# Patient Record
Sex: Male | Born: 1950
Health system: Southern US, Community
[De-identification: ages and names within clinical notes are randomized; demographics above are authoritative.]

## PROBLEM LIST (undated history)

## (undated) ENCOUNTER — Emergency Department (HOSPITAL_COMMUNITY): Discharge status: Against Medical Advice | Payer: BC Managed Care – PPO

## (undated) DIAGNOSIS — D573 Sickle-cell trait: Secondary | ICD-10-CM

## (undated) DIAGNOSIS — K219 Gastro-esophageal reflux disease without esophagitis: Secondary | ICD-10-CM

## (undated) DIAGNOSIS — Z87442 Personal history of urinary calculi: Secondary | ICD-10-CM

## (undated) DIAGNOSIS — E119 Type 2 diabetes mellitus without complications: Secondary | ICD-10-CM

## (undated) DIAGNOSIS — I10 Essential (primary) hypertension: Secondary | ICD-10-CM

## (undated) DIAGNOSIS — K7581 Nonalcoholic steatohepatitis (NASH): Secondary | ICD-10-CM

## (undated) DIAGNOSIS — N200 Calculus of kidney: Secondary | ICD-10-CM

## (undated) DIAGNOSIS — K589 Irritable bowel syndrome without diarrhea: Secondary | ICD-10-CM

## (undated) DIAGNOSIS — B1920 Unspecified viral hepatitis C without hepatic coma: Secondary | ICD-10-CM

## (undated) DIAGNOSIS — N289 Disorder of kidney and ureter, unspecified: Secondary | ICD-10-CM

## (undated) DIAGNOSIS — G4700 Insomnia, unspecified: Secondary | ICD-10-CM

## (undated) HISTORY — PX: BACK SURGERY: SHX140

## (undated) HISTORY — DX: Sickle-cell trait: D57.3

## (undated) HISTORY — DX: Gastro-esophageal reflux disease without esophagitis: K21.9

## (undated) HISTORY — DX: Type 2 diabetes mellitus without complications: E11.9

## (undated) HISTORY — PX: KIDNEY STONE SURGERY: SHX686

## (undated) HISTORY — DX: Insomnia, unspecified: G47.00

## (undated) HISTORY — DX: Nonalcoholic steatohepatitis (NASH): K75.81

## (undated) HISTORY — PX: SHOULDER SURGERY: SHX246

## (undated) HISTORY — DX: Irritable bowel syndrome, unspecified: K58.9

## (undated) HISTORY — DX: Unspecified viral hepatitis C without hepatic coma: B19.20

## (undated) HISTORY — PX: CHOLECYSTECTOMY: SHX55

## (undated) HISTORY — DX: Calculus of kidney: N20.0

---

## 2000-10-24 ENCOUNTER — Ambulatory Visit (HOSPITAL_COMMUNITY): Admission: RE | Admit: 2000-10-24 | Discharge: 2000-10-24 | Payer: Self-pay | Admitting: Internal Medicine

## 2000-10-24 ENCOUNTER — Encounter: Payer: Self-pay | Admitting: Internal Medicine

## 2000-10-25 ENCOUNTER — Emergency Department (HOSPITAL_COMMUNITY): Admission: EM | Admit: 2000-10-25 | Discharge: 2000-10-25 | Payer: Self-pay | Admitting: Emergency Medicine

## 2000-10-25 ENCOUNTER — Encounter: Payer: Self-pay | Admitting: Emergency Medicine

## 2000-10-27 ENCOUNTER — Encounter: Payer: Self-pay | Admitting: General Surgery

## 2000-10-27 ENCOUNTER — Ambulatory Visit (HOSPITAL_COMMUNITY): Admission: RE | Admit: 2000-10-27 | Discharge: 2000-10-27 | Payer: Self-pay | Admitting: Pediatrics

## 2000-11-19 ENCOUNTER — Encounter: Payer: Self-pay | Admitting: General Surgery

## 2000-11-19 ENCOUNTER — Ambulatory Visit (HOSPITAL_COMMUNITY): Admission: RE | Admit: 2000-11-19 | Discharge: 2000-11-19 | Payer: Self-pay | Admitting: Pediatrics

## 2000-11-24 ENCOUNTER — Observation Stay (HOSPITAL_COMMUNITY): Admission: RE | Admit: 2000-11-24 | Discharge: 2000-11-25 | Payer: Self-pay | Admitting: Pediatrics

## 2001-02-27 ENCOUNTER — Emergency Department (HOSPITAL_COMMUNITY): Admission: EM | Admit: 2001-02-27 | Discharge: 2001-02-27 | Payer: Self-pay | Admitting: Internal Medicine

## 2002-02-05 ENCOUNTER — Ambulatory Visit (HOSPITAL_COMMUNITY): Admission: RE | Admit: 2002-02-05 | Discharge: 2002-02-05 | Payer: Self-pay | Admitting: Internal Medicine

## 2002-02-05 HISTORY — PX: COLONOSCOPY: SHX174

## 2003-05-23 ENCOUNTER — Ambulatory Visit (HOSPITAL_COMMUNITY): Admission: RE | Admit: 2003-05-23 | Discharge: 2003-05-23 | Payer: Self-pay | Admitting: Family Medicine

## 2003-07-04 ENCOUNTER — Other Ambulatory Visit: Admission: RE | Admit: 2003-07-04 | Discharge: 2003-07-04 | Payer: Self-pay | Admitting: Dermatology

## 2005-01-17 ENCOUNTER — Emergency Department (HOSPITAL_COMMUNITY): Admission: EM | Admit: 2005-01-17 | Discharge: 2005-01-17 | Payer: Self-pay | Admitting: Emergency Medicine

## 2005-01-19 ENCOUNTER — Inpatient Hospital Stay (HOSPITAL_COMMUNITY): Admission: EM | Admit: 2005-01-19 | Discharge: 2005-01-23 | Payer: Self-pay | Admitting: Emergency Medicine

## 2005-04-02 ENCOUNTER — Ambulatory Visit: Payer: Self-pay | Admitting: Internal Medicine

## 2005-05-14 ENCOUNTER — Ambulatory Visit: Payer: Self-pay | Admitting: Internal Medicine

## 2005-05-17 ENCOUNTER — Ambulatory Visit: Payer: Self-pay | Admitting: Internal Medicine

## 2005-05-17 ENCOUNTER — Ambulatory Visit (HOSPITAL_COMMUNITY): Admission: RE | Admit: 2005-05-17 | Discharge: 2005-05-17 | Payer: Self-pay | Admitting: Internal Medicine

## 2005-05-17 HISTORY — PX: ESOPHAGOGASTRODUODENOSCOPY: SHX1529

## 2005-08-26 ENCOUNTER — Ambulatory Visit: Payer: Self-pay | Admitting: Internal Medicine

## 2005-10-02 ENCOUNTER — Ambulatory Visit: Payer: Self-pay | Admitting: Internal Medicine

## 2006-08-08 ENCOUNTER — Ambulatory Visit (HOSPITAL_COMMUNITY): Admission: RE | Admit: 2006-08-08 | Discharge: 2006-08-08 | Payer: Self-pay | Admitting: Family Medicine

## 2006-10-31 ENCOUNTER — Ambulatory Visit: Payer: Self-pay | Admitting: Internal Medicine

## 2008-01-13 ENCOUNTER — Ambulatory Visit (HOSPITAL_COMMUNITY): Admission: RE | Admit: 2008-01-13 | Discharge: 2008-01-13 | Payer: Self-pay | Admitting: Internal Medicine

## 2008-03-13 ENCOUNTER — Emergency Department (HOSPITAL_COMMUNITY): Admission: EM | Admit: 2008-03-13 | Discharge: 2008-03-13 | Payer: Self-pay | Admitting: Emergency Medicine

## 2008-08-23 ENCOUNTER — Ambulatory Visit (HOSPITAL_COMMUNITY): Admission: RE | Admit: 2008-08-23 | Discharge: 2008-08-23 | Payer: Self-pay | Admitting: Urology

## 2008-08-25 ENCOUNTER — Ambulatory Visit (HOSPITAL_COMMUNITY): Admission: RE | Admit: 2008-08-25 | Discharge: 2008-08-25 | Payer: Self-pay | Admitting: Urology

## 2008-08-25 ENCOUNTER — Encounter (INDEPENDENT_AMBULATORY_CARE_PROVIDER_SITE_OTHER): Payer: Self-pay | Admitting: Urology

## 2010-08-04 ENCOUNTER — Encounter: Payer: Self-pay | Admitting: Family Medicine

## 2010-10-30 LAB — CBC
MCV: 89.6 fL (ref 78.0–100.0)
Platelets: 193 10*3/uL (ref 150–400)
RBC: 5 MIL/uL (ref 4.22–5.81)
RDW: 12.6 % (ref 11.5–15.5)

## 2010-10-30 LAB — BASIC METABOLIC PANEL
Creatinine, Ser: 1.65 mg/dL — ABNORMAL HIGH (ref 0.4–1.5)
GFR calc non Af Amer: 43 mL/min — ABNORMAL LOW (ref >60)
Sodium: 137 mEq/L (ref 135–145)

## 2010-10-30 LAB — STONE ANALYSIS: Stone Weight KSTONE: 0.2 g

## 2010-11-27 NOTE — Op Note (Signed)
NAME:  Stephen Stephenson, HALLENBECK NO.:  [id number]   MEDICAL RECORD NO.:  [id number]          PATIENT TYPE:  AMB   LOCATION:  DAY                           FACILITY:  [facility]   PHYSICIAN:  Miguel Dibble, M.D.   DATE OF BIRTH:  1951/04/30   DATE OF PROCEDURE:  08/25/2008  DATE OF DISCHARGE:                               OPERATIVE REPORT   PREOPERATIVE DIAGNOSES:  Left distal ureteral calculus with obstruction,  left hydronephrosis, left renal colic.   POSTOPERATIVE DIAGNOSES:  Left distal ureteral calculus with  obstruction, left hydronephrosis, left renal colic.   OPERATIVE PROCEDURES:  Cystoscopy, left retrograde pyelogram, left  ureteroscopy, stone extraction, and left ureteral stent placement.   ANESTHESIA:  General.   SURGEON:  Miguel Dibble, MD   COMPLICATIONS:  None.   ESTIMATED BLOOD LOSS:  Minimal.   DRAINS:  6.26 cm size left ureteral stent with a string.   SPECIMEN:  Left ureteral calculus which was sent for chemical analysis.   INDICATIONS FOR THE PROCEDURE:  This 60 year old male has a history of  recurrent urolithiasis.  He was evaluated for abdominal left flank pain  recently.  His x-rays revealed a 9 x 6 mm size left distal ureteral  calculus with obstruction of hydronephrosis.  The patient was unable to  pass the stone.  He was taken to the operating room today for  cystoscopy, left retrograde pyelogram, left ureteroscopy, stone  extraction with possible holmium laser and left ureteral stent  placement.   DESCRIPTION OF THE PROCEDURE:  General anesthesia was induced and the  patient was placed on the OR table in the dorsal lithotomy position.  The lower abdomen and genitalia were prepped and draped in a sterile  fashion.  Cystoscopy was done with the 20-French scope.  The urethra was  normal.  The prostate was normal.  The bladder was examined and found to  be normal.  There was prominence of left intramural ureter suggestive of  an underlying  stone.  A 5-French wedge catheter was then placed in the  left ureteral orifice.  About 7 mL of indigo carmine was injected to the  collecting system and the left retrograde pyelogram was done with C-arm  fluoroscopy.  This revealed a large filling defect in the intramural  ureter about 9 x 6 mm in size.  There is proximal hydroureter and  hydronephrosis.   A 5-French open-ended catheter was then placed in the left ureteral  orifice.  A 0.038-gauge Bentson guidewire could not be passed beyond the  level of the stone.  A Glidewire could be passed into the upper  collecting system.  The open-ended catheter was then advanced to the  level above the stone.  The Glidewire was then replaced with a Bentson  guidewire.  Distal ureter was then dilated using a 18-French balloon  dilating catheter.  The balloon dilating catheter was then removed  leaving the guidewire in place.   Ureteroscopy was done with a 8-point French rigid ureteroscope.  A 9 mm  size stone was seen in the distal ureter.  The ureter was found  to be  dilated adequately.  A nitinol 0 wire basket was reinserted into the  left distal ureter.  The stone was engaged in the basket and removed  without any difficulty.  Examination of ureter up to the pelvic brim was  negative.  A 6.26 cm size stent was then inserted into the left  collecting system.  The instruments were removed.  The patient was  transferred to the PACU in a satisfactory condition.      Miguel Dibble, M.D.  Electronically Signed     SK/MEDQ  D:  08/25/2008  T:  08/26/2008  Job:  [id number]   cc:   Leonides Grills, M.D.  Fax: [phone]

## 2010-11-27 NOTE — H&P (Signed)
NAME:  SAHAJ, BONA NO.:  [id number]   MEDICAL RECORD NO.:  [id number]          PATIENT TYPE:  AMB   LOCATION:  DAY                           FACILITY:  [facility]   PHYSICIAN:  Miguel Dibble, M.D.   DATE OF BIRTH:  04/10/1951   DATE OF ADMISSION:  08/25/2008  DATE OF DISCHARGE:  LH                              HISTORY & PHYSICAL   He is coming to the short-stay center on August 25, 2008.   CHIEF COMPLAINT:  Left distal ureteral calculus with obstruction,  abdominal pain, left hydronephrosis.   HISTORY OF PRESENT ILLNESS:  This 60 year old male has a history of  recurrent urolithiasis in the past.  He has undergone ureteroscopy and  stone extraction in 2006.   He has been having intermittent bilateral flank pain of moderate  severity for about a week.  He also has sharp diffuse abdominal pain as  well as nausea.  There is no history of voiding difficulty or hematuria.  A noncontrast CT scan of abdomen and pelvis revealed an 8-mm size left  distal ureteral calculus obstruction and left hydronephrosis and  hydroureter.  The patient was brought to the short-stay center today for  cystoscopy, left retrograde pyelogram, left ureteroscopy, holmium laser  lithotripsy, stone extraction and stent placement.   PAST MEDICAL HISTORY:  1. Recurrent urolithiasis, status post ureteroscopy and stone      extraction in 2006.  2. Diabetes mellitus.  3. Status post back surgery.  4. Status post cholecystectomy.  5. Status post shoulder surgery.   MEDICATIONS:  Glimepiride 2 mg p.o. daily   ALLERGIES:  None.   EXAMINATION:  Height 6 feet, weight 220 pounds.  HEAD, EYES, EARS, NOSE AND THROAT:  Normal.  LUNGS:  Clear to auscultation.  HEART:  Regular rate and rhythm.  No murmurs.  ABDOMEN:  Soft, no palpable flank mass or CVA tenderness.  Bladder is  not palpable.  Penis and testes are normal.  RECTAL EXAMINATION:  30 g essentially benign prostate.   CT scan of abdomen  and pelvis without contrast revealed chronic  perirenal stranding on the right side.  There was no obstruction.  Left  hydronephrosis and hydroureter due to obstructing 8-mm size stone in the  distal ureter at the level of ureterovesical junction was noted.   IMPRESSION:  1. Left distal ureteral calculus with obstruction.  2. Bilateral flank pain.  3. Left hydronephrosis and hydroureter.   PLAN:  Cystoscopy, left retrograde pyelogram, left ureteroscopy, holmium  laser lithotripsy, stone extraction and stent placement in short-stay  center at [facility].  I have discussed with the patient  regarding the diagnosis, operative details, alternative treatments,  outcome, possible risks and complications, and he has agreed for the  procedure to be done.      Miguel Dibble, M.D.  Electronically Signed     SK/MEDQ  D:  08/25/2008  T:  08/25/2008  Job:  [id number]   cc:   Leonides Grills, M.D.  Fax: [phone]

## 2010-11-30 NOTE — Op Note (Signed)
NAME:  Stephen Stephenson, Stephen Stephenson NO.:  [id number]   MEDICAL RECORD NO.:  [id number]          PATIENT TYPE:  INP   LOCATION:  A326                          FACILITY:  [facility]   PHYSICIAN:  Miguel Dibble, M.D.   DATE OF BIRTH:  Sep 08, 1950   DATE OF PROCEDURE:  01/21/2005  DATE OF DISCHARGE:                                 OPERATIVE REPORT   PREOPERATIVE DIAGNOSES:  1.  Left distal ureteral calculus with obstruction.  2.  Left renal colic.  3.  Left hydronephrosis.   POSTOPERATIVE DIAGNOSES:  1.  Left distal ureteral calculus with obstruction.  2.  Left renal colic.  3.  Left hydronephrosis.   OPERATIVE PROCEDURES:  1.  Cystoscopy.  2.  Left retrograde pyelogram.  3.  Left ureteroscopy.  4.  Stone extraction.  5.  Left ureteral stent placement.   ANESTHESIA:  General.   SURGEON:  Miguel Dibble, M.D.   COMPLICATIONS:  None.   INDICATIONS FOR PROCEDURE:  This 60 year old male was admitted to hospital  with severe left flank pain. Evaluation revealed a 2-mm sized left distal  ureteral calculi, with obstruction and mild hydronephrosis. The patient was  unable to pass the stone and he had severe persistent pain. He was taken to  the OR today for cystoscopy, left retrograde pyelogram, left ureteroscopy,  stone extraction and left ureteral stent placement.   DESCRIPTION OF PROCEDURE:  General anesthesia was induced and the patient  was placed on the OR table in the dorsolithotomy position. The lower abdomen  and genitalia were prepped and draped in a sterile fashion. Cystoscopy was  done with a 24-French scope. The urethra, prostate and bladder were normal.  A 5-French catheter was then placed in left ureteral orifice. Then 7 cc of  Renograffin was injected into the collecting system and a retrograde  pyelogram was done. This revealed a small filling defect in the left  intramural ureter with proximal hydroureter.   The 5-French open-ended catheter was then placed the  left ureteral orifice.  I was unable to pass a 0.038 inch Britta Mccreedy guidewire due to the stone  obstructing the distal ureter.   The cystoscope was removed. A rigid 8.5-French ureteroscope was then  inserted into the bladder. Under direct vision in the distal ureter, the  guidewire was passed into the upper collecting system. Ureteroscope was then  removed. The distal ureter was then dilated using a  15-French balloon  dilating catheter. Balloon dilating catheter was then removed, leaving the  guidewire in place.   The ureteroscope was inserted into the distal ureter. The stone was seen in  the intramural ureter. The stone was extracted with a Nitinol wire basket  without any difficulty. A 6-French 26 cm stent with a string was then  inserted to the left collecting system. The instruments were removed. The  patient was transferred to the PACU in a satisfactory condition.       SK/MEDQ  D:  01/21/2005  T:  01/21/2005  Job:  [id number]

## 2010-11-30 NOTE — Op Note (Signed)
NAME:  CORNELLIUS, KROPP                ACCOUNT NO.:  [id number]   MEDICAL RECORD NO.:  [id number]          PATIENT TYPE:  AMB   LOCATION:  DAY                           FACILITY:  [facility]   PHYSICIAN:  R. Garfield Cornea, M.D. DATE OF BIRTH:  1951-06-21   DATE OF PROCEDURE:  05/17/2005  DATE OF DISCHARGE:                                 OPERATIVE REPORT   PROCEDURE:  Diagnostic esophagogastroduodenoscopy.   INDICATIONS FOR PROCEDURE:  The patient is a 60 year old gentleman with long-  standing gastroesophageal reflux disease symptoms, only moderately  controlled with Aciphex 20 mg orally daily.   He is a good 25 pounds over his ideal body weight. EGD is now being done to  further evaluate for potential complicated GERD. This approach has been  discussed with the patient at length. Potential risks, benefits, and  alternatives have been reviewed and questions answered. Please see  documentation in the medical record.   PROCEDURE NOTE:  O2 saturation, blood pressure, pulse, and respirations were  monitored throughout the entire procedure. Conscious sedation with Versed 4  mg IV and Demerol 100 mg IV in divided doses. Cetacaine spray for topical  oropharyngeal anesthesia.   INSTRUMENT:  Olympus video chip system.   FINDINGS:  Examination of the tubular esophagus revealed no mucosa  abnormalities. EG junction was easily traversed.   Stomach:  Gastric cavity was empty and insufflated well with air. Thorough  examination of gastric mucosa including retroflexed view of the proximal  stomach and esophagogastric junction demonstrated minimal snake-skinning,  reticulated pattern of the mucosa along the greater curvature. There was no  infiltrating process apparent. No ulcer or other abnormality. There was no  hernia. Pylorus patent and easily traversed. Examination of bulb and second  portion revealed no abnormalities.   THERAPEUTIC/DIAGNOSTIC MANEUVERS:  None.   The patient tolerated the  procedure well and was reactive to endoscopy.   IMPRESSION:  Normal esophagus. Minimal snake-skinning, reticulated pattern  of the mucosa in the area of the greater curvature. A nonspecific finding,  but I doubt this gentleman has advanced chronic liver disease and do not  believe this is indicative of portal gastropathy or any other significant  process. The remainder of the gastric mucosa appeared normal. Patent  pylorus. Normal D1 and D2.   RECOMMENDATIONS:  1.  Weight loss.  2.  Anti-reflux measures/literature provided to Mr. Creary.  3.  Increase Aciphex to 20 mg orally twice daily, before breakfast and      supper.  4.  Followup appointment with [place] in three months.      Bridgette Habermann, M.D.  Electronically Signed     RMR/MEDQ  D:  05/17/2005  T:  05/17/2005  Job:  [id number]   cc:   Halford Chessman, M.D.  Fax: [phone]

## 2010-11-30 NOTE — Op Note (Signed)
NAME:  Stephen Stephenson, Stephen Stephenson NO.:  [id number]   MEDICAL RECORD NO.:  [id number]                   PATIENT TYPE:  AMB   LOCATION:                                       FACILITY:  [facility]   PHYSICIAN:  Cristopher Estimable. Rourk, M.D.               DATE OF BIRTH:  10-Sep-1950   DATE OF PROCEDURE:  02/05/2002  DATE OF DISCHARGE:                                  PROCEDURE NOTE   PROCEDURE:  Diagnostic Colonoscopy   INDICATIONS FOR PROCEDURE:  The patient is a 60 year old gentleman with  chronic hepatitis C infection currently undergoing antiviral therapy. He  noticed some gross blood per rectum recently.  He typically moves his bowels  once a day only he has not had chronic hematochezia, although he had one  noticeable episode recently.  Colonoscopy is now being done to further  evaluate his symptoms.  This approach has been discussed with [name]  previously and again today at the bedside. The potential risks, benefits,  and alternatives have been reviewed, questions answered and he is agreeable.  Please see my documentation in the medical record for more information.  He  is low risk for conscious sedation.   PROCEDURE NOTE:  O2 saturation, blood pressure, pulse and respirations were  monitored throughout the entire procedure.   CONSCIOUS SEDATION:  Versed 4 mg IV in divided doses, Demerol 25 mg IV   INSTRUMENT USED:  Olympus video chip colonoscope   FINDINGS:  Digital rectal examination revealed no abnormalities.   ENDOSCOPIC FINDINGS:  The prep was good.   RECTUM:  Examination of rectal mucosa including retroflexed view at anal  verge and on colonoscopic exam demonstrated anal canal hemorrhoids,  otherwise the rectum mucosa appeared normal.   COLON:  Colonic mucosa was surveyed from the rectosigmoid junction thru the  left, transverse, and right colon to the area of the appendiceal orifice and  ileocecal valve and cecum.  These structures were well-seen,  photographed  for record, there was a tiny 3 mm AVM in the cecum, however, the remainder  of the colonic mucosa appeared  normal.  The terminal ileum was intubated at  10 cm.  This segment of the GI tract appeared normal from the level of the  cecum to the ileocecal valve.  The scope was slowly withdrawn.  All  previously mentioned mucosal surfaces were again seen.  No other  abnormalities were observed.  The patient tolerated the procedure well and  was reactivated.   ENDOSCOPY IMPRESSION:  1. Anal canal hemorrhoids, otherwise normal rectum.  2. Tiny arteriovenous malformation in the cecum, I doubt no clinical     significance; otherwise, the colonic mucosa appeared normal.  Normal     terminal ileum.  I suspect patient bled from hemorrhoids presently.    RECOMMENDATIONS:  1. Hemorrhoid literature was provided to Mr. Ran.  2. A 10-day course  of Anusol HC suppositories, one per rectum at bedtime x     10 days.  3. Follow up with me in the next couple of months as scheduled for     monitoring of his anti-HCV therapy.                                               Cristopher Estimable. Rourk, M.D.    RMR/MEDQ  D:  02/05/2002  T:  02/11/2002  Job:  [id number]   cc:   Virgel Paling, M.D.

## 2010-11-30 NOTE — H&P (Signed)
NAME:  Stephen Stephenson, Stephen Stephenson NO.:  [id number]   MEDICAL RECORD NO.:  [id number]          PATIENT TYPE:  INP   LOCATION:  A326                          FACILITY:  [facility]   PHYSICIAN:  Miguel Dibble, M.D.   DATE OF BIRTH:  29-Mar-1951   DATE OF ADMISSION:  01/19/2005  DATE OF DISCHARGE:  LH                                HISTORY & PHYSICAL   CHIEF COMPLAINT:  Severe left flank pain radiating to the front, nausea,  vomiting.   HISTORY OF PRESENT ILLNESS:  This 60 year old male has been experiencing  sudden onset of severe left flank pain radiating to the front for the past 3-  4 days.  This is associated with nausea and vomiting.  The patient denied  having any fevers, chills, voiding difficulty or gross hematuria.  There is  no past history of urolithiasis.  He has been to the emergency room at [facility] times two.  He has been evaluated with a noncontrast CT scan  of the abdomen and pelvis.  This revealed a 2-mm-sized left distal ureteral  calculus with obstruction and mild hydronephrosis.  The patient was unable  to pass the stone.  He had severe persistent pain and nausea.  He came back  to the emergency room on January 19, 2005.  His pain was not adequately  controlled in the emergency room.  He has been admitted to the hospital for  IV fluids, pain control and further treatment.   PAST MEDICAL HISTORY:  No medical illnesses, status post back surgery,  status post cholecystectomy, status post shoulder surgery.   MEDICATIONS:  None.   ALLERGIES:  None.   PHYSICAL EXAMINATION:  GENERAL APPEARANCE:  The patient is in moderate pain.  HEENT:  Head, eyes, ears, nose and throat are normal.  NECK:  No masses.  LUNGS:  Clear to auscultation.  HEART:  Regular rate and rhythm.  No murmurs.  ABDOMEN:  Soft.  No palpable flank mass.  Moderate left costovertebral angle  tenderness was noted.  Bladder is not      palpable.  No suprapubic tenderness.  GENITOURINARY:   Penis and testes are normal.  RECTAL:  Examination not done.   LABORATORY:  CBC:  WBC 10.3, hemoglobin 17.7, hematocrit 47.6.  BUN 22,  creatinine 1.3.  Urinalysis revealed large blood, nitrite negative,  leukocytes negative.  WBCs rare, RBCs too numerous to count.  Urine culture  and sensitivity revealed no growth.   IMPRESSION:  1.  Left distal ureteral calculus without obstruction.  2.  Left renal colic.  3.  Left hydronephrosis.   PLAN:  1.  Admit the patient to the hospital.  2.  IV fluids.  3.  Parenteral narcotics.  4.  Discussed with the patient regarding management options.  He would like      to wait for spontaneous passage of the calculus.       SK/MEDQ  D:  01/20/2005  T:  01/20/2005  Job:  [id number]

## 2010-11-30 NOTE — H&P (Signed)
NAME:  Stephen Stephenson, Stephen Stephenson                ACCOUNT NO.:  [id number]   MEDICAL RECORD NO.:  [id number]           PATIENT TYPE:  AMB   LOCATION:                                FACILITY:  [facility]   PHYSICIAN:  R. Garfield Cornea, M.D.      DATE OF BIRTH:   DATE OF ADMISSION:  DATE OF DISCHARGE:  LH                                HISTORY & PHYSICAL   CHIEF COMPLAINT:  Reflux.   HISTORY OF PRESENT ILLNESS:  [name] is a pleasant, 60 year old  gentleman with over a 5-year history of typical heartburn symptoms which  have only been somewhat blunted with Aciphex 20 mg orally daily.  He is a  good 25 pounds over his ideal body weight range.  He was having some  abdominal previously, and ended up having a kidney stones and had a stent.  He was seen by Dr. Maryland Pink.  He got a lithotripsy and now is doing well.  He does have occasional gas pains with BM.  He uses Levsin sublingually  occasionally with good results.  He had a normal colonoscopy in 2003.  He  does not have any odynophagia or dysphagia.  He has never had his upper GI  tract directly visualized. His gallbladder is out.   PAST MEDICAL HISTORY:  1.  Significant for hepatitis C, genotype 1A. Status post eradication with      antiviral therapy.  2.  History of type 2 diabetes mellitus.  3.  Gastroesophageal reflux disease.  4.  IBS.  5.  He has chronic insomnia.   PAST HISTORY:  As outlined above, he is status post shoulder surgery and  back surgery as well.   FAMILY HISTORY:  Noncontributory.   SOCIAL HISTORY:  The patient does not consume alcohol.  He does not use  tobacco products.  He is married with children.   REVIEW OF SYSTEMS:  As in history of present illness.   PHYSICAL EXAMINATION:  GENERAL:  Reveals a pleasant, 60 year old gentleman.  VITAL SIGNS:  Weight 222, height 6 feet.  Temperature 97.4, BP 124/80, pulse  92.  SKIN:  Warm and dry.  CHEST:  Lungs are clear to auscultation.  CARDIAC:  Regular rate and rhythm  without murmur, gallop, or rub.  BREASTS EXAM:  Deferred.  ABDOMEN:  Nondistended, positive bowel sounds.  Soft, nontender, without  appreciable mass or organomegaly.  EXTREMITIES:  No edema.   ADMITTING IMPRESSION:  [name] is a 31 year old gentleman with a  several year history of gastroesophageal reflux disease symptoms with only a  partial response to aspirin suppression therapy in the way of Aciphex.  Although he really does not have any alarming features he needs to have  direct visualization of his upper GI tract.  To this extent I have offered  him an EGD.  This needs to be done in the near future.  His questions were  answered.  He is agreeable.  We noted that he was a good 20-25 pounds over  his ideal body weight.  I suggested a 10-15 pound weight  loss in the next 6  months would be a great assistance in managing his gastroesophageal reflux  disease and may help his diabetes as well.   For his insomnia, he is undergoing a sleep study in the near future.  He may  have sleep apnea. Weight loss could also potentially combat that problem if  it is proven to be present.  We will make further recommendations in the  very near future.  I have refilled his Levsin sublingual today, and given  him samples of Aciphex to bridge him until the time of upper endoscopy.   I would like to thank Dr. Sharilyn Sites for allowing me to see this nice  gentleman, once again.      Bridgette Habermann, M.D.  Electronically Signed     RMR/MEDQ  D:  05/14/2005  T:  05/14/2005  Job:  [id number]   cc:   Halford Chessman, M.D.  Fax: [phone]

## 2010-11-30 NOTE — Discharge Summary (Signed)
NAME:  Stephen Stephenson, Stephen Stephenson NO.:  [id number]   MEDICAL RECORD NO.:  [id number]          PATIENT TYPE:  INP   LOCATION:  A326                          FACILITY:  [facility]   PHYSICIAN:  Miguel Dibble, M.D.   DATE OF BIRTH:  Sep 12, 1950   DATE OF ADMISSION:  01/19/2005  DATE OF DISCHARGE:  07/12/2006LH                                 DISCHARGE SUMMARY   CONSULTING PHYSICIAN:  [facility]   FINAL DIAGNOSES:  Left distal ureteral calculus.   OTHER DIAGNOSES:  Left renal colic and left hydronephrosis.   OPERATIVE PROCEDURE:  Cystoscopy, left retrograde pyelogram, left  urethroscopy with stone extraction, and left uretal stent placement done on  January 21, 2005.   COMPLICATIONS:  None.   DISCHARGE SUMMARY:  This 60 year old male was having sudden onset of severe  flank pain radiating to the front for about three to four days.  This was  associated with nausea and vomiting.  The patient denied having fevers,  chills, voiding difficulty, or gross hematuria.  There was no past history  of urolithiasis.  He had been to the emergency room at [facility]  x2.  He was evaluated with a noncontrast CT scan of abdomen and pelvis.  This revealed a 2 mm sized left distal ureteral calculus with obstruction  and mild hydronephrosis.  Patient was unable to pass the stone.  He had  severe persistent pain and nausea.  He came back to the emergency room on  January 19, 2005.  His pain was not adequately controlled in the emergency room.  He was admitted to the hospital for IV fluids, pain control, and further  treatment.   PAST MEDICAL HISTORY:  No medical illnesses.  Status post back surgery.  Status post cholecystectomy.  Status post shoulder surgery.   MEDICATIONS:  None.   ALLERGIES:  None.   PHYSICAL EXAMINATION:  GENERAL:  Patient in moderate pain.  HEENT:  Normal.  NECK:  No masses.  LUNGS:  Clear to auscultation.  HEART:  Regular rate and rhythm.  No  murmurs.  ABDOMEN:  Soft.  No palpable flank mass.  Moderate left costovertebral angle  tenderness was noted.  Bladder was not palpable.  No suprapubic tenderness.  GENITOURINARY:  Penis and testes are normal.  RECTAL:  Not done.   ADMISSION LABORATORIES:  CBC:  WBC 10.3, hemoglobin 17.7, hematocrit 47.6.  BUN 22, creatinine 1.3.  Urinalysis revealed large blood, nitrate negative,  leukocytes negative, wbc's rare, rbc's too numerous to count.  Urine culture  and sensitivity revealed no growth.   HOSPITAL COURSE:  The patient was admitted to the hospital and treated with  IV fluids and parenteral narcotics.  I discussed with the patient about the  treatment options.  As the stone is small he wanted to wait and see whether  he can pass the stone spontaneously.  He had severe intermittent pain which  was adequately controlled with PCA morphine.  The patient also had hiccups  for which he was seen by [facility].  The patient was unable  to pass the  stone.  After counseling he was taken to the OR on January 21, 2005  and underwent cystoscopy, left retrograde pyelogram, left urethroscopy with  stone extraction, and left uretal stent placement.  The patient had hiccups  and he also had left lower quadrant abdominal pain.  With the possible  diagnosis of diverticulitis he underwent evaluation of the CT of the abdomen  and pelvis with contrast.  In the postoperative period his glucose was found  to be high, 159.  He was started on Amaryl by [facility].  His pain was adequately controlled with oral pain medications and the PCA  pump was discontinued.  The CT scan of the abdomen and pelvis revealed mild  left hydronephrosis and perinephric inflammation.  There was no evidence of  diverticulitis.  Patient had a 4 x 2 mm sized left renal stone.  Left  ureteral stone was in good position.  On January 23, 2005 the patient had good  relief of pain and he was voiding without any  difficulty.  Also had relief  of nausea and abdominal discomfort.  His laboratories were within normal  range.  His glucose was 135.  Renal function was normal.  BUN 11, creatinine  0.9.  Patient was discharged on January 23, 2005.  He will return to the office  on January 25, 2005 for stent removal.   DISCHARGE MEDICATIONS:  1.  Cipro 500 mg p.o. b.i.d. for five days.  2.  Tylenol with codeine No.3 one p.o. q.8h. p.r.n. pain #20.  3.  Amaryl 200 mg one p.o. daily.   He was advised to call me for fever, chills, voiding difficulty, or gross  hematuria.  He will see [facility] for follow-up of new  onset diabetes mellitus.   CONDITION ON DISCHARGE:  Stable.   FINAL DIAGNOSES:  1.  Left distal ureteral calculus with obstruction.  2.  Left hydronephrosis.  3.  Left renal colic.  4.  New onset diabetes mellitus.       SK/MEDQ  D:  02/06/2005  T:  02/06/2005  Job:  [id number]

## 2011-05-10 ENCOUNTER — Ambulatory Visit (INDEPENDENT_AMBULATORY_CARE_PROVIDER_SITE_OTHER): Payer: BC Managed Care – PPO | Admitting: Internal Medicine

## 2011-05-10 VITALS — BP 135/89 | HR 72 | Temp 97.1°F | Ht 72.0 in | Wt 215.4 lb

## 2011-05-10 DIAGNOSIS — K219 Gastro-esophageal reflux disease without esophagitis: Secondary | ICD-10-CM

## 2011-05-10 NOTE — Patient Instructions (Signed)
Trial of Dexilant 60 mg daily  - -samples provided  Acid reflux information sheet provided.  Call me in 3-4 weeks and let me know how the Dexilant Is working for you and we will go from there.

## 2011-05-16 ENCOUNTER — Encounter: Payer: Self-pay | Admitting: Internal Medicine

## 2011-05-21 ENCOUNTER — Encounter: Payer: Self-pay | Admitting: Internal Medicine

## 2011-05-21 DIAGNOSIS — K219 Gastro-esophageal reflux disease without esophagitis: Secondary | ICD-10-CM | POA: Insufficient documentation

## 2011-05-21 NOTE — Assessment & Plan Note (Signed)
Flareup of GERD currently on no acid supression therapy. No alarm symptoms.  Recommendations: Reviewed the multipronged approach to treatment of GERD. Specifically reviewed diet.  Trial of Dexilant 60 mg daily samples provided. He will let us know how he is  doing in 3 weeks.  Plan for average risk screening colonoscopy 2013.

## 2011-05-21 NOTE — Progress Notes (Signed)
Primary Care Physician:  [facility] Primary Gastroenterologist:  Dr. Jena Gauss  Pre-Procedure History & Physical: HPI:  Stephen Stephenson is a 60 y.o. male here for followup of GERD. Here for followup. Has not been seen in some time. Reflux symptoms have acted up some typical heartburn occasionally 2-3 times weekly. No acid suppression currently. Previously did well AcipHex. No weight loss no dysphagia no melena or hematochezia. Last EGD November 2006 demonstrated normal esophagus. Last colonoscopy for paper hematochezia back in July of 2003 demonstrated anal canal hemorrhoids at that time a tiny AVM in the cecum. He will be due for routine screening 2013.  Past Medical History  Diagnosis Date  . GERD (gastroesophageal reflux disease)   . Hepatitis C     genotype 1 eradicated  . Hb-AS genotype   . DM (diabetes mellitus)     type II  . IBS (irritable bowel syndrome)   . Insomnia   . Kidney stone     Past Surgical History  Procedure Date  . Shoulder surgery   . Back surgery   . Colonoscopy 02/05/02    anal canal herrhoids ow normal/normal ileum/suspect pt bled from hemorrhoids presently  . Esophagogastroduodenoscopy 05/17/05    normal esophagus/gastric mucosa appeared normal  . Kidney stone surgery     Prior to Admission medications   Not on File    Allergies as of 05/10/2011  . (No Known Allergies)    No family history on file.  History   Social History  . Marital Status: Married    Spouse Name: N/A    Number of Children: N/A  . Years of Education: N/A   Occupational History  . Not on file.   Social History Main Topics  . Smoking status: Never Smoker   . Smokeless tobacco: Not on file  . Alcohol Use: No  . Drug Use: No  . Sexually Active:    Other Topics Concern  . Not on file   Social History Narrative  . No narrative on file    Review of Systems: See HPI, otherwise negative ROS  Physical Exam: BP 135/89  Pulse 72  Temp(Src) 97.1 F (36.2 C) (Temporal)  Ht  6' (1.829 m)  Wt 215 lb 6.4 oz (97.705 kg)  BMI 29.21 kg/m2 General:   Alert,  Well-developed, well-nourished, pleasant and cooperative in NAD Head:  Normocephalic and atraumatic. Eyes:  Sclera clear, no icterus.   Conjunctiva pink. Lungs:  Clear throughout to auscultation.   No wheezes, crackles, or rhonchi. No acute distress. Heart:  Regular rate and rhythm; no murmurs, clicks, rubs,  or gallops. Abdomen:    Nondistended positive bowel sounds soft nontender without appreciable mass or hepatosplenomegaly Impression/Plan:

## 2011-06-04 ENCOUNTER — Telehealth: Payer: Self-pay | Admitting: Internal Medicine

## 2011-06-04 NOTE — Telephone Encounter (Signed)
Patient called an stated that her Dexilant samples is working well an needs a Rx sent to [facility] in [place] and is asking for a 90 day supply

## 2011-06-04 NOTE — Telephone Encounter (Signed)
Routed to refill box.

## 2011-06-05 MED ORDER — DEXLANSOPRAZOLE 60 MG PO CPDR: 60.0000 mg | DELAYED_RELEASE_CAPSULE | Freq: Every day | ORAL | Status: AC

## 2011-06-13 ENCOUNTER — Other Ambulatory Visit: Payer: Self-pay | Admitting: Gastroenterology

## 2011-06-13 MED ORDER — OMEPRAZOLE 20 MG PO CPDR: 20.0000 mg | DELAYED_RELEASE_CAPSULE | Freq: Every day | ORAL | Status: DC

## 2011-06-13 NOTE — Progress Notes (Signed)
Received PA for Dexilant. PT has not tried Prilosec or Protonix. Only Aciphex. This will not allow coverage.  Start Prilosec 20 mg po daily X 1 mos. Reassess if doing well. If not, try Protonix. Then, could do PA for Dexilant.

## 2011-07-02 ENCOUNTER — Telehealth: Payer: Self-pay

## 2011-07-02 NOTE — Telephone Encounter (Signed)
Pt called and said the Dexilant samples worked very well. But for some reason his insurance didn't cover the prescription. The Omeprazole he is taking does ok, but just does not work as well as the [employer]. He uses [facility] in [place], and his call back number is [phone] or work number is [phone]. ( Will route to [place] and see if she got a PA on this.)

## 2011-07-03 NOTE — Telephone Encounter (Signed)
PA paperwork on [facility] desk. Pt needed to try prilosec prior to getting dexilant.

## 2011-07-24 NOTE — Telephone Encounter (Signed)
Raynelle Fanning faxed to the pharmacy. He was approved.

## 2011-08-26 ENCOUNTER — Telehealth: Payer: Self-pay

## 2011-08-26 NOTE — Telephone Encounter (Addendum)
Gastroenterology Pre-Procedure Form   Pt  Received a letter to call to schedule next colonoscopy  Request Date: 08/26/2011            Requesting Physician: Jena Gauss     PATIENT INFORMATION:  Stephen Stephenson is a 61 y.o., male (DOB=1951-01-28).  PROCEDURE: Procedure(s) requested: colonoscopy Procedure Reason: screening for colon cancer  PATIENT REVIEW QUESTIONS: The patient reports the following:   1. Diabetes Melitis: yes  2. Joint replacements in the past 12 months: no 3. Major health problems in the past 3 months: no 4. Has an artificial valve or MVP:no 5. Has been advised in past to take antibiotics in advance of a procedure like teeth cleaning: no}    MEDICATIONS & ALLERGIES:    Patient reports the following regarding taking any blood thinners:   Plavix? no Aspirin?no Coumadin?  no  Patient confirms/reports the following medications:  Current Outpatient Prescriptions  Medication Sig Dispense Refill  . metFORMIN (GLUCOPHAGE) 500 MG tablet Take 500 mg by mouth 2 (two) times daily with a meal.      . omeprazole (PRILOSEC) 20 MG capsule Take 1 capsule (20 mg total) by mouth daily. 30 minutes before breakfast.  30 capsule  1    Patient confirms/reports the following allergies:  No Known Allergies  Patient is appropriate to schedule for requested procedure(s): yes  AUTHORIZATION INFORMATION Primary Insurance:   ID #:   Group #:  Pre-Cert / Auth required:  Pre-Cert / Auth #:   Secondary Insurance:   ID #:   Group #:  Pre-Cert / Auth required: Pre-Cert / Auth #:   No orders of the defined types were placed in this encounter.    SCHEDULE INFORMATION: Procedure has been scheduled as follows:  Date: 09/27/2011                   Time:  7:30 AM Location:[facility] Short Stay   This Gastroenterology Pre-Precedure Form is being routed to the following provider(s) for review: R. Roetta Sessions, MD  Pt to be called back later for early Friday appt  Rx and instructions  mailed.

## 2011-08-26 NOTE — Telephone Encounter (Signed)
Day of prep: metformin 250mg  bid.

## 2011-08-27 ENCOUNTER — Other Ambulatory Visit: Payer: Self-pay

## 2011-08-27 DIAGNOSIS — Z139 Encounter for screening, unspecified: Secondary | ICD-10-CM

## 2011-09-16 ENCOUNTER — Telehealth: Payer: Self-pay

## 2011-09-16 NOTE — Telephone Encounter (Signed)
Called pt to update triage prior to colonoscopy on 09/27/2011. He has not had any new medical problems. He is no longer taking the Omeprazole. He did have a change in his diabetic meds. He will call back with that information.

## 2011-09-17 NOTE — Telephone Encounter (Signed)
Pt called back to give name of his new diabetic medicine. Janumet 50/1000mg  One tablet twice daily.

## 2011-09-18 NOTE — Telephone Encounter (Signed)
Called and informed pt and also mailed new instructions.

## 2011-09-18 NOTE — Telephone Encounter (Signed)
Day of prep: Janumet 1/2 tab bid.

## 2011-09-24 ENCOUNTER — Encounter (HOSPITAL_COMMUNITY): Payer: Self-pay | Admitting: Pharmacy Technician

## 2011-09-26 MED ORDER — SODIUM CHLORIDE 0.45 % IV SOLN
Freq: Once | INTRAVENOUS | Status: AC
  Administered 2011-09-27: 07:00:00 via INTRAVENOUS

## 2011-09-27 ENCOUNTER — Encounter (HOSPITAL_COMMUNITY): Payer: Self-pay | Admitting: *Deleted

## 2011-09-27 ENCOUNTER — Ambulatory Visit (HOSPITAL_COMMUNITY)
Admission: RE | Admit: 2011-09-27 | Discharge: 2011-09-27 | Disposition: A | Discharge status: Home Health | Payer: BC Managed Care – PPO | Source: Ambulatory Visit | Attending: Internal Medicine | Admitting: Internal Medicine

## 2011-09-27 ENCOUNTER — Encounter (HOSPITAL_COMMUNITY)
Admission: RE | Disposition: A | Discharge status: Home Health | Payer: Self-pay | Source: Ambulatory Visit | Attending: Internal Medicine

## 2011-09-27 DIAGNOSIS — Z7982 Long term (current) use of aspirin: Secondary | ICD-10-CM | POA: Insufficient documentation

## 2011-09-27 DIAGNOSIS — K648 Other hemorrhoids: Secondary | ICD-10-CM

## 2011-09-27 DIAGNOSIS — Z01812 Encounter for preprocedural laboratory examination: Secondary | ICD-10-CM | POA: Insufficient documentation

## 2011-09-27 DIAGNOSIS — D126 Benign neoplasm of colon, unspecified: Secondary | ICD-10-CM

## 2011-09-27 DIAGNOSIS — Z1211 Encounter for screening for malignant neoplasm of colon: Secondary | ICD-10-CM | POA: Insufficient documentation

## 2011-09-27 DIAGNOSIS — E119 Type 2 diabetes mellitus without complications: Secondary | ICD-10-CM | POA: Insufficient documentation

## 2011-09-27 DIAGNOSIS — Z139 Encounter for screening, unspecified: Secondary | ICD-10-CM

## 2011-09-27 HISTORY — PX: COLONOSCOPY: SHX5424

## 2011-09-27 SURGERY — COLONOSCOPY
Anesthesia: Moderate Sedation

## 2011-09-27 MED ORDER — MEPERIDINE HCL 100 MG/ML IJ SOLN
INTRAMUSCULAR | Status: AC
  Filled 2011-09-27: qty 1

## 2011-09-27 MED ORDER — MIDAZOLAM HCL 5 MG/5ML IJ SOLN
INTRAMUSCULAR | Status: AC
  Filled 2011-09-27: qty 10

## 2011-09-27 MED ORDER — MIDAZOLAM HCL 5 MG/5ML IJ SOLN
INTRAMUSCULAR | Status: DC | PRN
  Administered 2011-09-27: 1 mg via INTRAVENOUS
  Administered 2011-09-27 (×2): 2 mg via INTRAVENOUS

## 2011-09-27 MED ORDER — MEPERIDINE HCL 100 MG/ML IJ SOLN
INTRAMUSCULAR | Status: DC | PRN
  Administered 2011-09-27: 50 mg via INTRAVENOUS
  Administered 2011-09-27: 25 mg via INTRAVENOUS

## 2011-09-27 MED ORDER — STERILE WATER FOR IRRIGATION IR SOLN
Status: DC | PRN
  Administered 2011-09-27: 07:00:00

## 2011-09-27 NOTE — Op Note (Signed)
[facility] [address] [place], [place]  [address]  COLONOSCOPY PROCEDURE REPORT  PATIENT:  Stephen Stephenson, Stephen Stephenson  MR#:  [id number] BIRTHDATE:  Jan 21, 1951, 57 yrs. old  GENDER:  male ENDOSCOPIST:  R. Garfield Cornea, MD FACP [facility] REF. BY:  Sharilyn Sites, M.D. PROCEDURE DATE:  09/27/2011 PROCEDURE:  Colonoscopy with snare polypectomy  INDICATIONS:  Average risk colorectal cancer screening.  INFORMED CONSENT:  The risks, benefits, alternatives and imponderables including but not limited to bleeding, perforation as well as the possibility of a missed lesion have been reviewed. The potential for biopsy, lesion removal, etc. have also been discussed.  Questions have been answered.  All parties agreeable. Please see the history and physical in the medical record for more information.  MEDICATIONS:  Versed 5 mg IV and Demerol 75 mg IV in divided doses.  DESCRIPTION OF PROCEDURE:  After a digital rectal exam was performed, the EC-[id number] ([id number]) colonoscope was advanced from the anus through the rectum and colon to the area of the cecum, ileocecal valve and appendiceal orifice.  The cecum was deeply intubated.  These structures were well-seen and photographed for the record.  From the level of the cecum and ileocecal valve, the scope was slowly and cautiously withdrawn.  The mucosal surfaces were carefully surveyed utilizing scope tip deflection to facilitate fold flattening as needed.  The scope was pulled down into the rectum where a thorough examination including retroflexion was performed. <<PROCEDUREIMAGES>>  FINDINGS: Preparation. Anal papilla and minimal antral hemorrhoids; otherwise normal rectum. A single 1 cm pedunculated polyp at the hepatic flexure. The remainder of the colonic mucosa appeared normal.  THERAPEUTIC / DIAGNOSTIC MANEUVERS PERFORMED: The above-mentioned polyp was removed with hot snare cautery. We did have a little technical difficulty getting the  snare loop appropriately snuged around the base of the polyp. The mechanism was stiff and it was difficult to tightly grasp the polyp for unknown reasons. However, the polyp was removed cleanly with one pass.  COMPLICATIONS:  None  CECAL WITHDRAWAL TIME: 15 minutes  IMPRESSION:  Internal hemorrhoids. Hepatic flexure polyp-removed as described above  RECOMMENDATIONS: Follow up on pathology  ______________________________ R. Garfield Cornea, MD [facility]  CC:  Sharilyn Sites, M.D.  n. eSIGNED:   R. Garfield Cornea at 09/27/2011 08:14 AM  Christianne Borrow, [phone]

## 2011-09-27 NOTE — H&P (Signed)
Primary Care Physician:  Colette Ribas, MD, MD Primary Gastroenterologist:  Dr. Jena Gauss  Pre-Procedure History & Physical: HPI:  Stephen Stephenson is a 61 y.o. male is here for a screening colonoscopy. Last exam 10 years ago; reportedly negative. No bowel symptoms. Family history colon polyps or colon cancer.  Past Medical History  Diagnosis Date  . GERD (gastroesophageal reflux disease)   . Hepatitis C     genotype 1 eradicated  . Hb-AS genotype   . DM (diabetes mellitus)     type II  . IBS (irritable bowel syndrome)   . Insomnia   . Kidney stone     Past Surgical History  Procedure Date  . Shoulder surgery   . Back surgery   . Colonoscopy 02/05/02    anal canal herrhoids ow normal/normal ileum/suspect pt bled from hemorrhoids presently  . Esophagogastroduodenoscopy 05/17/05    normal esophagus/gastric mucosa appeared normal  . Kidney stone surgery     Prior to Admission medications   Medication Sig Start Date End Date Taking? Authorizing Provider  aspirin EC 81 MG tablet Take 81 mg by mouth daily.   Yes Historical Provider, MD  sitaGLIPtan-metformin (JANUMET) 50-1000 MG per tablet Take 1 tablet by mouth 2 (two) times daily with a meal.   Yes Historical Provider, MD    Allergies as of 08/27/2011  . (No Known Allergies)    Family History  Problem Relation Age of Onset  . Colon cancer Neg Hx     History   Social History  . Marital Status: Married    Spouse Name: N/A    Number of Children: N/A  . Years of Education: N/A   Occupational History  . Not on file.   Social History Main Topics  . Smoking status: Former Smoker -- 1.0 packs/day for 25 years  . Smokeless tobacco: Not on file  . Alcohol Use: Yes     Occasionally  . Drug Use: No  . Sexually Active:    Other Topics Concern  . Not on file   Social History Narrative  . No narrative on file    Review of Systems: See HPI, otherwise negative ROS  Physical Exam: BP 153/93  Pulse 80  Temp(Src)  97.7 F (36.5 C) (Oral)  Ht 6' (1.829 m)  Wt 210 lb (95.255 kg)  BMI 28.48 kg/m2  SpO2 94% General:   Alert,  Well-developed, well-nourished, pleasant and cooperative in NAD Head:  Normocephalic and atraumatic. Eyes:  Sclera clear, no icterus.   Conjunctiva pink. Ears:  Normal auditory acuity. Nose:  No deformity, discharge,  or lesions. Mouth:  No deformity or lesions, dentition normal. Neck:  Supple; no masses or thyromegaly. Lungs:  Clear throughout to auscultation.   No wheezes, crackles, or rhonchi. No acute distress. Heart:  Regular rate and rhythm; no murmurs, clicks, rubs,  or gallops. Abdomen:  Soft, nontender and nondistended. No masses, hepatosplenomegaly or hernias noted. Normal bowel sounds, without guarding, and without rebound.   Msk:  Symmetrical without gross deformities. Normal posture. Pulses:  Normal pulses noted. Extremities:  Without clubbing or edema. Neurologic:  Alert and  oriented x4;  grossly normal neurologically. Skin:  Intact without significant lesions or rashes. Cervical Nodes:  No significant cervical adenopathy. Psych:  Alert and cooperative. Normal mood and affect.  Impression/Plan: Stephen Stephenson is now here to undergo a screening colonoscopy.  Average risk screening examination.  Risks, benefits, limitations, imponderables and alternatives regarding colonoscopy have been reviewed with the patient. Questions  have been answered. All parties agreeable.

## 2011-09-27 NOTE — Discharge Instructions (Signed)
Colonoscopy Discharge Instructions  Read the instructions outlined below and refer to this sheet in the next few weeks. These discharge instructions provide you with general information on caring for yourself after you leave the hospital. Your doctor may also give you specific instructions. While your treatment has been planned according to the most current medical practices available, unavoidable complications occasionally occur. If you have any problems or questions after discharge, call Dr. Gala Romney at [phone]. ACTIVITY  You may resume your regular activity, but move at a slower pace for the next 24 hours.   Take frequent rest periods for the next 24 hours.   Walking will help get rid of the air and reduce the bloated feeling in your belly (abdomen).   No driving for 24 hours (because of the medicine (anesthesia) used during the test).    Do not sign any important legal documents or operate any machinery for 24 hours (because of the anesthesia used during the test).  NUTRITION  Drink plenty of fluids.   You may resume your normal diet as instructed by your doctor.   Begin with a light meal and progress to your normal diet. Heavy or fried foods are harder to digest and may make you feel sick to your stomach (nauseated).   Avoid alcoholic beverages for 24 hours or as instructed.  MEDICATIONS  You may resume your normal medications unless your doctor tells you otherwise.  WHAT YOU CAN EXPECT TODAY  Some feelings of bloating in the abdomen.   Passage of more gas than usual.   Spotting of blood in your stool or on the toilet paper.  IF YOU HAD POLYPS REMOVED DURING THE COLONOSCOPY:  No aspirin products for 7 days or as instructed.   No alcohol for 7 days or as instructed.   Eat a soft diet for the next 24 hours.  FINDING OUT THE RESULTS OF YOUR TEST Not all test results are available during your visit. If your test results are not back during the visit, make an appointment  with your caregiver to find out the results. Do not assume everything is normal if you have not heard from your caregiver or the medical facility. It is important for you to follow up on all of your test results.  SEEK IMMEDIATE MEDICAL ATTENTION IF:  You have more than a spotting of blood in your stool.   Your belly is swollen (abdominal distention).   You are nauseated or vomiting.   You have a temperature over 101.   You have abdominal pain or discomfort that is severe or gets worse throughout the day.    Polyp information provided.  Further recommendations to follow pending review of pathology report.  Colon Polyps A polyp is extra tissue that grows inside your body. Colon polyps grow in the large intestine. The large intestine, also called the colon, is part of your digestive system. It is a long, hollow tube at the end of your digestive tract where your body makes and stores stool. Most polyps are not dangerous. They are benign. This means they are not cancerous. But over time, some types of polyps can turn into cancer. Polyps that are smaller than a pea are usually not harmful. But larger polyps could someday become or may already be cancerous. To be safe, doctors remove all polyps and test them.  WHO GETS POLYPS? Anyone can get polyps, but certain people are more likely than others. You may have a greater chance of getting polyps if:  You are over 50.   You have had polyps before.   Someone in your family has had polyps.   Someone in your family has had cancer of the large intestine.   Find out if someone in your family has had polyps. You may also be more likely to get polyps if you:   Eat a lot of fatty foods.   Smoke.   Drink alcohol.   Do not exercise.   Eat too much.  SYMPTOMS  Most small polyps do not cause symptoms. People often do not know they have one until their caregiver finds it during a regular checkup or while testing them for something else. Some  people do have symptoms like these:  Bleeding from the anus. You might notice blood on your underwear or on toilet paper after you have had a bowel movement.   Constipation or diarrhea that lasts more than a week.   Blood in the stool. Blood can make stool look black or it can show up as red streaks in the stool.  If you have any of these symptoms, see your caregiver. HOW DOES THE DOCTOR TEST FOR POLYPS? The doctor can use four tests to check for polyps:  Digital rectal exam. The caregiver wears gloves and checks your rectum (the last part of the large intestine) to see if it feels normal. This test would find polyps only in the rectum. Your caregiver may need to do one of the other tests listed below to find polyps higher up in the intestine.   Barium enema. The caregiver puts a liquid called barium into your rectum before taking x-rays of your large intestine. Barium makes your intestine look white in the pictures. Polyps are dark, so they are easy to see.   Sigmoidoscopy. With this test, the caregiver can see inside your large intestine. A thin flexible tube is placed into your rectum. The device is called a sigmoidoscope, which has a light and a tiny video camera in it. The caregiver uses the sigmoidoscope to look at the last third of your large intestine.   Colonoscopy. This test is like sigmoidoscopy, but the caregiver looks at all of the large intestine. It usually requires sedation. This is the most common method for finding and removing polyps.  TREATMENT   The caregiver will remove the polyp during sigmoidoscopy or colonoscopy. The polyp is then tested for cancer.   If you have had polyps, your caregiver may want you to get tested regularly in the future.  PREVENTION  There is not one sure way to prevent polyps. You might be able to lower your risk of getting them if you:  Eat more fruits and vegetables and less fatty food.   Do not smoke.   Avoid alcohol.   Exercise every  day.   Lose weight if you are overweight.   Eating more calcium and folate can also lower your risk of getting polyps. Some foods that are rich in calcium are milk, cheese, and broccoli. Some foods that are rich in folate are chickpeas, kidney beans, and spinach.   Aspirin might help prevent polyps. Studies are under way.  Document Released: 03/27/2004 Document Revised: 06/20/2011 Document Reviewed: 09/02/2007 [facility] Patient Information 2012 [place].

## 2011-10-01 ENCOUNTER — Encounter (HOSPITAL_COMMUNITY): Payer: Self-pay | Admitting: Internal Medicine

## 2011-10-01 ENCOUNTER — Encounter: Payer: Self-pay | Admitting: Internal Medicine

## 2011-10-02 ENCOUNTER — Encounter (HOSPITAL_COMMUNITY): Payer: Self-pay

## 2011-10-02 ENCOUNTER — Emergency Department (HOSPITAL_COMMUNITY)
Admission: EM | Admit: 2011-10-02 | Discharge: 2011-10-02 | Disposition: A | Discharge status: Home / Self Care | Payer: BC Managed Care – PPO | Attending: Emergency Medicine | Admitting: Emergency Medicine

## 2011-10-02 ENCOUNTER — Emergency Department (HOSPITAL_COMMUNITY): Payer: BC Managed Care – PPO

## 2011-10-02 DIAGNOSIS — W11XXXA Fall on and from ladder, initial encounter: Secondary | ICD-10-CM | POA: Insufficient documentation

## 2011-10-02 DIAGNOSIS — F17200 Nicotine dependence, unspecified, uncomplicated: Secondary | ICD-10-CM | POA: Insufficient documentation

## 2011-10-02 DIAGNOSIS — E119 Type 2 diabetes mellitus without complications: Secondary | ICD-10-CM | POA: Insufficient documentation

## 2011-10-02 DIAGNOSIS — S0100XA Unspecified open wound of scalp, initial encounter: Secondary | ICD-10-CM | POA: Insufficient documentation

## 2011-10-02 DIAGNOSIS — S0101XA Laceration without foreign body of scalp, initial encounter: Secondary | ICD-10-CM

## 2011-10-02 DIAGNOSIS — S0990XA Unspecified injury of head, initial encounter: Secondary | ICD-10-CM | POA: Insufficient documentation

## 2011-10-02 DIAGNOSIS — Z7982 Long term (current) use of aspirin: Secondary | ICD-10-CM | POA: Insufficient documentation

## 2011-10-02 DIAGNOSIS — Z87442 Personal history of urinary calculi: Secondary | ICD-10-CM | POA: Insufficient documentation

## 2011-10-02 DIAGNOSIS — K589 Irritable bowel syndrome without diarrhea: Secondary | ICD-10-CM | POA: Insufficient documentation

## 2011-10-02 DIAGNOSIS — Y92009 Unspecified place in unspecified non-institutional (private) residence as the place of occurrence of the external cause: Secondary | ICD-10-CM | POA: Insufficient documentation

## 2011-10-02 MED ORDER — ACETAMINOPHEN 500 MG PO TABS: 1000.0000 mg | ORAL_TABLET | Freq: Once | ORAL | Status: DC

## 2011-10-02 MED ORDER — HYDROCODONE-ACETAMINOPHEN 5-325 MG PO TABS: 1.0000 | ORAL_TABLET | ORAL | Status: AC | PRN

## 2011-10-02 MED ORDER — LIDOCAINE-EPINEPHRINE (PF) 1 %-1:200000 IJ SOLN
INTRAMUSCULAR | Status: AC
  Filled 2011-10-02: qty 10

## 2011-10-02 MED ORDER — LIDOCAINE-EPINEPHRINE 2 %-1:100000 IJ SOLN: 20.0000 mL | Freq: Once | INTRAMUSCULAR | Status: DC

## 2011-10-02 MED ORDER — LIDOCAINE-EPINEPHRINE (PF) 2 %-1:200000 IJ SOLN
INTRAMUSCULAR | Status: AC
  Filled 2011-10-02: qty 20

## 2011-10-02 MED ORDER — ACETAMINOPHEN 500 MG PO TABS
ORAL_TABLET | ORAL | Status: AC
  Administered 2011-10-02: 1000 mg
  Filled 2011-10-02: qty 2

## 2011-10-02 NOTE — ED Notes (Signed)
Pt had ladder fall onto left side of head. Laceration present to left side of head. Bleeding controlled. Denies loc.

## 2011-10-02 NOTE — Discharge Instructions (Signed)
Staple Wound Closure  Your wound has been cleaned and closed with skin staples.  HOME CARE   Keep the area around the staples clean and dry.   Rest and raise (elevate) the injured part above the level of your heart.   See your doctor for a follow-up check of the wound.   See your doctor to have the staples removed.   As the wound heals, you may leave it open to the air and clean it daily with water.   Do not soak the wound in water for long periods of time.   Watch for signs of a wound infection:   Unusual redness or puffiness around the wound.   Increasing pain or tenderness.   Yellowish white fluid (pus) coming from the wound.  You may need a tetanus shot if:   You cannot remember when you had your last tetanus shot.   You have never had a tetanus shot.   The injury broke your skin.  If you need a tetanus shot and you choose not to have one, you may get tetanus. Sickness from tetanus can be serious.  GET HELP RIGHT AWAY IF:    You think the wound is infected.   The wound does not stay together after the staples have been taken out.   Something comes out of the wound, such as wood or glass.   You or your child has problems moving the injured area.   You or your child has a temperature by mouth above 102 F (38.9 C), not controlled by medicine.  MAKE SURE YOU:    Understand these instructions.   Will watch this condition.   Will get help right away if you or your child is not doing well or gets worse.  Document Released: 04/09/2008 Document Revised: 06/20/2011 Document Reviewed: 06/29/2008  [employer] Patient Information 2012 [employer].

## 2011-10-03 NOTE — ED Provider Notes (Signed)
History     CSN: [id number]  Arrival date & time 10/02/11  1902   First MD Initiated Contact with Patient 10/02/11 1913      Chief Complaint  Patient presents with  . Head Laceration    (Consider location/radiation/quality/duration/timing/severity/associated sxs/prior treatment) HPI Comments: Patient sustained a laceration to his left scalp when a ladder that was on his roof blew off glancing blow across his head.  He had just crawled off the roof while doing a roof repair, hence the reason for his b number roof.  He denies loss of consciousness, but states he was "dazed" briefly after the event.  Patient is a 61 y.o. male presenting with scalp laceration. The history is provided by the patient.  Head Laceration This is a new problem. The current episode started today. The problem has been unchanged. Associated symptoms include headaches. Pertinent negatives include no abdominal pain, arthralgias, chest pain, congestion, fever, joint swelling, nausea, neck pain, numbness, rash, sore throat, vertigo, visual change, vomiting or weakness. Exacerbated by: palpation makes worse. Treatments tried: pressure applied to wound temporarily improved bleeding, but it started bleeding again just prior to arrival.    Past Medical History  Diagnosis Date  . GERD (gastroesophageal reflux disease)   . Hepatitis C     genotype 1 eradicated  . Hb-AS genotype   . DM (diabetes mellitus)     type II  . IBS (irritable bowel syndrome)   . Insomnia   . Kidney stone     Past Surgical History  Procedure Date  . Shoulder surgery   . Back surgery   . Colonoscopy 02/05/02    anal canal herrhoids ow normal/normal ileum/suspect pt bled from hemorrhoids presently  . Esophagogastroduodenoscopy 05/17/05    normal esophagus/gastric mucosa appeared normal  . Kidney stone surgery   . Colonoscopy 09/27/2011    Procedure: COLONOSCOPY;  Surgeon: [name], MD;  Location: [facility];  Service: Endoscopy;   Laterality: N/A;  7:30 AM    Family History  Problem Relation Age of Onset  . Colon cancer Neg Hx     History  Substance Use Topics  . Smoking status: Former Smoker -- 1.0 packs/day for 25 years  . Smokeless tobacco: Not on file  . Alcohol Use: Yes     Occasionally      Review of Systems  Constitutional: Negative for fever.  HENT: Negative for congestion, sore throat and neck pain.   Eyes: Negative.   Respiratory: Negative for chest tightness and shortness of breath.   Cardiovascular: Negative for chest pain.  Gastrointestinal: Negative for nausea, vomiting and abdominal pain.  Genitourinary: Negative.   Musculoskeletal: Negative for joint swelling and arthralgias.  Skin: Positive for wound. Negative for rash.  Neurological: Positive for headaches. Negative for dizziness, vertigo, weakness, light-headedness and numbness.  Hematological: Negative.   Psychiatric/Behavioral: Negative.     Allergies  Review of patient's allergies indicates no known allergies.  Home Medications   Current Outpatient Rx  Name Route Sig Dispense Refill  . ASPIRIN EC 81 MG PO TBEC Oral Take 81 mg by mouth daily.    [name] SITAGLIPTIN-METFORMIN HCL 50-1000 MG PO TABS Oral Take 1 tablet by mouth 2 (two) times daily with a meal.    . HYDROCODONE-ACETAMINOPHEN 5-325 MG PO TABS Oral Take 1 tablet by mouth every 4 (four) hours as needed for pain. 10 tablet 0    BP 141/83  Pulse 84  Temp(Src) 97.2 F (36.2 C) (Oral)  Resp 20  Ht 6' (1.829 m)  Wt 210 lb (95.255 kg)  BMI 28.48 kg/m2  SpO2 98%  Physical Exam  Nursing note and vitals reviewed. Constitutional: He is oriented to person, place, and time. He appears well-developed and well-nourished.  HENT:  Head: Normocephalic and atraumatic.  Eyes: Conjunctivae and EOM are normal. Pupils are equal, round, and reactive to light.  Neck: Normal range of motion. Neck supple. No spinous process tenderness and no muscular tenderness present. Normal range  of motion present.  Cardiovascular: Normal rate, regular rhythm, normal heart sounds and intact distal pulses.   Pulmonary/Chest: Effort normal and breath sounds normal. He has no wheezes.  Abdominal: Soft. Bowel sounds are normal. There is no tenderness.  Musculoskeletal: Normal range of motion.  Neurological: He is alert and oriented to person, place, and time. He has normal strength. No cranial nerve deficit or sensory deficit. Coordination and gait normal. GCS eye subscore is 4. GCS verbal subscore is 5. GCS motor subscore is 6.  Skin: Skin is warm and dry.       3 cm linear and hemostatic laceration left parietal scalp.  Well approximated.  Psychiatric: He has a normal mood and affect.    ED Course  Procedures (including critical care time)  Labs Reviewed - No data to display Ct Head Wo Contrast  10/02/2011  *RADIOLOGY REPORT*  Clinical Data: Fall with head trauma to the right  CT HEAD WITHOUT CONTRAST  Technique:  Contiguous axial images were obtained from the base of the skull through the vertex without contrast.  Comparison: None.  Findings: The brain does not show accelerated atrophy.  There is no evidence of old or acute small or large vessel infarction, mass lesion, hemorrhage, hydrocephalus or extra-axial collection.  There is a physiologic calcification in the basal ganglia.  No skull fracture.  No fluid in the sinuses, middle ears or mastoids.  IMPRESSION: Negative head CT  Original Report Authenticated By: Jules Schick, M.D.     1. Laceration of scalp without complication   2. Minor head injury    LACERATION REPAIR Performed by: Fulton Reek Authorized by: Fulton Reek Consent: Verbal consent obtained. Risks and benefits: risks, benefits and alternatives were discussed Consent given by: patient Patient identity confirmed: provided demographic data Prepped and Draped in normal sterile fashion Wound explored  Laceration Location:left scalp  Laceration Length:3  cm  No Foreign Bodies seen or palpated  Anesthesia: local infiltration  Local anesthetic: lidocaine 2% With epinephrine  Anesthetic total: 3 ml  Irrigation method: syringe Amount of cleaning: standard  Skin closure: staples  Number of sutures: 6  Technique:   Patient tolerance: Patient tolerated the procedure well with no immediate complications.    MDM  Patient with normal neuro exam with minor head injury and no symptoms currently suggestive of post concussion syndrome.  Wound repaired easily and without complication.  Patient encouraged to return in 10 days for staple removal, sooner if he develops any signs or symptoms of infection.        Fulton Reek, [place] 10/03/11 [phone]

## 2011-10-06 NOTE — ED Provider Notes (Signed)
Medical screening examination/treatment/procedure(s) were performed by non-physician practitioner and as supervising physician I was immediately available for consultation/collaboration.  Richarda Blade, MD 10/06/11 1051

## 2011-10-07 ENCOUNTER — Telehealth: Payer: Self-pay | Admitting: Internal Medicine

## 2011-10-07 MED ORDER — RABEPRAZOLE SODIUM 20 MG PO TBEC: 20.0000 mg | DELAYED_RELEASE_TABLET | Freq: Every day | ORAL | Status: DC

## 2011-10-07 NOTE — Telephone Encounter (Signed)
Please verify patient needed AcipHex.  Last samples given were Dexilant? Looks like there may have been a problem with insurance.

## 2011-10-07 NOTE — Telephone Encounter (Signed)
Pt called to see if we could call in his Aciphex prescription to [facility] in [place]. He has finished his samples and they are working well for him and would like prescription called in.

## 2011-10-08 NOTE — Telephone Encounter (Signed)
Pt was given a voucher for 14 free Aciphex from [facility] while at the hospital. Pt said they were working ok and will let us know if he has any problems.

## 2012-03-14 ENCOUNTER — Encounter (HOSPITAL_COMMUNITY): Payer: Self-pay | Admitting: *Deleted

## 2012-03-14 ENCOUNTER — Observation Stay (HOSPITAL_COMMUNITY)
Admission: EM | Admit: 2012-03-14 | Discharge: 2012-03-15 | DRG: 323 | Disposition: A | Discharge status: Home / Self Care | Payer: BC Managed Care – PPO | Attending: Internal Medicine | Admitting: Internal Medicine

## 2012-03-14 ENCOUNTER — Emergency Department (HOSPITAL_COMMUNITY): Payer: BC Managed Care – PPO

## 2012-03-14 DIAGNOSIS — K589 Irritable bowel syndrome without diarrhea: Secondary | ICD-10-CM | POA: Diagnosis present

## 2012-03-14 DIAGNOSIS — D573 Sickle-cell trait: Secondary | ICD-10-CM | POA: Diagnosis present

## 2012-03-14 DIAGNOSIS — Z8619 Personal history of other infectious and parasitic diseases: Secondary | ICD-10-CM | POA: Insufficient documentation

## 2012-03-14 DIAGNOSIS — Z7982 Long term (current) use of aspirin: Secondary | ICD-10-CM

## 2012-03-14 DIAGNOSIS — N201 Calculus of ureter: Principal | ICD-10-CM | POA: Diagnosis present

## 2012-03-14 DIAGNOSIS — N200 Calculus of kidney: Secondary | ICD-10-CM | POA: Diagnosis present

## 2012-03-14 DIAGNOSIS — B1920 Unspecified viral hepatitis C without hepatic coma: Secondary | ICD-10-CM | POA: Diagnosis present

## 2012-03-14 DIAGNOSIS — Z79899 Other long term (current) drug therapy: Secondary | ICD-10-CM

## 2012-03-14 DIAGNOSIS — Z87891 Personal history of nicotine dependence: Secondary | ICD-10-CM

## 2012-03-14 DIAGNOSIS — G4700 Insomnia, unspecified: Secondary | ICD-10-CM | POA: Diagnosis present

## 2012-03-14 DIAGNOSIS — I10 Essential (primary) hypertension: Secondary | ICD-10-CM | POA: Diagnosis present

## 2012-03-14 DIAGNOSIS — K219 Gastro-esophageal reflux disease without esophagitis: Secondary | ICD-10-CM

## 2012-03-14 DIAGNOSIS — E119 Type 2 diabetes mellitus without complications: Secondary | ICD-10-CM | POA: Diagnosis present

## 2012-03-14 DIAGNOSIS — Z87442 Personal history of urinary calculi: Secondary | ICD-10-CM

## 2012-03-14 LAB — CBC WITH DIFFERENTIAL/PLATELET
Basophils Absolute: 0 10*3/uL (ref 0.0–0.1)
Eosinophils Relative: 1 % (ref 0–5)
HCT: 46 % (ref 39.0–52.0)
Hemoglobin: 16.7 g/dL (ref 13.0–17.0)
MCH: 31.5 pg (ref 26.0–34.0)
MCHC: 36.3 g/dL — ABNORMAL HIGH (ref 30.0–36.0)
MCV: 86.8 fL (ref 78.0–100.0)
Monocytes Absolute: 0.5 10*3/uL (ref 0.1–1.0)
Monocytes Relative: 6 % (ref 3–12)
Neutro Abs: 7.3 10*3/uL (ref 1.7–7.7)

## 2012-03-14 LAB — URINE MICROSCOPIC-ADD ON

## 2012-03-14 LAB — BASIC METABOLIC PANEL
CO2: 22 mEq/L (ref 19–32)
Calcium: 10.1 mg/dL (ref 8.4–10.5)
GFR calc non Af Amer: 79 mL/min — ABNORMAL LOW (ref >90)
Glucose, Bld: 186 mg/dL — ABNORMAL HIGH (ref 70–99)
Potassium: 3.7 mEq/L (ref 3.5–5.1)
Sodium: 139 mEq/L (ref 135–145)

## 2012-03-14 LAB — URINALYSIS, ROUTINE W REFLEX MICROSCOPIC
Leukocytes, UA: NEGATIVE
Protein, ur: 100 mg/dL — AB
Specific Gravity, Urine: >1.03 — ABNORMAL HIGH (ref 1.005–1.030)
Urobilinogen, UA: 0.2 mg/dL (ref 0.0–1.0)

## 2012-03-14 MED ORDER — PANTOPRAZOLE SODIUM 40 MG PO TBEC: 40.0000 mg | DELAYED_RELEASE_TABLET | Freq: Every day | ORAL | Status: DC

## 2012-03-14 MED ORDER — ONDANSETRON HCL 4 MG/2ML IJ SOLN: 4.0000 mg | Freq: Four times a day (QID) | INTRAMUSCULAR | Status: AC | PRN

## 2012-03-14 MED ORDER — HYDROMORPHONE HCL PF 2 MG/ML IJ SOLN
2.0000 mg | Freq: Once | INTRAMUSCULAR | Status: AC
  Administered 2012-03-14: 2 mg via INTRAVENOUS
  Filled 2012-03-14: qty 1

## 2012-03-14 MED ORDER — ZOLPIDEM TARTRATE 5 MG PO TABS
5.0000 mg | ORAL_TABLET | Freq: Every evening | ORAL | Status: DC | PRN
  Administered 2012-03-15 (×2): 5 mg via ORAL
  Filled 2012-03-14 (×2): qty 1

## 2012-03-14 MED ORDER — INSULIN ASPART 100 UNIT/ML ~~LOC~~ SOLN: 0.0000 [IU] | Freq: Three times a day (TID) | SUBCUTANEOUS | Status: DC

## 2012-03-14 MED ORDER — OXYCODONE HCL 5 MG PO TABS: 5.0000 mg | ORAL_TABLET | ORAL | Status: DC | PRN

## 2012-03-14 MED ORDER — ONDANSETRON HCL 4 MG/2ML IJ SOLN
4.0000 mg | Freq: Once | INTRAMUSCULAR | Status: AC
  Administered 2012-03-14: 4 mg via INTRAVENOUS

## 2012-03-14 MED ORDER — TRIAZOLAM 0.125 MG PO TABS: 0.2500 mg | ORAL_TABLET | Freq: Every evening | ORAL | Status: DC | PRN

## 2012-03-14 MED ORDER — ALUM & MAG HYDROXIDE-SIMETH 200-200-20 MG/5ML PO SUSP: 30.0000 mL | Freq: Four times a day (QID) | ORAL | Status: DC | PRN

## 2012-03-14 MED ORDER — SODIUM CHLORIDE 0.9 % IV SOLN
INTRAVENOUS | Status: DC
  Administered 2012-03-14: 18:00:00 via INTRAVENOUS
  Administered 2012-03-14: 160 mL/h via INTRAVENOUS

## 2012-03-14 MED ORDER — ONDANSETRON HCL 4 MG PO TABS: 4.0000 mg | ORAL_TABLET | Freq: Four times a day (QID) | ORAL | Status: DC | PRN

## 2012-03-14 MED ORDER — ONDANSETRON HCL 4 MG/2ML IJ SOLN: 4.0000 mg | Freq: Four times a day (QID) | INTRAMUSCULAR | Status: DC | PRN

## 2012-03-14 MED ORDER — TAMSULOSIN HCL 0.4 MG PO CAPS
0.8000 mg | ORAL_CAPSULE | Freq: Every day | ORAL | Status: DC
  Administered 2012-03-15: 0.8 mg via ORAL
  Filled 2012-03-14 (×3): qty 2

## 2012-03-14 MED ORDER — HYDROMORPHONE HCL PF 1 MG/ML IJ SOLN: 1.0000 mg | INTRAMUSCULAR | Status: DC | PRN

## 2012-03-14 MED ORDER — HYDROMORPHONE HCL PF 2 MG/ML IJ SOLN
2.0000 mg | INTRAMUSCULAR | Status: DC | PRN
  Administered 2012-03-14: 2 mg via INTRAVENOUS
  Filled 2012-03-14: qty 1

## 2012-03-14 MED ORDER — SODIUM CHLORIDE 0.9 % IV SOLN
INTRAVENOUS | Status: DC
  Administered 2012-03-14: via INTRAVENOUS

## 2012-03-14 MED ORDER — DOCUSATE SODIUM 100 MG PO CAPS
100.0000 mg | ORAL_CAPSULE | Freq: Two times a day (BID) | ORAL | Status: DC
  Administered 2012-03-15 (×2): 100 mg via ORAL
  Filled 2012-03-14 (×6): qty 1

## 2012-03-14 NOTE — ED Notes (Signed)
Notified when patient moved he struck his hand and IV accidentally fell out.  Advised will restart asap.

## 2012-03-14 NOTE — ED Provider Notes (Signed)
History     CSN: [id number]  Arrival date & time 03/14/12  1713   First MD Initiated Contact with Patient 03/14/12 1728      Chief Complaint  Patient presents with  . Abdominal Pain    (Consider location/radiation/quality/duration/timing/severity/associated sxs/prior treatment) HPI Comments: The patient is a 61 year old man with a history of kidney stones. He says about a month ago he had a brief episode of pain lasting about an hour that went away. Today his pain started around 8 AM. He says the pain is always in the lower abdomen, and is quite severe. He has had some vomiting today. Where the pain did not remit, he sought evaluation.  Patient is a 61 y.o. male presenting with abdominal pain. The history is provided by the patient and medical records. No language interpreter was used.  Abdominal Pain The primary symptoms of the illness include abdominal pain. The primary symptoms of the illness do not include fever, nausea, vomiting or diarrhea. The current episode started 6 to 12 hours ago. The onset of the illness was sudden. The problem has not changed since onset. The abdominal pain began 6 to 12 hours ago. The abdominal pain has been unchanged since its onset. The abdominal pain is located in the suprapubic region. The abdominal pain does not radiate. The severity of the abdominal pain is 10/10. The abdominal pain is relieved by nothing. Exacerbated by: Nothing.  The patient has not had a change in bowel habit. Symptoms associated with the illness do not include chills, urgency, hematuria or frequency. Associated medical issues comments: Prior history of kidney stones..    Past Medical History  Diagnosis Date  . GERD (gastroesophageal reflux disease)   . Hepatitis C     genotype 1 eradicated  . Hb-AS genotype   . DM (diabetes mellitus)     type II  . IBS (irritable bowel syndrome)   . Insomnia   . Kidney stone     Past Surgical History  Procedure Date  . Shoulder surgery    . Back surgery   . Colonoscopy 02/05/02    anal canal herrhoids ow normal/normal ileum/suspect pt bled from hemorrhoids presently  . Esophagogastroduodenoscopy 05/17/05    normal esophagus/gastric mucosa appeared normal  . Kidney stone surgery   . Colonoscopy 09/27/2011    Procedure: COLONOSCOPY;  Surgeon: Corbin Ade, MD;  Location: [facility];  Service: Endoscopy;  Laterality: N/A;  7:30 AM    Family History  Problem Relation Age of Onset  . Colon cancer Neg Hx     History  Substance Use Topics  . Smoking status: Former Smoker -- 1.0 packs/day for 25 years  . Smokeless tobacco: Not on file  . Alcohol Use: Yes     Occasionally      Review of Systems  Constitutional: Negative.  Negative for fever and chills.  HENT: Negative.   Eyes: Negative.   Respiratory: Negative.   Cardiovascular: Negative.   Gastrointestinal: Positive for abdominal pain. Negative for nausea, vomiting and diarrhea.  Genitourinary: Negative for urgency, frequency and hematuria.  Skin: Negative.   Neurological: Negative.   Psychiatric/Behavioral: Negative.     Allergies  Review of patient's allergies indicates no known allergies.  Home Medications   Current Outpatient Rx  Name Route Sig Dispense Refill  . ASPIRIN EC 81 MG PO TBEC Oral Take 81 mg by mouth daily.    [name] RABEPRAZOLE SODIUM 20 MG PO TBEC Oral Take 1 tablet (20 mg total) by  mouth daily. 31 tablet 11  . SITAGLIPTIN-METFORMIN HCL 50-1000 MG PO TABS Oral Take 1 tablet by mouth 2 (two) times daily with a meal.      BP 146/89  Pulse 98  Temp 98.6 F (37 C) (Oral)  Resp 20  Ht 6' (1.829 m)  Wt 205 lb (92.987 kg)  BMI 27.80 kg/m2  SpO2 100%  Physical Exam  Nursing note and vitals reviewed. Constitutional: He is oriented to person, place, and time.       Patient is in acute distress with suprapubic pain.  HENT:  Head: Normocephalic and atraumatic.  Right Ear: External ear normal.  Left Ear: External ear normal.    Mouth/Throat: Oropharynx is clear and moist.  Eyes: Conjunctivae and EOM are normal. Pupils are equal, round, and reactive to light. No scleral icterus.  Neck: Normal range of motion. Neck supple.  Cardiovascular: Normal rate, regular rhythm and normal heart sounds.   Pulmonary/Chest: Effort normal and breath sounds normal.  Abdominal: Soft. Bowel sounds are normal.       He localizes pain to the suprapubic region. There is no mass or tenderness.  Musculoskeletal: Normal range of motion. He exhibits no edema and no tenderness.  Neurological: He is alert and oriented to person, place, and time.       No Sensory or motor deficit.  Skin: Skin is warm and dry.  Psychiatric: He has a normal mood and affect. His behavior is normal.    ED Course  Procedures (including critical care time)   Labs Reviewed  CBC WITH DIFFERENTIAL  BASIC METABOLIC PANEL  URINALYSIS, ROUTINE W REFLEX MICROSCOPIC   5:42 PM Pt was seen and had physical examination.  Laboratory tests and CT of the abdomen/pelvis without contrast were ordered.  IV Dilaudid and Zofran were ordered.  7:29 PM CT of abdomen shows 4 mm stone in distal left ureter with hydronephrosis.  Case discussed with Dr. Jerre Simon, as pt has intractable pain.  He requests hospitalists to admit pt and he will consult.   1. Left ureteral calculus            Carleene Cooper III, MD 03/14/12 1950

## 2012-03-14 NOTE — ED Notes (Signed)
No Flomax in pyxis - will address asap

## 2012-03-14 NOTE — ED Notes (Signed)
Stone sent to laboratory in specimen cup with label and analysis ordered per Dr Bea Laura. Phillips Odor

## 2012-03-14 NOTE — ED Notes (Signed)
Nurse tech preparing patient for transport to admission room.  Patient stated he needed to void again - urine strained and stone found.    MD notified.  Admit for overnight and send stone to pathology.

## 2012-03-14 NOTE — ED Notes (Signed)
Pt states kidney stone pain to lower abdomen. Same pain as in the past. Pain began at 0800. Nausea and vomiting.

## 2012-03-14 NOTE — H&P (Signed)
[facility] History and Physical  BIRT REINOSO NFA:[id number] DOB: 08-Feb-1951 DOA: 03/14/2012  Referring physician: Jordan Likes PCP: Colette Ribas, MD   Chief Complaint: Abdominal Pain  HPI: Stephen Stephenson is a 61 y.o. male with recurrent nephrolithiasis who presented to the ED with acute onset intractable anterior left flank pain and anterior suprapubic pain associated with hematuria, dysuria, and nausea/vomiting. He denies and fevers, chills or other illness. Pain in ED 10/10, several episodes of vomiting. Non-contrasted CT shows hydroureters and a 4mm ureteral stone. EDP contacted Dr. Frann Rider, urology who requested hospitalist service admit and he will evaluate tomorrow.  Review of Systems: The patient denies anorexia, fever, weight loss,, vision loss, decreased hearing, hoarseness, chest pain, syncope, dyspnea on exertion, peripheral edema, balance deficits, hemoptysis,  melena, hematochezia, severe indigestion/heartburn, incontinence, genital sores, muscle weakness, suspicious skin lesions, transient blindness, difficulty walking, depression, unusual weight change, abnormal bleeding, enlarged lymph nodes, or angioedema.   Past Medical History  Diagnosis Date  . GERD (gastroesophageal reflux disease)   . Hepatitis C     genotype 1 eradicated  . Hb-AS genotype   . DM (diabetes mellitus)     type II  . IBS (irritable bowel syndrome)   . Insomnia   . Kidney stone    Past Surgical History  Procedure Date  . Shoulder surgery   . Back surgery   . Colonoscopy 02/05/02    anal canal herrhoids ow normal/normal ileum/suspect pt bled from hemorrhoids presently  . Esophagogastroduodenoscopy 05/17/05    normal esophagus/gastric mucosa appeared normal  . Kidney stone surgery   . Colonoscopy 09/27/2011    Procedure: COLONOSCOPY;  Surgeon: Corbin Ade, MD;  Location: [facility];  Service: Endoscopy;  Laterality: N/A;  7:30 AM   Social History:  reports that he has quit  smoking. He does not have any smokeless tobacco history on file. He reports that he drinks alcohol. He reports that he does not use illicit drugs. Lives at home with wife and 3 yo grandchild, independent.  No Known Allergies  Family History  Problem Relation Age of Onset  . Colon cancer Neg Hx     Prior to Admission medications   Medication Sig Start Date End Date Taking? Authorizing Provider  aspirin EC 81 MG tablet Take 81 mg by mouth daily.   Yes Historical Provider, MD  RABEprazole (ACIPHEX) 20 MG tablet Take 1 tablet (20 mg total) by mouth daily. 10/07/11  Yes Joselyn Arrow, NP  sitaGLIPtan-metformin (JANUMET) 50-1000 MG per tablet Take 1 tablet by mouth 2 (two) times daily with a meal.   Yes Historical Provider, MD  triazolam (HALCION) 0.25 MG tablet Take 0.25 mg by mouth at bedtime as needed. sleep   Yes Historical Provider, MD   Physical Exam: Filed Vitals:   03/14/12 1720  BP: 146/89  Pulse: 98  Temp: 98.6 F (37 C)  TempSrc: Oral  Resp: 20  Height: 6' (1.829 m)  Weight: 92.987 kg (205 lb)  SpO2: 100%     General:  Mild distress, cooperative  Eyes: PERRL  ENT: Normal  Neck: supple, no adenopathy  Cardiovascular: RRR, no mrg  Respiratory: CTAB  Abdomen: soft, mild suprapubic tenderness/guading  Skin: no rashes or edema  Musculoskeletal: normal  Psychiatric: appropriate affect and mood  Neurologic: non-focal  Labs on Admission:  Basic Metabolic Panel:  Lab 03/14/12 4696  NA 139  K 3.7  CL 105  CO2 22  GLUCOSE 186*  BUN 18  CREATININE 1.00  CALCIUM 10.1  MG --  PHOS --   CBC:  Lab 03/14/12 1744  WBC 9.7  NEUTROABS 7.3  HGB 16.7  HCT 46.0  MCV 86.8  PLT 136*   Radiological Exams on Admission: Ct Abdomen Pelvis Wo Contrast  03/14/2012  *RADIOLOGY REPORT*  Clinical Data: Severe suprapubic pain, history kidney stones, hepatitis C, GERD, diabetes, irritable bowel syndrome  CT ABDOMEN AND PELVIS WITHOUT CONTRAST  Technique:   Multidetector CT imaging of the abdomen and pelvis was performed following the standard protocol without intravenous contrast. Sagittal and coronal MPR images reconstructed from axial data set.  Comparison: 08/23/2008  Findings: Few scattered blebs at lung bases. 2 mm right lung base nodule image 2 unchanged. Low attenuation foci throughout liver, largest 1.9 x 1.4 cm image 21 unchanged. Left hydronephrosis and ureteral dilatation secondary to a 4 mm diameter distal left ureteral calculus image 81. Bilateral nonobstructing renal calculi largest 4 mm diameter lower pole left kidney. Circumaortic left renal vein. Low attenuation focus in the anterior spleen 1.5 x 1.4 cm image 24 unchanged, present since at least 01/19/2005. Pancreas and adrenal glands normal appearance. Normal appendix.  Sigmoid diverticulosis without evidence of diverticulitis. Descending/sigmoid colon incompletely distended, unable to assess wall thickness. Bowel loops otherwise normal appearance. No mass, adenopathy, free fluid, or inflammatory process. Asymmetric enlargement of left seminal vesicle versus right, stable. Dilated proximal left inguinal canal containing fat unchanged. Minimal prostatic enlargement. Bladder unremarkable. No acute osseous findings.  IMPRESSION: Left hydronephrosis and hydroureter secondary to a 4 mm diameter distal left ureteral calculus. Additional tiny bilateral nonobstructing renal calculi. Scattered low attenuation foci throughout the liver and spleen, stable. Sigmoid diverticulosis. Mild prostatic enlargement.   Original Report Authenticated By: Lollie Marrow, M.D.     EKG: pending  Assessment/Plan Principal Problem:  *Kidney stone Active Problems:  GERD (gastroesophageal reflux disease)  HTN (hypertension)  DM (diabetes mellitus)   1. Acute Nephrolithiasis  IV fluids  Oxycodone and Dilaudid prn  Tamsulosin, attempt medication induced expulsion  Strain all urine  Dr. Frann Rider, urology aware of  patient admission  Zofran for nausea  2. DM  Held Janumet  SSI only and carb mod diet  HbA1C  3. HTN  Mild probably related to pain, no meds at this time  4. GERD  PO protonix  Code Status: Full Family Communication: Discussed plan of care with patient Disposition Plan: Home when medically stable  Time spent: 60 min  [facility] Pager [phone]  If 7PM-7AM, please contact night-coverage [email or web address] Password [email or web address] 03/14/2012, 9:07 PM

## 2012-03-15 DIAGNOSIS — E119 Type 2 diabetes mellitus without complications: Secondary | ICD-10-CM

## 2012-03-15 DIAGNOSIS — K219 Gastro-esophageal reflux disease without esophagitis: Secondary | ICD-10-CM

## 2012-03-15 DIAGNOSIS — I10 Essential (primary) hypertension: Secondary | ICD-10-CM

## 2012-03-15 LAB — GLUCOSE, CAPILLARY: Glucose-Capillary: 106 mg/dL — ABNORMAL HIGH (ref 70–99)

## 2012-03-15 MED ORDER — GI COCKTAIL ~~LOC~~
ORAL | Status: AC
  Filled 2012-03-15: qty 30

## 2012-03-15 MED ORDER — SODIUM CHLORIDE 0.9 % IJ SOLN
INTRAMUSCULAR | Status: AC
  Filled 2012-03-15: qty 3

## 2012-03-15 MED ORDER — GI COCKTAIL ~~LOC~~
30.0000 mL | Freq: Once | ORAL | Status: AC
  Administered 2012-03-15: 30 mL via ORAL
  Filled 2012-03-15: qty 30

## 2012-03-15 MED ORDER — TAMSULOSIN HCL 0.4 MG PO CAPS: 0.8000 mg | ORAL_CAPSULE | Freq: Every day | ORAL | Status: DC

## 2012-03-15 NOTE — Discharge Summary (Signed)
Physician Discharge Summary  [name] MQK:[id number] DOB: [birth year] DOA: 03/14/2012  PCP: Purvis Kilts, MD  Admit date: 03/14/2012 Discharge date: 03/15/2012  Recommendations for Outpatient Follow-up:  Follow up with urology as recommended.  Discharge Diagnoses:  Principal Problem:  *Kidney stone Active Problems:  GERD (gastroesophageal reflux disease)  HTN (hypertension)  DM (diabetes mellitus)   Discharge Condition: stable  Diet recommendation: low sodium/carb modified diet  Filed Weights   03/14/12 1720 03/14/12 2342  Weight: 92.987 kg (205 lb) 92.443 kg (203 lb 12.8 oz)    History of present illness:  Stephen Stephenson is a 61 y.o. male with recurrent nephrolithiasis who presented to the ED with acute onset intractable anterior left flank pain and anterior suprapubic pain associated with hematuria, dysuria, and nausea/vomiting. He denies and fevers, chills or other illness. Pain in ED 10/10, several episodes of vomiting. Non-contrasted CT shows hydroureters and a 42m ureteral stone. EDP contacted Dr. JFreda Munro urology who requested hospitalist service admit and he will evaluate tomorrow.   Hospital Course:  1. Acute Nephrolithiasis Passes the stone last night . And he is currently asymptomatic, stone sent for analysis. Urology consult obtained, recommended pt discharge home and follow up as outpatient.  2. DM  Resume home medications  3. HTN  Controlled.  4. GERD  Controlled.    Procedures:  CT abd and pelvis  Consultations:  urology  Discharge Exam: Filed Vitals:   03/15/12 0526  BP: 120/73  Pulse: 83  Temp: 97.8 F (36.6 C)  Resp: 20   Filed Vitals:   03/14/12 2230 03/14/12 2242 03/14/12 2342 03/15/12 0526  BP: 133/57  158/89 120/73  Pulse: 78 78 94 83  Temp:   97.6 F (36.4 C) 97.8 F (36.6 C)  TempSrc:   Oral   Resp:   20 20  Height:   6' (1.829 m)   Weight:   92.443 kg (203 lb 12.8 oz)   SpO2: 87% 95% 91% 96%    General: alert  afebrile comfortable Cardiovascular: s1s2 Respiratory: CTAB Abdomen; soft NT ND BS+, no CVA tenderness.  Extremities: no pedal edema  Discharge Instructions   Medication List  As of 03/15/2012 10:54 AM   ASK your doctor about these medications         aspirin EC 81 MG tablet   Take 81 mg by mouth daily.      RABEprazole 20 MG tablet   Commonly known as: ACIPHEX   Take 1 tablet (20 mg total) by mouth daily.      sitaGLIPtan-metformin 50-1000 MG per tablet   Commonly known as: JANUMET   Take 1 tablet by mouth 2 (two) times daily with a meal.      triazolam 0.25 MG tablet   Commonly known as: HALCION   Take 0.25 mg by mouth at bedtime as needed. sleep              The results of significant diagnostics from this hospitalization (including imaging, microbiology, ancillary and laboratory) are listed below for reference.    Significant Diagnostic Studies: Ct Abdomen Pelvis Wo Contrast  03/14/2012  *RADIOLOGY REPORT*  Clinical Data: Severe suprapubic pain, history kidney stones, hepatitis C, GERD, diabetes, irritable bowel syndrome  CT ABDOMEN AND PELVIS WITHOUT CONTRAST  Technique:  Multidetector CT imaging of the abdomen and pelvis was performed following the standard protocol without intravenous contrast. Sagittal and coronal MPR images reconstructed from axial data set.  Comparison: 08/23/2008  Findings: Few scattered blebs at lung bases.  2 mm right lung base nodule image 2 unchanged. Low attenuation foci throughout liver, largest 1.9 x 1.4 cm image 21 unchanged. Left hydronephrosis and ureteral dilatation secondary to a 4 mm diameter distal left ureteral calculus image 81. Bilateral nonobstructing renal calculi largest 4 mm diameter lower pole left kidney. Circumaortic left renal vein. Low attenuation focus in the anterior spleen 1.5 x 1.4 cm image 24 unchanged, present since at least 01/19/2005. Pancreas and adrenal glands normal appearance. Normal appendix.  Sigmoid diverticulosis  without evidence of diverticulitis. Descending/sigmoid colon incompletely distended, unable to assess wall thickness. Bowel loops otherwise normal appearance. No mass, adenopathy, free fluid, or inflammatory process. Asymmetric enlargement of left seminal vesicle versus right, stable. Dilated proximal left inguinal canal containing fat unchanged. Minimal prostatic enlargement. Bladder unremarkable. No acute osseous findings.  IMPRESSION: Left hydronephrosis and hydroureter secondary to a 4 mm diameter distal left ureteral calculus. Additional tiny bilateral nonobstructing renal calculi. Scattered low attenuation foci throughout the liver and spleen, stable. Sigmoid diverticulosis. Mild prostatic enlargement.   Original Report Authenticated By: Burnetta Sabin, M.D.     Microbiology: No results found for this or any previous visit (from the past 240 hour(s)).   Labs: Basic Metabolic Panel:  Lab 70/17/79 1744  NA 139  K 3.7  CL 105  CO2 22  GLUCOSE 186*  BUN 18  CREATININE 1.00  CALCIUM 10.1  MG --  PHOS --   Liver Function Tests: No results found for this basename: AST:5,ALT:5,ALKPHOS:5,BILITOT:5,PROT:5,ALBUMIN:5 in the last 168 hours No results found for this basename: LIPASE:5,AMYLASE:5 in the last 168 hours No results found for this basename: AMMONIA:5 in the last 168 hours CBC:  Lab 03/14/12 1744  WBC 9.7  NEUTROABS 7.3  HGB 16.7  HCT 46.0  MCV 86.8  PLT 136*   Cardiac Enzymes: No results found for this basename: CKTOTAL:5,CKMB:5,CKMBINDEX:5,TROPONINI:5 in the last 168 hours BNP: BNP (last 3 results) No results found for this basename: PROBNP:3 in the last 8760 hours CBG:  Lab 03/15/12 0738  GLUCAP 106*    Time coordinating discharge: 30 minutes  Signed:  Toshiye Kever  [facility] 03/15/2012, 10:54 AM

## 2012-03-15 NOTE — Progress Notes (Signed)
Patient complaining of hiccoughs for the past couple hours. Doctor was notified and new orders were given.

## 2012-03-15 NOTE — Consult Note (Signed)
Report#[id number]

## 2012-03-15 NOTE — Progress Notes (Signed)
Initial skin assessment done by Linward Natal and Lawson Fiscal

## 2012-03-15 NOTE — Progress Notes (Signed)
Patient states understanding of discharge instructions.  

## 2012-03-16 NOTE — Consult Note (Signed)
Stephen Stephenson, BEADNELL NO.:  [id number]  MEDICAL RECORD NO.:  [id number]  LOCATION:  A304                          FACILITY:  [facility]  PHYSICIAN:  Ky Barban, M.D.DATE OF BIRTH:  09-Jul-1951  DATE OF CONSULTATION:  03/14/2012 DATE OF DISCHARGE:  03/15/2012                                CONSULTATION   CHIEF COMPLAINT:  Left renal colic.  HISTORY OF PRESENT ILLNESS:  This 61 year old gentleman came to the emergency room with severe pain in the left flank associated with nausea.  No vomiting, fever, chills, or voiding difficulty.  No gross hematuria.  CT scan was done, which showed that he has a 4.2 mm stone in the left ureterovesical junction, and he has no pain since he passed that stone yesterday in the emergency room.  The pain was 10/10.  He was having severe episode of nausea.  No vomiting.  He was admitted to the hospital for further management and control of this pain.  He has no pain during the night and he is completely asymptomatic.  He has a history of ureteral calculus.  In 2010, he underwent stone basket by Dr. Rito Ehrlich.  He has no other surgical problem.  Does have non-insulin dependent diabetes.  He also has GERD, hepatitis C, and irritable bowel syndrome.  SURGICAL HISTORY:  Surgery on his right shoulder and back surgery. History of colonoscopy.  FAMILY HISTORY:  No history of kidney stones.  PERSONAL HISTORY:  He used to smoke.  He quit smoking.  He does drink alcohol in moderation.  No history of illicit drugs.  Lives at home with wife and 39-year-old grandchild.  PHYSICAL EXAMINATION:  GENERAL:  Fully conscious, alert, oriented, and not in acute distress. VITAL SIGNS:  Blood pressure 146/89, temperature 98.6. ABDOMEN:  Soft, flat.  Liver, spleen, kidneys not palpable.  No CVA tenderness. GU: External genitalia is unremarkable. RECTAL:  Deferred.  CT scan as I mentioned, there was a 4.2 mm stone with left hydroureteronephrosis, but  he passed a stone during the night and he is completely asymptomatic, and what I will suggest is that he can be discharged home, and if any problem, he can come to the office.  FINAL DISCHARGE DIAGNOSIS:  Left ureteral calculus.  DISCHARGE CONDITION:  Improved.  DISCHARGE MEDICATIONS:  None.     Ky Barban, M.D.     MIJ/MEDQ  D:  03/15/2012  T:  03/16/2012  Job:  [id number]

## 2012-03-19 LAB — STONE ANALYSIS

## 2012-08-14 ENCOUNTER — Other Ambulatory Visit: Payer: Self-pay

## 2012-08-14 MED ORDER — RABEPRAZOLE SODIUM 20 MG PO TBEC: 20.0000 mg | DELAYED_RELEASE_TABLET | Freq: Every day | ORAL | Status: DC

## 2012-09-01 ENCOUNTER — Ambulatory Visit: Payer: BC Managed Care – PPO | Admitting: Urgent Care

## 2012-09-16 ENCOUNTER — Telehealth: Payer: Self-pay | Admitting: *Deleted

## 2012-09-16 ENCOUNTER — Other Ambulatory Visit: Payer: Self-pay

## 2012-09-16 DIAGNOSIS — R197 Diarrhea, unspecified: Secondary | ICD-10-CM

## 2012-09-16 MED ORDER — DICYCLOMINE HCL 10 MG PO CAPS: 10.0000 mg | ORAL_CAPSULE | Freq: Four times a day (QID) | ORAL | Status: DC

## 2012-09-16 NOTE — Telephone Encounter (Signed)
Per RMR- pt needs to complete a cdiff and can try bentyl 1po qac and hs x5days, #20 no refills. Stay on clear liquids and go to the ED if he gets worse. I called and informed pt. rx sent to [facility] and stool container with lab order at the front desk. Pt stated he would pick it up today.

## 2012-09-16 NOTE — Addendum Note (Signed)
Addended by: Claudina Lick on: 09/16/2012 02:07 PM   Modules accepted: Orders

## 2012-09-16 NOTE — Telephone Encounter (Signed)
Spoke with pt- last Thursday he went to the doctor with a sore throat, slight fever and the "stomach bug", everybody in his house has had it. He was given benzonatate (tessalon), an abx shot and a 5 day course of abx ( pt doesn't remember the name of the abx), pt had vomiting and diarrhea till Saturday. Ever since Saturday he has just had abd pain and diarrhea and the stool looks dark/ black and watery and a lot of weakness. Hasnt been able to eat. He can drink but it hurts when he does. He has tried immodium but it didn't help. Last tcs was 3/13. Pt was seen in the office Oct, 2012.   He wants to know if there is anything we can do. Please advise.

## 2012-09-16 NOTE — Addendum Note (Signed)
Addended by: Claudina Lick on: 09/16/2012 02:03 PM   Modules accepted: Orders

## 2012-09-16 NOTE — Telephone Encounter (Signed)
Stephen Stephenson called this am. He has been sick for the past 9 days with nausea, vomitting, and diarrhea.  I went ahead and made an appt for him for the end of the month, but he needs someone to speak with him sooner. Please call him back. Thank you.

## 2012-09-17 NOTE — Telephone Encounter (Signed)
Pt called back today and stated he has been taking the Bentyl and this is the best he has felt in 2 weeks. He hasnt had an episode of diarrhea since yesterday. His stool is formed now. Informed him that the lab will not perform a cdiff on formed stool. He will keep the container and if he has another episode of diarrhea, he will send it into the lab. Advised pt to keep his appointment at the end of the month with AS.

## 2012-09-28 ENCOUNTER — Telehealth: Payer: Self-pay

## 2012-09-28 NOTE — Telephone Encounter (Signed)
Forwarding to AS to review prior to pts ov.

## 2012-09-28 NOTE — Telephone Encounter (Signed)
Message copied by Myra Rude on Mon Sep 28, 2012  2:08 PM ------      Message from: Corbin Ade      Created: Mon Sep 28, 2012  1:52 PM       Thanks. Leave as is      ----- Message -----         From: Evalee Mutton, LPN         Sent: 09/28/2012  12:07 PM           To: Corbin Ade, MD            Pt is already scheduled to see AS on 10/08/12. Do you want [place] to cancel that appt and reschedule with you?       ----- Message -----         From: Corbin Ade, MD         Sent: 09/25/2012   1:05 PM           To: Evalee Mutton, LPN            I have received labs from [facility] regarding this patient. I noted an AST and ALT of 44 and 58, respectively. He also has thrombocytopenia.            History of hep C eradicated with therapy through this office several years ago.            He needs a abdominal ultrasound to check liver and spleen along with a hepatic profile and office visit with me sometime in about 3-4 weeks to investigate further             ------

## 2012-10-06 ENCOUNTER — Encounter: Payer: Self-pay | Admitting: Internal Medicine

## 2012-10-08 ENCOUNTER — Encounter: Payer: Self-pay | Admitting: Gastroenterology

## 2012-10-08 ENCOUNTER — Telehealth: Payer: Self-pay

## 2012-10-08 ENCOUNTER — Ambulatory Visit (INDEPENDENT_AMBULATORY_CARE_PROVIDER_SITE_OTHER): Payer: BC Managed Care – PPO | Admitting: Gastroenterology

## 2012-10-08 VITALS — BP 145/88 | HR 84 | Temp 97.4°F | Ht 72.0 in | Wt 209.6 lb

## 2012-10-08 DIAGNOSIS — K219 Gastro-esophageal reflux disease without esophagitis: Secondary | ICD-10-CM

## 2012-10-08 DIAGNOSIS — K589 Irritable bowel syndrome without diarrhea: Secondary | ICD-10-CM

## 2012-10-08 DIAGNOSIS — R7989 Other specified abnormal findings of blood chemistry: Secondary | ICD-10-CM

## 2012-10-08 MED ORDER — DICYCLOMINE HCL 10 MG PO CAPS: 10.0000 mg | ORAL_CAPSULE | Freq: Four times a day (QID) | ORAL | Status: DC

## 2012-10-08 NOTE — Assessment & Plan Note (Signed)
62 year old male with recent bout of GI illness, with his wife and granddaughter experiencing similar symptoms. Diarrhea significantly improved with Bentyl, no further N/V, GERD controlled with Aciphex. Some question of "black stool", but this was in the presence of Imodium at some point. Highly doubt melena. Seems to be improving, and with his hx of IBS, I question post-infectious IBS as the culprit here. Will refill Bentyl, add probiotic, and return in 6 weeks to see Dr. Jena Gauss. As of note, last colonoscopy March 2013 with tubular adenoma. He is due for surveillance in March 2016.

## 2012-10-08 NOTE — Telephone Encounter (Signed)
Pt is aware. Lab order has been faxed to lab at [facility]. Leighann, please schedule U/S Pt is already scheduled to see RMR in May.

## 2012-10-08 NOTE — Patient Instructions (Addendum)
Continue to take Bentyl before meals and at bedtime. I have sent refills to your pharmacy.  Start taking a probiotic daily. We have provided samples of Restora. Other options are Digestive Advantage, [employer], [employer] brand, Align.   We would like to see you back in a few weeks to talk to Dr. Jena Gauss. If you note any changes in your symptoms or worsening, call us.

## 2012-10-08 NOTE — Telephone Encounter (Signed)
Patient is scheduled for Abd U/S on April 1st at 9:00 and he is aware

## 2012-10-08 NOTE — Progress Notes (Signed)
Referring Provider: Corrie Mckusick, MD Primary Care Physician:  Colette Ribas, MD Primary Gastroenterologist: Dr. Jena Gauss   Chief Complaint  Patient presents with  . Nausea  . Emesis    HPI:   Stephen Stephenson is a pleasant 62 year old male who was last seen in our office Oct 2012; he also had an updated colonoscopy March 2013 with tubular adenoma. He is due for surveillance in March 2016. History of GERD, with last EGD in 2006 normal. Has tried Aciphex in the past, last visit prescribed Dexilant. Currently taking Aciphex.  He called in not too long ago, thinks it was a bug. Notes prior acute onset of abdominal pain, wife had similar symptoms. 71-year-old daughter had symptoms. States he had vomiting/diarrhea for at least 12 hours. Stomach was bubbling, diarrhea X 4 days. We sent him Bentyl to take, which helped significantly. Now, when he eats, has abdominal pain, symptoms for several weeks. Noted right at umbilicus. Also occasional lower abdominal discomfort that causes diarrhea. No further N/V.  +black stool, only took Imodium once and still had seen it. Noted real soft stool following lower abdominal discomfort, happened after eating pizza. Not still taking Bentyl. History of IBS with more loose stools.   GERD controlled with Aciphex.   Past Medical History  Diagnosis Date  . GERD (gastroesophageal reflux disease)   . Hepatitis C     genotype 1 eradicated  . Hb-AS genotype   . DM (diabetes mellitus)     type II  . IBS (irritable bowel syndrome)   . Insomnia   . Kidney stone     Past Surgical History  Procedure Laterality Date  . Shoulder surgery    . Back surgery    . Colonoscopy  02/05/02    anal canal herrhoids ow normal/normal ileum/suspect pt bled from hemorrhoids presently  . Esophagogastroduodenoscopy  05/17/05    normal esophagus/gastric mucosa appeared normal  . Kidney stone surgery    . Colonoscopy  09/27/2011    [id number] hemorrhoids. Hepatic flexure polyp-removed  TUBULAR ADENOMA, due for surveillance March 2016    Current Outpatient Prescriptions  Medication Sig Dispense Refill  . aspirin EC 81 MG tablet Take 81 mg by mouth daily.      . RABEprazole (ACIPHEX) 20 MG tablet Take 1 tablet (20 mg total) by mouth daily.  31 tablet  11  . sitaGLIPtan-metformin (JANUMET) 50-1000 MG per tablet Take 1 tablet by mouth 2 (two) times daily with a meal.      . dicyclomine (BENTYL) 10 MG capsule Take 1 capsule (10 mg total) by mouth 4 (four) times daily.  20 capsule  0  . Tamsulosin HCl (FLOMAX) 0.4 MG CAPS Take 2 capsules (0.8 mg total) by mouth at bedtime.  30 capsule  0  . triazolam (HALCION) 0.25 MG tablet Take 0.25 mg by mouth at bedtime as needed. sleep      . [DISCONTINUED] metFORMIN (GLUCOPHAGE) 500 MG tablet Take 500 mg by mouth 2 (two) times daily with a meal.      . [DISCONTINUED] omeprazole (PRILOSEC) 20 MG capsule Take 1 capsule (20 mg total) by mouth daily. 30 minutes before breakfast.  30 capsule  1   No current facility-administered medications for this visit.    Allergies as of 10/08/2012  . (No Known Allergies)    Family History  Problem Relation Age of Onset  . Colon cancer Neg Hx     History   Social History  . Marital Status: Married  Spouse Name: N/A    Number of Children: N/A  . Years of Education: N/A   Social History Main Topics  . Smoking status: Former Smoker -- 1.00 packs/day for 25 years  . Smokeless tobacco: None  . Alcohol Use: Yes     Comment: Occasionally  . Drug Use: No  . Sexually Active:    Other Topics Concern  . None   Social History Narrative  . None    Review of Systems: Negative unless mentioned in HPI  Physical Exam: BP 145/88  Pulse 84  Temp(Src) 97.4 F (36.3 C) (Oral)  Ht 6' (1.829 m)  Wt 209 lb 9.6 oz (95.074 kg)  BMI 28.42 kg/m2 General:   Alert and oriented. No distress noted. Pleasant and cooperative.  Head:  Normocephalic and atraumatic. Eyes:  Conjuctiva clear without scleral  icterus. Mouth:  Oral mucosa pink and moist. Good dentition. No lesions. Heart:  S1, S2 present without murmurs, rubs, or gallops. Regular rate and rhythm. Abdomen:  +BS, soft, non-tender and non-distended. No rebound or guarding. Umbilical hernia noted.  Msk:  Symmetrical without gross deformities. Normal posture. Extremities:  Without edema. Neurologic:  Alert and  oriented x4;  grossly normal neurologically. Skin:  Intact without significant lesions or rashes. Psych:  Alert and cooperative. Normal mood and affect.

## 2012-10-08 NOTE — Addendum Note (Signed)
Addended by: Nira Retort on: 10/08/2012 10:11 AM   Modules accepted: Orders

## 2012-10-08 NOTE — Progress Notes (Signed)
After patient had already left, I was notified by Dr. Jena Gauss of labs that had been received recently that need to be addressed. This is documented in a phone note from 3/17.  Appears AST and ALT were 44, 58 respectively. Thrombocytopenia noted. He does have a history of Hep C, which was treated in the remote past.  Per recommendations: Please schedule [place] of abdomen Needs HFP now Keep appointment with Dr. Jena Gauss in May 2014.

## 2012-10-08 NOTE — Telephone Encounter (Signed)
Message copied by Myra Rude on Thu Oct 08, 2012 11:43 AM ------      Message from: Nira Retort      Created: Thu Oct 08, 2012 10:11 AM       After patient had already left, I was notified by Dr. Jena Gauss of labs that had been received recently that need to be addressed. This is documented in a phone note from 3/17. (I overlooked this)      Appears AST and ALT were 44, 58 respectively. Thrombocytopenia noted. He does have a history of Hep C, which was treated in the remote past.            Per recommendations:      Please schedule [place] of abdomen (i put in orders)      Needs HFP now (I put in orders)      Keep appointment with Dr. Jena Gauss in May 2014.       Please let him know I did not realize this at his visit! ------

## 2012-10-08 NOTE — Assessment & Plan Note (Signed)
Controlled with Aciphex. Continue.

## 2012-10-09 LAB — HEPATIC FUNCTION PANEL
Bilirubin, Direct: 0.1 mg/dL (ref 0.0–0.3)
Indirect Bilirubin: 0.5 mg/dL (ref 0.0–0.9)
Total Bilirubin: 0.6 mg/dL (ref 0.3–1.2)

## 2012-10-12 NOTE — Progress Notes (Signed)
Quick Note:  AST 44, ALT 58 previously. Now AST with slight improvement, ALT slightly higher at 64. Overall not impressive difference in prior labs. Awaiting [place] of abdomen. ______

## 2012-10-12 NOTE — Progress Notes (Signed)
CC PCP 

## 2012-10-13 ENCOUNTER — Ambulatory Visit (HOSPITAL_COMMUNITY)
Admission: RE | Admit: 2012-10-13 | Discharge: 2012-10-13 | Disposition: A | Discharge status: Home / Self Care | Payer: BC Managed Care – PPO | Source: Ambulatory Visit | Attending: Gastroenterology | Admitting: Gastroenterology

## 2012-10-13 DIAGNOSIS — R748 Abnormal levels of other serum enzymes: Secondary | ICD-10-CM | POA: Insufficient documentation

## 2012-10-13 DIAGNOSIS — E119 Type 2 diabetes mellitus without complications: Secondary | ICD-10-CM | POA: Insufficient documentation

## 2012-10-15 NOTE — Progress Notes (Signed)
Quick Note:  [place] of abdomen with questionable fatty liver, AST/ALT remain overall similar to prior results.  Will cc Dr. Jena Gauss for further recommendations; this nice gentleman has a history of Hep C in the past.   ______

## 2012-11-10 ENCOUNTER — Other Ambulatory Visit: Payer: Self-pay

## 2012-11-10 ENCOUNTER — Encounter: Payer: Self-pay | Admitting: Internal Medicine

## 2012-11-10 ENCOUNTER — Ambulatory Visit (INDEPENDENT_AMBULATORY_CARE_PROVIDER_SITE_OTHER): Payer: BC Managed Care – PPO | Admitting: Internal Medicine

## 2012-11-10 VITALS — BP 135/83 | HR 88 | Temp 97.6°F | Ht 72.0 in | Wt 216.2 lb

## 2012-11-10 DIAGNOSIS — K219 Gastro-esophageal reflux disease without esophagitis: Secondary | ICD-10-CM

## 2012-11-10 DIAGNOSIS — K760 Fatty (change of) liver, not elsewhere classified: Secondary | ICD-10-CM

## 2012-11-10 DIAGNOSIS — K7689 Other specified diseases of liver: Secondary | ICD-10-CM

## 2012-11-10 NOTE — Progress Notes (Signed)
Primary Care Physician:  Colette Ribas, MD Primary Gastroenterologist:  Dr. Jena Gauss  Pre-Procedure History & Physical: HPI:  Stephen Stephenson is a 62 y.o. male here for followup. History of GERD well-controlled on AcipHex. Recent food born illness with some residual intermittent postprandial abdominal cramps and nonbloody diarrhea-treated with occasional Bentyl. Mild elevation in transaminases. Long-standing type 2 diabetes mellitus. He is a good 40 pounds over his ideal body weight history of hepatitis C genotype 1 eradicated back in 2004 with a one-year course of antiviral therapy. Hep C PCR was negative some 13 months after completion of treatment. No alcohol these days (previously a heavy drinker in his younger years)-DWI 7. Ultrasound demonstrates fatty infiltration of the liver with a stable hepatic cyst. Prior liver biopsy report is not readily available for evaluation at this time. He is not thrombocytopenic. The spleen is not enlarged.  Past Medical History  Diagnosis Date  . GERD (gastroesophageal reflux disease)   . Hepatitis C     genotype 1 eradicated  . Hb-AS genotype   . DM (diabetes mellitus)     type II  . IBS (irritable bowel syndrome)   . Insomnia   . Kidney stone     Past Surgical History  Procedure Laterality Date  . Shoulder surgery    . Back surgery    . Colonoscopy  02/05/02    anal canal herrhoids ow normal/normal ileum/suspect pt bled from hemorrhoids presently  . Esophagogastroduodenoscopy  05/17/05    normal esophagus/gastric mucosa appeared normal  . Kidney stone surgery    . Colonoscopy  09/27/2011    [id number] hemorrhoids. Hepatic flexure polyp-removed TUBULAR ADENOMA, due for surveillance March 2016    Prior to Admission medications   Medication Sig Start Date End Date Taking? Authorizing Provider  aspirin EC 81 MG tablet Take 81 mg by mouth daily.   Yes Historical Provider, MD  RABEprazole (ACIPHEX) 20 MG tablet Take 1 tablet (20 mg total) by  mouth daily. 08/14/12  Yes Joselyn Arrow, NP  sitaGLIPtan-metformin (JANUMET) 50-1000 MG per tablet Take 1 tablet by mouth 2 (two) times daily with a meal.   Yes Historical Provider, MD  dicyclomine (BENTYL) 10 MG capsule Take 1 capsule (10 mg total) by mouth 4 (four) times daily. 10/08/12 10/08/13  Nira Retort, NP  Tamsulosin HCl (FLOMAX) 0.4 MG CAPS Take 2 capsules (0.8 mg total) by mouth at bedtime. 03/15/12   Kathlen Mody, MD  triazolam (HALCION) 0.25 MG tablet Take 0.25 mg by mouth at bedtime as needed. sleep    Historical Provider, MD    Allergies as of 11/10/2012  . (No Known Allergies)    Family History  Problem Relation Age of Onset  . Colon cancer Neg Hx     History   Social History  . Marital Status: Married    Spouse Name: N/A    Number of Children: N/A  . Years of Education: N/A   Occupational History  . Not on file.   Social History Main Topics  . Smoking status: Former Smoker -- 1.00 packs/day for 25 years  . Smokeless tobacco: Not on file  . Alcohol Use: Yes     Comment: Occasionally  . Drug Use: No  . Sexually Active:    Other Topics Concern  . Not on file   Social History Narrative  . No narrative on file    Review of Systems: See HPI, otherwise negative ROS  Physical Exam: BP 135/83  Pulse 88  Temp(Src) 97.6 F (36.4 C) (Oral)  Ht 6' (1.829 m)  Wt 216 lb 3.2 oz (98.068 kg)  BMI 29.32 kg/m2 General:   Alert,  Well-developed, well-nourished, pleasant and cooperative in NAD Skin:  Intact without significant lesions or rashes. Eyes:  Sclera clear, no icterus.   Conjunctiva pink. Ears:  Normal auditory acuity. Nose:  No deformity, discharge,  or lesions. Mouth:  No deformity or lesions. Neck:  Supple; no masses or thyromegaly. No significant cervical adenopathy. Lungs:  Clear throughout to auscultation.   No wheezes, crackles, or rhonchi. No acute distress. Heart:  Regular rate and rhythm; no murmurs, clicks, rubs,  or gallops. Abdomen: Mildly  obese. Positive bowel sounds  Soft and nontender without appreciable mass or hepatosplenomegaly.  Pulses:  Normal pulses noted. Extremities:  Without clubbing or edema.  Impression/Plan:  62 year old gentleman with GERD well-controlled. Recent infectious gastroenteritis likely with some residual postinfectious IBS. History of mildly elevated transaminases. No obvious medications to implicate. I suspect he has NAFLD with some degree of steatohepatitis. Metabolic syndrome. Recommendations: Continue his current medical regimen. Weight loss 10% of body weight advised (specifically 20 pounds in the next year). Increase aerobic exercise.  LFTs/office visit in 3 months. Will retrieve old liver biopsy report for review in the interim.

## 2012-11-10 NOTE — Patient Instructions (Addendum)
Continue Aciphex  loose about 10% of you body weight (20 pounds in the next year)  Hepatic profile in 3 months / office visit in 3 months  Continue Bentyl as needed for occasional diarrhea

## 2012-11-20 ENCOUNTER — Ambulatory Visit: Payer: BC Managed Care – PPO | Admitting: Internal Medicine

## 2013-01-05 ENCOUNTER — Other Ambulatory Visit: Payer: Self-pay

## 2013-01-20 ENCOUNTER — Emergency Department (HOSPITAL_COMMUNITY)
Admission: EM | Admit: 2013-01-20 | Discharge: 2013-01-21 | Disposition: A | Discharge status: Home / Self Care | Payer: BC Managed Care – PPO | Attending: Emergency Medicine | Admitting: Emergency Medicine

## 2013-01-20 ENCOUNTER — Encounter (HOSPITAL_COMMUNITY): Payer: Self-pay | Admitting: Emergency Medicine

## 2013-01-20 DIAGNOSIS — N200 Calculus of kidney: Secondary | ICD-10-CM

## 2013-01-20 DIAGNOSIS — Z8619 Personal history of other infectious and parasitic diseases: Secondary | ICD-10-CM | POA: Insufficient documentation

## 2013-01-20 DIAGNOSIS — E119 Type 2 diabetes mellitus without complications: Secondary | ICD-10-CM | POA: Insufficient documentation

## 2013-01-20 DIAGNOSIS — R112 Nausea with vomiting, unspecified: Secondary | ICD-10-CM | POA: Insufficient documentation

## 2013-01-20 DIAGNOSIS — Z79899 Other long term (current) drug therapy: Secondary | ICD-10-CM | POA: Insufficient documentation

## 2013-01-20 DIAGNOSIS — Z7982 Long term (current) use of aspirin: Secondary | ICD-10-CM | POA: Insufficient documentation

## 2013-01-20 DIAGNOSIS — Z8719 Personal history of other diseases of the digestive system: Secondary | ICD-10-CM | POA: Insufficient documentation

## 2013-01-20 DIAGNOSIS — Z862 Personal history of diseases of the blood and blood-forming organs and certain disorders involving the immune mechanism: Secondary | ICD-10-CM | POA: Insufficient documentation

## 2013-01-20 DIAGNOSIS — Z87891 Personal history of nicotine dependence: Secondary | ICD-10-CM | POA: Insufficient documentation

## 2013-01-20 DIAGNOSIS — K219 Gastro-esophageal reflux disease without esophagitis: Secondary | ICD-10-CM | POA: Insufficient documentation

## 2013-01-20 DIAGNOSIS — Z9889 Other specified postprocedural states: Secondary | ICD-10-CM | POA: Insufficient documentation

## 2013-01-20 LAB — URINALYSIS, ROUTINE W REFLEX MICROSCOPIC
Glucose, UA: 100 mg/dL — AB
Ketones, ur: NEGATIVE mg/dL
Protein, ur: NEGATIVE mg/dL
pH: 5 (ref 5.0–8.0)

## 2013-01-20 LAB — URINE MICROSCOPIC-ADD ON

## 2013-01-20 MED ORDER — SODIUM CHLORIDE 0.9 % IV BOLUS (SEPSIS)
500.0000 mL | Freq: Once | INTRAVENOUS | Status: AC
  Administered 2013-01-20: 500 mL via INTRAVENOUS

## 2013-01-20 MED ORDER — KETOROLAC TROMETHAMINE 30 MG/ML IJ SOLN
30.0000 mg | Freq: Once | INTRAMUSCULAR | Status: AC
  Administered 2013-01-20: 30 mg via INTRAVENOUS
  Filled 2013-01-20: qty 1

## 2013-01-20 MED ORDER — ONDANSETRON HCL 4 MG/2ML IJ SOLN
4.0000 mg | Freq: Once | INTRAMUSCULAR | Status: AC
  Administered 2013-01-20: 4 mg via INTRAVENOUS
  Filled 2013-01-20: qty 2

## 2013-01-20 MED ORDER — OXYCODONE-ACETAMINOPHEN 5-325 MG PO TABS: 2.0000 | ORAL_TABLET | ORAL | Status: DC | PRN

## 2013-01-20 MED ORDER — PROMETHAZINE HCL 25 MG PO TABS: 25.0000 mg | ORAL_TABLET | Freq: Four times a day (QID) | ORAL | Status: DC | PRN

## 2013-01-20 MED ORDER — HYDROMORPHONE HCL PF 1 MG/ML IJ SOLN
1.0000 mg | Freq: Once | INTRAMUSCULAR | Status: AC
  Administered 2013-01-20: 1 mg via INTRAVENOUS
  Filled 2013-01-20: qty 1

## 2013-01-20 MED ORDER — TAMSULOSIN HCL 0.4 MG PO CAPS: 0.4000 mg | ORAL_CAPSULE | Freq: Every day | ORAL | Status: DC

## 2013-01-20 NOTE — ED Notes (Signed)
Onset pain left flank and around left abdomen  Vomited last pm and today.  States pain is constant and has not stopped

## 2013-01-20 NOTE — ED Notes (Signed)
Pt c/o left flank and left abdominal pain since last night. Pt reports episodes of vomiting last night and today. Pt has hx of kidney stones and states these symptoms feel similar to past kidney stone episodes.

## 2013-01-20 NOTE — ED Provider Notes (Signed)
History  This chart was scribed for Donnetta Hutching, MD by Ardelia Mems, ED Scribe. This patient was seen in room APA19/AP[facility]19 and the patient's care was started at 10:19 PM.  CSN: [id number]  Arrival date & time 01/20/13  2137   Chief Complaint  Patient presents with  . Flank Pain    states he knows it is a kidney stone    The history is provided by the patient. No language interpreter was used.   HPI Comments: Stephen Stephenson is a 62 y.o. male with a hx of kidney stones, IBS, DM and hepatitis C who presents to the Emergency Department complaining of constant, severe LLQ abdominal pain that began as left flank pain. Pt strongly believes he has a kidney stone. Pt states that he has had hx of 3 kidney stones and states that this pain is highly similar. Pt states that his last kidney stone was in September 2013. He states that he has passed 1 of 3 kidney stones, and the other 2 have required surgical intervention. Pt reports associated nausea and vomiting last night and today. Pt denies dysuria, hematuria, diarrhea or any other symptoms. Pt is an occasional alcohol user and a former smoker of 1 pack/day of 25 years.  PCP- Dr. Assunta Found   Past Medical History  Diagnosis Date  . GERD (gastroesophageal reflux disease)   . Hepatitis C     genotype 1 eradicated  . Hb-AS genotype   . DM (diabetes mellitus)     type II  . IBS (irritable bowel syndrome)   . Insomnia   . Kidney stone    Past Surgical History  Procedure Laterality Date  . Shoulder surgery    . Back surgery    . Colonoscopy  02/05/02    anal canal herrhoids ow normal/normal ileum/suspect pt bled from hemorrhoids presently  . Esophagogastroduodenoscopy  05/17/05    normal esophagus/gastric mucosa appeared normal  . Kidney stone surgery    . Colonoscopy  09/27/2011    [id number] hemorrhoids. Hepatic flexure polyp-removed TUBULAR ADENOMA, due for surveillance March 2016   Family History  Problem Relation Age of Onset  .  Colon cancer Neg Hx    History  Substance Use Topics  . Smoking status: Former Smoker -- 1.00 packs/day for 25 years  . Smokeless tobacco: Not on file  . Alcohol Use: Yes     Comment: Occasionally    Review of Systems  Constitutional: Negative for fever and chills.  HENT: Negative for congestion, sore throat, rhinorrhea and neck pain.   Eyes: Negative for visual disturbance.  Respiratory: Negative for cough and shortness of breath.   Cardiovascular: Negative for chest pain.  Gastrointestinal: Positive for nausea, vomiting and abdominal pain. Negative for diarrhea.  Genitourinary: Positive for flank pain. Negative for dysuria and hematuria.  Musculoskeletal: Negative for back pain.  Skin: Negative for rash.  Neurological: Negative for headaches.  Psychiatric/Behavioral: Negative for confusion.  A complete 10 system review of systems was obtained and all systems are negative except as noted in the HPI and PMH.   Allergies  Review of patient's allergies indicates no known allergies.  Home Medications   Current Outpatient Rx  Name  Route  Sig  Dispense  Refill  . aspirin EC 81 MG tablet   Oral   Take 81 mg by mouth daily.         . RABEprazole (ACIPHEX) 20 MG tablet   Oral   Take 1 tablet (20 mg total) by  mouth daily.   31 tablet   11   . sitaGLIPtan-metformin (JANUMET) 50-1000 MG per tablet   Oral   Take 1 tablet by mouth 2 (two) times daily with a meal.          Triage Vitals: BP 139/86  Pulse 105  Temp(Src) 97.5 F (36.4 C) (Oral)  Resp 24  Ht 6' (1.829 m)  Wt 220 lb (99.791 kg)  BMI 29.83 kg/m2  SpO2 98%  Physical Exam  Nursing note and vitals reviewed. Constitutional: He is oriented to person, place, and time. He appears well-developed and well-nourished.  HENT:  Head: Normocephalic and atraumatic.  Eyes: Conjunctivae and EOM are normal. Pupils are equal, round, and reactive to light.  Neck: Normal range of motion. Neck supple.  Cardiovascular:  Normal rate, regular rhythm and normal heart sounds.   Pulmonary/Chest: Effort normal and breath sounds normal.  Abdominal: Soft. Bowel sounds are normal.  Musculoskeletal: Normal range of motion.  Neurological: He is alert and oriented to person, place, and time.  Skin: Skin is warm and dry.  Psychiatric: He has a normal mood and affect.    ED Course  Procedures (including critical care time)  DIAGNOSTIC STUDIES: Oxygen Saturation is 98% on RA, normal by my interpretation.    COORDINATION OF CARE 10:27 PM- Pt advised of plan to receive pain medication and urinalysis and pt agrees.  Medications  sodium chloride 0.9 % bolus 500 mL (500 mLs Intravenous New Bag/Given 01/20/13 2242)  ketorolac (TORADOL) 30 MG/ML injection 30 mg (30 mg Intravenous Given 01/20/13 2243)  HYDROmorphone (DILAUDID) injection 1 mg (1 mg Intravenous Given 01/20/13 2244)  ondansetron (ZOFRAN) injection 4 mg (4 mg Intravenous Given 01/20/13 2242)    Labs Reviewed  URINALYSIS, ROUTINE W REFLEX MICROSCOPIC - Abnormal; Notable for the following:    Specific Gravity, Urine >1.030 (*)    Glucose, UA 100 (*)    Hgb urine dipstick LARGE (*)    All other components within normal limits  URINE MICROSCOPIC-ADD ON - Abnormal; Notable for the following:    Squamous Epithelial / LPF FEW (*)    Bacteria, UA FEW (*)    All other components within normal limits   No results found.  No diagnosis found.  MDM  History and physical consistent with kidney stone. Patient feels much better after pain management. I withheld CT scan secondary to radiation risk. Patient will get urology followup. Discharge meds Percocet, Phenergan 25 mg, Flomax 0.4 mg        I personally performed the services described in this documentation, which was scribed in my presence. The recorded information has been reviewed and is accurate.    Donnetta Hutching, MD 01/20/13 [phone]

## 2013-01-28 ENCOUNTER — Other Ambulatory Visit (HOSPITAL_COMMUNITY): Payer: Self-pay | Admitting: Urology

## 2013-01-28 ENCOUNTER — Ambulatory Visit (HOSPITAL_COMMUNITY)
Admission: RE | Admit: 2013-01-28 | Discharge: 2013-01-28 | Disposition: A | Discharge status: Home / Self Care | Payer: BC Managed Care – PPO | Source: Ambulatory Visit | Attending: Urology | Admitting: Urology

## 2013-01-28 DIAGNOSIS — N200 Calculus of kidney: Secondary | ICD-10-CM

## 2013-01-29 ENCOUNTER — Encounter (HOSPITAL_COMMUNITY): Payer: Self-pay | Admitting: Pharmacy Technician

## 2013-02-01 ENCOUNTER — Encounter (HOSPITAL_COMMUNITY): Admission: RE | Admit: 2013-02-01 | Payer: BC Managed Care – PPO | Source: Ambulatory Visit

## 2013-02-02 ENCOUNTER — Encounter (HOSPITAL_COMMUNITY)
Admission: RE | Admit: 2013-02-02 | Discharge: 2013-02-02 | Disposition: A | Discharge status: Home / Self Care | Payer: BC Managed Care – PPO | Source: Ambulatory Visit | Attending: Urology | Admitting: Urology

## 2013-02-02 ENCOUNTER — Encounter (HOSPITAL_COMMUNITY): Payer: Self-pay

## 2013-02-02 NOTE — Patient Instructions (Addendum)
TREVON STROTHERS  02/02/2013   Your procedure is scheduled on:  Wed., July23  Report to [facility] at  12:30PM  Call this number if you have problems the morning of surgery: [phone]   Remember:   Do not eat food or drink liquids after midnight.   Take these medicines the morning of surgery with A SIP OF WATER: Percocet, Phenergan,Aciphex   Do not wear jewelry, make-up or nail polish.  Do not wear lotions, powders, or perfumes. You may wear deodorant.  Do not shave 48 hours prior to surgery. Men may shave face and neck.  Do not bring valuables to the hospital.  [facility] is not responsible                   for any belongings or valuables.  Contacts, dentures or bridgework may not be worn into surgery.  Leave suitcase in the car. After surgery it may be brought to your room.  For patients admitted to the hospital, checkout time is 11:00 AM the day of  discharge.   Patients discharged the day of surgery will not be allowed to drive  home.  Name and phone number of your driver: FAMILY  Special Instructions: N/A   Please read over the following fact sheets that you were given: Pain Booklet, Coughing and Deep Breathing, MRSA Information, Surgical Site Infection Prevention, Anesthesia Post-op Instructions and Care and Recovery After Surgery  PATIENT INSTRUCTIONS POST-ANESTHESIA  IMMEDIATELY FOLLOWING SURGERY:  Do not drive or operate machinery for the first twenty four hours after surgery.  Do not make any important decisions for twenty four hours after surgery or while taking narcotic pain medications or sedatives.  If you develop intractable nausea and vomiting or a severe headache please notify your doctor immediately.  FOLLOW-UP:  Please make an appointment with your surgeon as instructed. You do not need to follow up with anesthesia unless specifically instructed to do so.  WOUND CARE INSTRUCTIONS (if applicable):  Keep a dry clean dressing on the anesthesia/puncture wound site if  there is drainage.  Once the wound has quit draining you may leave it open to air.  Generally you should leave the bandage intact for twenty four hours unless there is drainage.  If the epidural site drains for more than 36-48 hours please call the anesthesia department.  QUESTIONS?:  Please feel free to call your physician or the hospital operator if you have any questions, and they will be happy to assist you.     Lithotripsy for Kidney Stones WHAT ARE KIDNEY STONES? The kidneys filter blood for chemicals the body cannot use. These waste chemicals are eliminated in the urine. They are removed from the body. Under some conditions, these chemicals may become concentrated. When this happens, they form crystals in the urine. When these crystals build up and stick together, stones may form. When these stones block the flow of urine through the urinary tract, they may cause severe pain. The urinary tract is very sensitive to blockage and stretching by the stone. WHAT IS LITHOTRIPSY? Lithotripsy is a treatment that can sometimes help eliminate kidney stones and pain faster. A form of lithotripsy, also known as ESWL (extracorporeal shock wave lithotripsy), is a nonsurgical procedure that helps your body rid itself of the kidney stone with a minimum amount of pain. EWSL is a method of crushing a kidney stone with shock waves. These shock waves pass through your body. They cause the kidney stones to crumble while still  in the urinary tract. It is then easier for the smaller pieces of stone to pass in the urine. Lithotripsy usually takes about an hour. It is done in a hospital, a lithotripsy center, or a mobile unit. It usually does not require an overnight stay. Your caregiver will instruct you on preparation for the procedure. Your caregiver will tell you what to expect afterward. LET YOUR CAREGIVER KNOW ABOUT:  Allergies.  Medicines taken including herbs, eye drops, over the counter medicines (including  aspirin, aleve, or motrin for treatment of inflammatory conditions) and creams.  Use of steroids (by mouth or creams).  Previous problems with anesthetics or novocaine.  Possibility of pregnancy, if this applies.  History of blood clots (thrombophlebitis).  History of bleeding or blood problems.  Previous surgery.  Other health problems. RISKS AND COMPLICATIONS Complications of lithotripsy are uncommon, but include the following:  Infection.  Bleeding of the kidney.  Bruising of the kidney or skin.  Obstruction of the ureter (the passageway from the kidney to the bladder).  Failure of the stone to fragment (break apart). PROCEDURE A stent (flexible tube with holes) may be placed in your ureter. The ureter is the tube that transports the urine from the kidneys to the bladder. Your caregiver may place a stent before the procedure. This will help keep urine flowing from the kidney if the fragments of the stone block the ureter. You may receive an intravenous (IV) line to give you fluids and medicines. These medicines may help you relax or make you sleep. During the procedure, you will lie comfortably on a fluid-filled cushion or in a warm-water bath. After an x-ray or ultrasound locates your stone, shock waves are aimed at the stone. If you are awake, you may feel a tapping sensation (feeling) as the shock waves pass through your body. If large stone particles remain after treatment, a second procedure may be necessary at a later date. For comfort during the test:  Relax as much as possible.  Try to remain still as much as possible.  Try to follow instructions to speed up the test.  Let your caregiver know if you are uncomfortable, anxious, or in pain. AFTER THE PROCEDURE  After surgery, you will be taken to the recovery area. A nurse will watch and check your progress. Once you're awake, stable, and taking fluids well, you will be allowed to go home as long as there are no  problems. You may be prescribed antibiotics (medicines that kill germs) to help prevent infection. You may also be prescribed pain medicine if needed. In a week or two, your doctor may remove your stent, if you have one. Your caregiver will check to see whether or not stone particles remain. PASSING THE STONE It may take anywhere from a day to several weeks for the stone particles to leave your body. During this time, drink at least 8 to 12 eight ounce glasses of water every day. It is normal for your urine to be cloudy or slightly bloody for a few weeks following this procedure. You may even see small pieces of stone in your urine. A slight fever and some pain are also normal. Your caregiver may ask you to strain your urine to collect some stone particles for chemical analysis. If you find particles while straining the urine, save them. Analysis tells you and the caregiver what the stone is made of. Knowing this may help prevent future stones. PREVENTING FUTURE STONES  Drink about 8 to 12, eight-ounce  glasses of water every day.  Follow the diet your caregiver recommends.  Take your prescribed medicine.  See your caregiver regularly for checkups. SEEK IMMEDIATE MEDICAL CARE IF:  You develop an oral temperature above 102 F (38.9 C), or as your caregiver suggests.  Your pain is not relieved by medicine.  You develop nausea (feeling sick to your stomach) and vomiting.  You develop heavy bleeding.  You have difficulty urinating. Document Released: 06/28/2000 Document Revised: 09/23/2011 Document Reviewed: 04/22/2008 [facility] Patient Information 2014 [place], [place].

## 2013-02-03 ENCOUNTER — Ambulatory Visit (HOSPITAL_COMMUNITY)
Admission: RE | Admit: 2013-02-03 | Discharge: 2013-02-03 | Disposition: A | Discharge status: Home / Self Care | Payer: BC Managed Care – PPO | Source: Ambulatory Visit | Attending: Urology | Admitting: Urology

## 2013-02-03 ENCOUNTER — Encounter (HOSPITAL_COMMUNITY): Payer: Self-pay | Admitting: Anesthesiology

## 2013-02-03 ENCOUNTER — Ambulatory Visit (HOSPITAL_COMMUNITY): Payer: BC Managed Care – PPO

## 2013-02-03 ENCOUNTER — Ambulatory Visit (HOSPITAL_COMMUNITY): Payer: BC Managed Care – PPO | Admitting: Anesthesiology

## 2013-02-03 ENCOUNTER — Encounter (HOSPITAL_COMMUNITY)
Admission: RE | Disposition: A | Discharge status: Home / Self Care | Payer: Self-pay | Source: Ambulatory Visit | Attending: Urology

## 2013-02-03 ENCOUNTER — Encounter (HOSPITAL_COMMUNITY): Payer: Self-pay | Admitting: *Deleted

## 2013-02-03 ENCOUNTER — Ambulatory Visit: Admit: 2013-02-03 | Payer: Self-pay | Admitting: Urology

## 2013-02-03 ENCOUNTER — Other Ambulatory Visit: Payer: Self-pay

## 2013-02-03 DIAGNOSIS — Z01812 Encounter for preprocedural laboratory examination: Secondary | ICD-10-CM | POA: Insufficient documentation

## 2013-02-03 DIAGNOSIS — N201 Calculus of ureter: Secondary | ICD-10-CM | POA: Insufficient documentation

## 2013-02-03 DIAGNOSIS — E119 Type 2 diabetes mellitus without complications: Secondary | ICD-10-CM | POA: Insufficient documentation

## 2013-02-03 HISTORY — PX: CYSTOSCOPY W/ URETERAL STENT PLACEMENT: SHX1429

## 2013-02-03 HISTORY — PX: EXTRACORPOREAL SHOCK WAVE LITHOTRIPSY: SHX1557

## 2013-02-03 LAB — HEPATIC FUNCTION PANEL
ALT: 47 U/L (ref 0–53)
AST: 61 U/L — ABNORMAL HIGH (ref 0–37)
Alkaline Phosphatase: 41 U/L (ref 39–117)
Bilirubin, Direct: 0.1 mg/dL (ref 0.0–0.3)
Indirect Bilirubin: 0.4 mg/dL (ref 0.0–0.9)
Total Bilirubin: 0.5 mg/dL (ref 0.3–1.2)

## 2013-02-03 LAB — POCT I-STAT, CHEM 8
BUN: 27 mg/dL — ABNORMAL HIGH (ref 6–23)
Chloride: 106 mEq/L (ref 96–112)
Creatinine, Ser: 1.7 mg/dL — ABNORMAL HIGH (ref 0.50–1.35)
Potassium: 4.6 mEq/L (ref 3.5–5.1)
Sodium: 139 mEq/L (ref 135–145)

## 2013-02-03 LAB — GLUCOSE, CAPILLARY: Glucose-Capillary: 110 mg/dL — ABNORMAL HIGH (ref 70–99)

## 2013-02-03 SURGERY — LITHOTRIPSY, ESWL
Anesthesia: Moderate Sedation | Laterality: Left

## 2013-02-03 SURGERY — CYSTOSCOPY, WITH RETROGRADE PYELOGRAM AND URETERAL STENT INSERTION
Anesthesia: General | Site: Ureter | Laterality: Left | Wound class: Clean Contaminated

## 2013-02-03 MED ORDER — PROPOFOL 10 MG/ML IV BOLUS
INTRAVENOUS | Status: DC | PRN
  Administered 2013-02-03: 150 mg via INTRAVENOUS

## 2013-02-03 MED ORDER — ONDANSETRON HCL 4 MG/2ML IJ SOLN
INTRAMUSCULAR | Status: DC | PRN
  Administered 2013-02-03: 4 mg via INTRAVENOUS

## 2013-02-03 MED ORDER — OXYCODONE-ACETAMINOPHEN 5-325 MG PO TABS
1.0000 | ORAL_TABLET | Freq: Once | ORAL | Status: AC
  Administered 2013-02-03: 1 via ORAL

## 2013-02-03 MED ORDER — MIDAZOLAM HCL 5 MG/5ML IJ SOLN
INTRAMUSCULAR | Status: DC | PRN
  Administered 2013-02-03: 2 mg via INTRAVENOUS

## 2013-02-03 MED ORDER — FENTANYL CITRATE 0.05 MG/ML IJ SOLN
INTRAMUSCULAR | Status: DC | PRN
  Administered 2013-02-03: 25 ug via INTRAVENOUS
  Administered 2013-02-03: 50 ug via INTRAVENOUS
  Administered 2013-02-03: 25 ug via INTRAVENOUS

## 2013-02-03 MED ORDER — DIAZEPAM 5 MG PO TABS
ORAL_TABLET | ORAL | Status: AC
  Filled 2013-02-03: qty 2

## 2013-02-03 MED ORDER — LACTATED RINGERS IV SOLN
INTRAVENOUS | Status: DC
  Administered 2013-02-03: 17:00:00 via INTRAVENOUS

## 2013-02-03 MED ORDER — DIPHENHYDRAMINE HCL 25 MG PO CAPS
25.0000 mg | ORAL_CAPSULE | Freq: Once | ORAL | Status: AC
  Administered 2013-02-03: 25 mg via ORAL

## 2013-02-03 MED ORDER — SODIUM CHLORIDE 0.9 % IV SOLN
INTRAVENOUS | Status: DC
  Administered 2013-02-03: 15:00:00 via INTRAVENOUS

## 2013-02-03 MED ORDER — DIAZEPAM 5 MG PO TABS
10.0000 mg | ORAL_TABLET | Freq: Once | ORAL | Status: AC
  Administered 2013-02-03: 10 mg via ORAL

## 2013-02-03 MED ORDER — OXYCODONE-ACETAMINOPHEN 5-325 MG PO TABS
ORAL_TABLET | ORAL | Status: AC
  Filled 2013-02-03: qty 1

## 2013-02-03 MED ORDER — MIDAZOLAM HCL 2 MG/2ML IJ SOLN
INTRAMUSCULAR | Status: AC
  Filled 2013-02-03: qty 2

## 2013-02-03 MED ORDER — LIDOCAINE HCL (PF) 1 % IJ SOLN
INTRAMUSCULAR | Status: AC
  Filled 2013-02-03: qty 5

## 2013-02-03 MED ORDER — IOHEXOL 350 MG/ML SOLN
INTRAVENOUS | Status: DC | PRN
  Administered 2013-02-03: 50 mL via INTRAVENOUS

## 2013-02-03 MED ORDER — STERILE WATER FOR IRRIGATION IR SOLN
Status: DC | PRN
  Administered 2013-02-03: 200 mL

## 2013-02-03 MED ORDER — LIDOCAINE HCL 1 % IJ SOLN
INTRAMUSCULAR | Status: DC | PRN
  Administered 2013-02-03: 50 mg via INTRADERMAL

## 2013-02-03 MED ORDER — DIPHENHYDRAMINE HCL 25 MG PO CAPS
ORAL_CAPSULE | ORAL | Status: AC
  Filled 2013-02-03: qty 1

## 2013-02-03 MED ORDER — FENTANYL CITRATE 0.05 MG/ML IJ SOLN
INTRAMUSCULAR | Status: AC
  Filled 2013-02-03: qty 2

## 2013-02-03 MED ORDER — PROPOFOL 10 MG/ML IV EMUL
INTRAVENOUS | Status: AC
  Filled 2013-02-03: qty 20

## 2013-02-03 SURGICAL SUPPLY — 21 items
BAG DRAIN URO TABLE W/ADPT NS (DRAPE) ×2 IMPLANT
BAG DRN 8 ADPR NS SKTRN CSTL (DRAPE) ×1
CATH 5 FR WEDGE TIP (UROLOGICAL SUPPLIES) ×2 IMPLANT
CATH OPEN TIP 5FR (CATHETERS) ×2 IMPLANT
CLOTH BEACON ORANGE TIMEOUT ST (SAFETY) ×2 IMPLANT
DILATOR UROMAX ULTRA (MISCELLANEOUS) IMPLANT
GLOVE BIO SURGEON STRL SZ7 (GLOVE) ×2 IMPLANT
GLOVE SS BIOGEL STRL SZ 6.5 (GLOVE) IMPLANT
GLOVE SUPERSENSE BIOGEL SZ 6.5 (GLOVE) ×1
GOWN STRL REIN XL XLG (GOWN DISPOSABLE) ×2 IMPLANT
GUIDEWIRE STR BENTSON 035X150 (WIRE) ×1 IMPLANT
IV NS IRRIG 3000ML ARTHROMATIC (IV SOLUTION) ×4 IMPLANT
KIT ROOM TURNOVER AP CYSTO (KITS) ×2 IMPLANT
MANIFOLD NEPTUNE II (INSTRUMENTS) ×2 IMPLANT
PACK CYSTO (CUSTOM PROCEDURE TRAY) ×2 IMPLANT
PAD ARMBOARD 7.5X6 YLW CONV (MISCELLANEOUS) ×2 IMPLANT
SET IRRIGATING DISP (SET/KITS/TRAYS/PACK) ×2 IMPLANT
STENT PERCUFLEX 4.8FRX24 (STENTS) ×1 IMPLANT
STONE RETRIEVAL GEMINI 2.4 FR (MISCELLANEOUS) IMPLANT
TOWEL OR 17X26 4PK STRL BLUE (TOWEL DISPOSABLE) ×2 IMPLANT
WIRE GUIDE BENTSON .035 15CM (WIRE) ×2 IMPLANT

## 2013-02-03 SURGICAL SUPPLY — 4 items
CLOTH BEACON ORANGE TIMEOUT ST (SAFETY) IMPLANT
GOWN STRL REIN XL XLG (GOWN DISPOSABLE) IMPLANT
SET IRRIGATING DISP (SET/KITS/TRAYS/PACK) IMPLANT
TOWEL OR 17X26 4PK STRL BLUE (TOWEL DISPOSABLE) IMPLANT

## 2013-02-03 NOTE — Anesthesia Preprocedure Evaluation (Addendum)
Anesthesia Evaluation   Patient awake    Reviewed: Allergy & Precautions, H&P , NPO status , Patient's Chart, lab work & pertinent test results, reviewed documented beta blocker date and time   Airway Mallampati: I TM Distance: >3 FB Neck ROM: Full    Dental  (+) Teeth Intact, Chipped, Missing and Poor Dentition,    Pulmonary neg pulmonary ROS,    Pulmonary exam normal       Cardiovascular Exercise Tolerance: Good Rhythm:Regular Rate:Normal     Neuro/Psych    GI/Hepatic GERD-  Medicated and Controlled,(+) Hepatitis -, C  Endo/Other  diabetes, Type 2, Oral Hypoglycemic Agents  Renal/GU      Musculoskeletal   Abdominal (+)  Abdomen: soft. Bowel sounds: normal.  Peds  Hematology   Anesthesia Other Findings   Reproductive/Obstetrics                          Anesthesia Physical Anesthesia Plan  ASA: II and emergent  Anesthesia Plan: General   Post-op Pain Management:    Induction: Intravenous  Airway Management Planned: LMA  Additional Equipment:   Intra-op Plan:   Post-operative Plan: Extubation in OR  Informed Consent:   Plan Discussed with: CRNA and Anesthesiologist  Anesthesia Plan Comments:         Anesthesia Quick Evaluation

## 2013-02-03 NOTE — Progress Notes (Signed)
To Bathroom. Sitting on toilet. Voided without difficulty.

## 2013-02-03 NOTE — Progress Notes (Signed)
No change in H&P on reexamination. He was scheduled to have esl but stone could not be found evan with dye no dye was coming out even for over 1 hour so idecided to drain l kidney with double j stent . i discussed all various choices he elected to have double j stent.

## 2013-02-03 NOTE — Progress Notes (Signed)
States pain med effective. Wants to go home. Rates pain 2. To BR. Voiding without difficulty. Dressed self.

## 2013-02-03 NOTE — Progress Notes (Signed)
To recliner. C/O postop left flank pain. Rates pain 4. Diet ginger-ale and saltines given. Tolerated well. Wife at side.

## 2013-02-03 NOTE — Anesthesia Procedure Notes (Signed)
Procedure Name: LMA Insertion Date/Time: 02/03/2013 5:33 PM Performed by: Despina Hidden Pre-anesthesia Checklist: Emergency Drugs available, Patient identified, Patient being monitored and Suction available Patient Re-evaluated:Patient Re-evaluated prior to inductionOxygen Delivery Method: Circle system utilized Preoxygenation: Pre-oxygenation with 100% oxygen Intubation Type: IV induction Ventilation: Mask ventilation without difficulty LMA: LMA inserted LMA Size: 4.0 Tube type: Oral Number of attempts: 1 Placement Confirmation: positive ETCO2 and breath sounds checked- equal and bilateral Tube secured with: Tape Dental Injury: Teeth and Oropharynx as per pre-operative assessment

## 2013-02-03 NOTE — Transfer of Care (Signed)
Immediate Anesthesia Transfer of Care Note  Patient: Stephen Stephenson  Procedure(s) Performed: Procedure(s): CYSTOSCOPY WITH RETROGRADE PYELOGRAM/URETERAL STENT PLACEMENT (Left)  Patient Location: PACU  Anesthesia Type:General  Level of Consciousness: awake and patient cooperative  Airway & Oxygen Therapy: Patient Spontanous Breathing and Patient connected to face mask oxygen  Post-op Assessment: Report given to PACU RN, Post -op Vital signs reviewed and stable and Patient moving all extremities  Post vital signs: Reviewed and stable  Complications: No apparent anesthesia complications

## 2013-02-03 NOTE — Brief Op Note (Signed)
02/03/2013  6:06 PM  PATIENT:  [name]  62 y.o. male  PRE-OPERATIVE DIAGNOSIS:  left ureteral calculus  POST-OPERATIVE DIAGNOSIS:  left ureteral calculus  PROCEDURE:  Procedure(s): CYSTOSCOPY WITH RETROGRADE PYELOGRAM/URETERAL STENT PLACEMENT (Left)  SURGEON:  Surgeon(s) and Role:    * Ky Barban, MD - Primary  PHYSICIAN ASSISTANT:   ASSISTANTS: none   ANESTHESIA:   general  EBL:  Total I/O In: 800 [I.V.:800] Out: -   BLOOD ADMINISTERED:none  DRAINS:    LOCAL MEDICATIONS USED:  NONE  SPECIMEN:  No Specimen  DISPOSITION OF SPECIMEN:  N/A  COUNTS:  YES  TOURNIQUET:  * No tourniquets in log *  DICTATION: .Other Dictation: Dictation Number dictation [id number]  PLAN OF CARE: Discharge to home after PACU  PATIENT DISPOSITION:  PACU - hemodynamically stable.   Delay start of Pharmacological VTE agent (>24hrs) due to surgical blood loss or risk of bleeding:

## 2013-02-03 NOTE — Progress Notes (Signed)
Awake. Needs to void. Urinal provided. Scant amt bloody drainage from meatus. Denies pain.

## 2013-02-03 NOTE — Progress Notes (Signed)
Unable to use urinal. Wants to go to BR.

## 2013-02-03 NOTE — Progress Notes (Signed)
Voided 100 ml clear pink urine in urinal.

## 2013-02-03 NOTE — Anesthesia Postprocedure Evaluation (Signed)
  Anesthesia Post-op Note  Patient: Stephen Stephenson  Procedure(s) Performed: Procedure(s): CYSTOSCOPY WITH RETROGRADE PYELOGRAM/URETERAL STENT PLACEMENT (Left)  Patient Location: PACU  Anesthesia Type:General  Level of Consciousness: awake, alert , oriented and patient cooperative  Airway and Oxygen Therapy: Patient Spontanous Breathing  Post-op Pain: none  Post-op Assessment: Post-op Vital signs reviewed, Patient's Cardiovascular Status Stable, Respiratory Function Stable, Patent Airway, No signs of Nausea or vomiting and Pain level controlled  Post-op Vital Signs: Reviewed and stable  Complications: No apparent anesthesia complications

## 2013-02-04 ENCOUNTER — Encounter (HOSPITAL_COMMUNITY): Payer: Self-pay | Admitting: Urology

## 2013-02-04 NOTE — H&P (Signed)
Stephen Stephenson, Stephen Stephenson NO.:  [id number]  MEDICAL RECORD NO.:  [id number]  LOCATION:                                 FACILITY:  PHYSICIAN:  Ky Barban, M.D.DATE OF BIRTH:  August 09, 1950  DATE OF ADMISSION:  02/03/2013 DATE OF DISCHARGE:  LH                             HISTORY & PHYSICAL   CHIEF COMPLAINT:  Recurrent left renal colic.  HISTORY OF PRESENT ILLNESS:  A 62 year old gentleman was seen by me in the office.  He was seen by me on July 10 with history of having left renal colic, and he had same problem in 2013 and he had a 4.2 mm stone in the left ureterovesical junction, which he subsequently passed in the hospital stay.  He had another episode just recently and went to the emergency room.  CT scan was not done, but he felt like similar exact pain on the left side.  No fever or chills.  He is still having nausea and also having pain on that side on and off.  So, I went ahead and did a CT scan, it shows there is a 4 mm stone in the lower pole of the left kidney.  It is visible on KUB, but no ureteral calculus.  I told them this is nonobstructing calculus, usually this is not a very big stone. We do not do anything usually for this type of stone, but he is having pain.  He is convinced it is coming from the stone and I am not convinced that it is causing any pain, but unless we get rid of it, the problem will linger on, so I decided to do lithotripsy as outpatient.  I have told them the procedures, limitation, complication, no guarantees about the results.  I told them if the stone comes out, the pain should go away.  If pain continues even or if I cannot get the stone out, then we can discuss other options once ESL is done.  He understands and want me to go ahead and proceed.  He is coming as an outpatient today, and I will have lithotripsy done as outpatient.  PAST MEDICAL HISTORY:  Negative.  FAMILY HISTORY:  Negative.  ALLERGIES:   None.  MEDICATIONS:  None.  PERSONAL HISTORY:  Negative.  REVIEW OF SYSTEMS:  Unremarkable.  PHYSICAL EXAMINATION:  VITAL SIGNS:  Blood pressure is 130/80, temperature is normal. CENTRAL NERVOUS SYSTEM:  No gross neurological deficit. HEAD, NECK, EYES, ENT:  Negative. CHEST:  Symmetrical.  Normal breath sounds. HEART:  Regular sinus rhythm. ABDOMEN:  Soft, flat.  Liver, spleen, kidneys are not palpable.  No CVA tenderness. GU:  External genitalia is negative. RECTAL:  Deferred. EXTREMITIES:  Normal.  IMPRESSION:  Left renal calculus.  PLAN:  ESL, left renal calculus as outpatient.     Ky Barban, M.D.     MIJ/MEDQ  D:  02/03/2013  T:  02/03/2013  Job:  [id number]

## 2013-02-04 NOTE — Op Note (Signed)
NAME:  Stephen Stephenson, Stephen Stephenson NO.:  [id number]  MEDICAL RECORD NO.:  [id number]  LOCATION:  [facility]                          FACILITY:  [facility]  PHYSICIAN:  Ky Barban, M.D.DATE OF BIRTH:  08/20/1950  DATE OF PROCEDURE: DATE OF DISCHARGE:  02/03/2013                              OPERATIVE REPORT   PREOPERATIVE DIAGNOSIS:  Large stone, left ureteropelvic junction causing obstruction.  POSTOPERATIVE DIAGNOSIS:  Large stone, left ureteropelvic junction causing obstruction.  PROCEDURE:  Cystoscopy, insertion of double-J stent.  ANESTHESIA:  General endotracheal.  INDICATION:  This patient was scheduled to have ESL, but I could not find the stone even giving him the dye.  Dye never came out of that kidney, so my feeling was that the kidney is severely obstructed, and I decided to put a double-J stent, so that we do not lose this kidney, and I have explained this thoroughly to the patient.  I have discussed the alternative treatment with him, and so he is here to have a double-J stent insertion so that we can drain that kidney.  PROCEDURE IN DETAIL:  The patient was given general endotracheal anesthesia, placed in the lithotomy position.  After usual prep and drape, a #25 cystoscope was introduced into the bladder.  Left ureteral orifice was visualized and catheterized with an open-ended catheter, #5, and a Glidewire was introduced through this, and the guidewire went up into the renal pelvis,  upper pole.  Over the glide wire, I inserted the open-end catheter went below the UPJ.  At that point, I removed the Glidewire, injected 5 mL of Hypaque solution.  It outlined the stone in the UPJ.  The Glidewire was reinserted, and the ureteral catheter was removed.  Over the guidewire, I advanced a 5-French, 24-cm double-J stent without string.  Under fluoroscopic control, it was positioned between the renal pelvis above the stone, and the other end was in the bladder.  Nice  loop was obtained in the renal pelvis and the bladder. All the instruments were removed.  The patient left the operating room in satisfactory condition.     Ky Barban, M.D.     MIJ/MEDQ  D:  02/03/2013  T:  02/04/2013  Job:  [id number]

## 2013-02-05 ENCOUNTER — Ambulatory Visit (INDEPENDENT_AMBULATORY_CARE_PROVIDER_SITE_OTHER): Payer: BC Managed Care – PPO | Admitting: Internal Medicine

## 2013-02-05 ENCOUNTER — Ambulatory Visit: Payer: BC Managed Care – PPO | Admitting: Internal Medicine

## 2013-02-05 ENCOUNTER — Encounter: Payer: Self-pay | Admitting: Internal Medicine

## 2013-02-05 VITALS — BP 113/79 | HR 90 | Temp 97.2°F | Ht 72.0 in | Wt 206.0 lb

## 2013-02-05 DIAGNOSIS — K760 Fatty (change of) liver, not elsewhere classified: Secondary | ICD-10-CM

## 2013-02-05 DIAGNOSIS — K7689 Other specified diseases of liver: Secondary | ICD-10-CM

## 2013-02-05 DIAGNOSIS — K219 Gastro-esophageal reflux disease without esophagitis: Secondary | ICD-10-CM

## 2013-02-05 NOTE — Progress Notes (Signed)
Primary Care Physician:  Purvis Kilts, MD Primary Gastroenterologist:  Dr. Gala Romney  Pre-Procedure History & Physical: HPI:  Stephen Stephenson is a 62 y.o. male here for elevated liver enzymes and GERD. Likely has NAFLD. Hep C eradicated previously. Has lost 10 pounds since his last office visit. AST actually up to 61 from 38; ALT now normal at 47 down from 64 all other parameters normal. Recent CT scan demonstrated stable hepatic cysts. He's had problems with kidney stones. Reflux symptoms well controlled on AcipHex 20 mg daily.    Past Medical History  Diagnosis Date  . GERD (gastroesophageal reflux disease)   . Hepatitis C     genotype 1 eradicated  . Hb-AS genotype   . DM (diabetes mellitus)     type II  . IBS (irritable bowel syndrome)   . Insomnia   . Kidney stone     Past Surgical History  Procedure Laterality Date  . Shoulder surgery    . Back surgery    . Colonoscopy  02/05/02    anal canal herrhoids ow normal/normal ileum/suspect pt bled from hemorrhoids presently  . Esophagogastroduodenoscopy  05/17/05    normal esophagus/gastric mucosa appeared normal  . Kidney stone surgery    . Colonoscopy  09/27/2011    OIN:[facility] hemorrhoids. Hepatic flexure polyp-removed TUBULAR ADENOMA, due for surveillance March 2016  . Cystoscopy w/ ureteral stent placement Left 02/03/2013    Procedure: CYSTOSCOPY WITH RETROGRADE PYELOGRAM/URETERAL STENT PLACEMENT;  Surgeon: Marissa Nestle, MD;  Location: [facility];  Service: Urology;  Laterality: Left;  . Extracorporeal shock wave lithotripsy Left 02/03/2013    Procedure: EXTRACORPOREAL SHOCK WAVE LITHOTRIPSY (ESWL) LEFT RENAL CALCULUS;  Surgeon: Marissa Nestle, MD;  Location: [facility];  Service: Urology;  Laterality: Left;    Prior to Admission medications   Medication Sig Start Date End Date Taking? Authorizing Provider  aspirin EC 81 MG tablet Take 81 mg by mouth daily.   Yes Historical Provider, MD  oxyCODONE-acetaminophen (PERCOCET)  5-325 MG per tablet Take 2 tablets by mouth every 4 (four) hours as needed for pain. 01/20/13  Yes Nat Christen, MD  promethazine (PHENERGAN) 25 MG tablet Take 1 tablet (25 mg total) by mouth every 6 (six) hours as needed for nausea. 01/20/13  Yes Nat Christen, MD  RABEprazole (ACIPHEX) 20 MG tablet Take 1 tablet (20 mg total) by mouth daily. 08/14/12  Yes Andria Meuse, NP  sitaGLIPtan-metformin (JANUMET) 50-1000 MG per tablet Take 1 tablet by mouth 2 (two) times daily with a meal.   Yes Historical Provider, MD    Allergies as of 02/05/2013  . (No Known Allergies)    Family History  Problem Relation Age of Onset  . Colon cancer Neg Hx   . Liver disease Father     History   Social History  . Marital Status: Married    Spouse Name: N/A    Number of Children: N/A  . Years of Education: N/A   Occupational History  . Not on file.   Social History Main Topics  . Smoking status: Former Smoker -- 1.00 packs/day for 25 years    Types: Cigarettes  . Smokeless tobacco: Former Systems developer    Quit date: 10/12/1992  . Alcohol Use: Yes     Comment: Occasionally  . Drug Use: No  . Sexually Active: Not on file   Other Topics Concern  . Not on file   Social History Narrative  . No narrative on file  Review of Systems: See HPI, otherwise negative ROS  Physical Exam: BP 113/79  Pulse 90  Temp(Src) 97.2 F (36.2 C) (Oral)  Ht 6' (1.829 m)  Wt 206 lb (93.441 kg)  BMI 27.93 kg/m2 General:   Alert,  Well-developed, well-nourished, pleasant and cooperative in NAD Skin:  Intact without significant lesions or rashes. Eyes:  Sclera clear, no icterus.   Conjunctiva pink. Ears:  Normal auditory acuity. Nose:  No deformity, discharge,  or lesions. Mouth:  No deformity or lesions. Neck:  Supple; no masses or thyromegaly. No significant cervical adenopathy. Lungs:  Clear throughout to auscultation.   No wheezes, crackles, or rhonchi. No acute distress. Heart:  Regular rate and rhythm; no murmurs,  clicks, rubs,  or gallops. Abdomen: Non-distended, normal bowel sounds.  Soft and nontender without appreciable mass or hepatosplenomegaly.  Pulses:  Normal pulses noted. Extremities:  Without clubbing or edema.  Impression/Plan:  62 year old gentleman with a probable NAFLD.  GERD symptoms well controlled on AcipHex. Isolated AST elevation nonspecific but could be a marker for more advanced fibrosis. He may be a good candidate for fibroscan in the near future. He was commended on weight loss. Some diarrhea may be due to metformin. With weight loss he may be able to come off of metformin.  Recommendations:   Loose another 10 pounds between now and the end of the year. Continue AcipHex 20 mg daily. See Dr. Hilma Favors about coming off metformin. Office visit here in 6 months with LFTs.  Regular exercise.

## 2013-02-05 NOTE — Patient Instructions (Addendum)
Continue Aciphex daily  Loose 10 more pounds  Follow up with Dr Phillips Odor about coming off Metformin  Office visit with liver blood work in 6 months

## 2013-02-09 ENCOUNTER — Other Ambulatory Visit: Payer: Self-pay

## 2013-02-09 DIAGNOSIS — Z8619 Personal history of other infectious and parasitic diseases: Secondary | ICD-10-CM

## 2013-04-26 ENCOUNTER — Other Ambulatory Visit: Payer: Self-pay | Admitting: Urology

## 2013-04-26 DIAGNOSIS — N200 Calculus of kidney: Secondary | ICD-10-CM

## 2013-05-10 ENCOUNTER — Ambulatory Visit
Admission: RE | Admit: 2013-05-10 | Discharge: 2013-05-10 | Disposition: A | Discharge status: Home / Self Care | Payer: BC Managed Care – PPO | Source: Ambulatory Visit | Attending: Urology | Admitting: Urology

## 2013-05-10 DIAGNOSIS — N200 Calculus of kidney: Secondary | ICD-10-CM

## 2013-07-05 ENCOUNTER — Other Ambulatory Visit: Payer: Self-pay

## 2013-07-05 DIAGNOSIS — Z8619 Personal history of other infectious and parasitic diseases: Secondary | ICD-10-CM

## 2013-07-13 LAB — HEPATIC FUNCTION PANEL
ALT: 41 U/L (ref 0–53)
Albumin: 4.1 g/dL (ref 3.5–5.2)
Alkaline Phosphatase: 42 U/L (ref 39–117)
Total Bilirubin: 0.9 mg/dL (ref 0.3–1.2)
Total Protein: 6.6 g/dL (ref 6.0–8.3)

## 2013-08-05 ENCOUNTER — Ambulatory Visit: Payer: BC Managed Care – PPO | Admitting: Gastroenterology

## 2013-08-09 ENCOUNTER — Encounter (INDEPENDENT_AMBULATORY_CARE_PROVIDER_SITE_OTHER): Payer: Self-pay

## 2013-08-09 ENCOUNTER — Ambulatory Visit (INDEPENDENT_AMBULATORY_CARE_PROVIDER_SITE_OTHER): Payer: BC Managed Care – PPO | Admitting: Gastroenterology

## 2013-08-09 ENCOUNTER — Encounter: Payer: Self-pay | Admitting: Gastroenterology

## 2013-08-09 VITALS — BP 119/79 | HR 77 | Temp 97.6°F | Ht 69.0 in | Wt 213.6 lb

## 2013-08-09 DIAGNOSIS — K7689 Other specified diseases of liver: Secondary | ICD-10-CM

## 2013-08-09 DIAGNOSIS — K219 Gastro-esophageal reflux disease without esophagitis: Secondary | ICD-10-CM

## 2013-08-09 DIAGNOSIS — K649 Unspecified hemorrhoids: Secondary | ICD-10-CM | POA: Insufficient documentation

## 2013-08-09 DIAGNOSIS — R197 Diarrhea, unspecified: Secondary | ICD-10-CM

## 2013-08-09 DIAGNOSIS — R109 Unspecified abdominal pain: Secondary | ICD-10-CM | POA: Insufficient documentation

## 2013-08-09 DIAGNOSIS — K760 Fatty (change of) liver, not elsewhere classified: Secondary | ICD-10-CM | POA: Insufficient documentation

## 2013-08-09 MED ORDER — LIDOCAINE-HYDROCORTISONE ACE 3-1 % RE KIT: 1.0000 "application " | PACK | Freq: Two times a day (BID) | RECTAL | Status: DC

## 2013-08-09 NOTE — Assessment & Plan Note (Signed)
PP abdominal cramping, fecal urgency, diarrhea for two years since on Janumet. Does not want to eat now due to discomfort. 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 Recently started probiotic. Will check stools, TTG, IgA. Check for ifobt. Continue probiotic. PR in 2 weeks. If no improvement, consider updated colonoscopy versus antispasmotic.

## 2013-08-09 NOTE — Assessment & Plan Note (Signed)
Well-controlled on Aciphex.

## 2013-08-09 NOTE — Assessment & Plan Note (Signed)
LFTs normal. Discussed fatty liver. Needs to try to get weight back down. OV in 6 months.

## 2013-08-09 NOTE — Patient Instructions (Signed)
1. Please collect stool studies and have your lab work done. This is to evaluate for diarrhea. 2. Please return iFOBT back to our office to check for blood in stool. 3. Hemorrhoid cream sent to your pharmacy 4. Call in 2 weeks and let me know how you are doing. We'll decide on further workup at that time. 5. Office visit in 6 months to followup fatty liver.  Instructions for fatty liver: Recommend 1-2# weight loss per week until ideal body weight through exercise & diet. Low fat/cholesterol diet.   Avoid sweets, sodas, fruit juices, sweetened beverages like tea, etc. Gradually increase exercise from 15 min daily up to 1 hr per day 5 days/week. Limit alcohol use.

## 2013-08-09 NOTE — Progress Notes (Signed)
Today's noted reviewed. As a separate issue, would consider elastography in the near future here to determine if he needs cirrhosis care or not (will be available in ultrasound in the coming weeks)

## 2013-08-09 NOTE — Assessment & Plan Note (Signed)
Periumbilical cramping obviously related to bowel function. However, RUQ pain seems for independent. He had noncontrast CT last year for urological reasons. Discussed with patient, if pain does not improved, consider abd u/s.

## 2013-08-09 NOTE — Progress Notes (Signed)
Primary Care Physician: Purvis Kilts, MD  Primary Gastroenterologist:  Garfield Cornea, MD   Chief Complaint  Patient presents with  . Follow-up    HPI: Stephen Stephenson is a 63 y.o. male here for followup. He has a history of elevated liver enzymes and GERD. Likely has NAFLD. Hepatitis C eradicated back in 2004. CT scan without contrast done for urological reason showed stable hepatic cyst prior to last office visit. Weight is up about 7 pounds since last office visit.  Went to Dr. Hilma Favors last week for abdominal pain and diarrhea. Sick and tired of it. Put on probiotic last Saturday. Has been on Janumet for two years. BM varies. Normally has 3-4 but lately 2-3 because eating less. Hurts when eats. On like a liquid diet. 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 Fecal urgency. Usually starts off liquid stool, then goes to watery. Occasional solid. No stool test yet. Dealing with this for couple of years. Some nocturnal abdominal pain. RUQ to periumbilical pain. Always will have BM with periumbilical pain but the RUQ pain seems different. RUQ pain feels like cramp/ache and lasts for couple of minutes and never related to food or BMs. Lots of stomach noise. Periumbilical pain subsides with BM. No melena. Some brbpr the other day.  Heartburn controlled on Aciphex. Rare flare up. No n/v. No dysphagia.   Current Outpatient Prescriptions  Medication Sig Dispense Refill  . aspirin EC 81 MG tablet Take 81 mg by mouth daily.      . potassium citrate (UROCIT-K) 10 MEQ (1080 MG) SR tablet Take 10 mEq by mouth 3 (three) times daily with meals.      . Probiotic Product (PROBIOTIC DAILY PO) Take by mouth daily.      . RABEprazole (ACIPHEX) 20 MG tablet Take 1 tablet (20 mg total) by mouth daily.  31 tablet  11  . sitaGLIPtan-metformin (JANUMET) 50-1000 MG per tablet Take 1 tablet by mouth 2 (two) times daily with a meal.      . [DISCONTINUED] metFORMIN (GLUCOPHAGE) 500 MG  tablet Take 500 mg by mouth 2 (two) times daily with a meal.      . [DISCONTINUED] omeprazole (PRILOSEC) 20 MG capsule Take 1 capsule (20 mg total) by mouth daily. 30 minutes before breakfast.  30 capsule  1   No current facility-administered medications for this visit.    Allergies as of 08/09/2013  . (No Known Allergies)    ROS:  General: Negative for anorexia, weight loss, fever, chills, fatigue, weakness. ENT: Negative for hoarseness, difficulty swallowing , nasal congestion. CV: Negative for chest pain, angina, palpitations, dyspnea on exertion, peripheral edema.  Respiratory: Negative for dyspnea at rest, dyspnea on exertion, cough, sputum, wheezing.  GI: See history of present illness. GU:  Negative for dysuria, hematuria, urinary incontinence, urinary frequency, nocturnal urination.  Endo: Negative for unusual weight change.    Physical Examination:   BP 119/79  Pulse 77  Temp(Src) 97.6 F (36.4 C) (Oral)  Ht 5\' 9"  (1.753 m)  Wt 213 lb 9.6 oz (96.888 kg)  BMI 31.53 kg/m2  General: Well-nourished, well-developed in no acute distress.  Eyes: No icterus. Mouth: Oropharyngeal mucosa moist and pink , no lesions erythema or exudate. Lungs: Clear to auscultation bilaterally.  Heart: Regular rate and rhythm, no murmurs rubs or gallops.  Abdomen: Bowel sounds are normal, nontender, nondistended, no hepatosplenomegaly or masses, no abdominal bruits, no rebound or guarding.  +umbilical hernia easily reducible. Rectus  diastasis. Extremities: Trace lower extremity edema. No clubbing or deformities. Neuro: Alert and oriented x 4   Skin: Warm and dry, no jaundice.   Psych: Alert and cooperative, normal mood and affect.  Labs:  Lab Results  Component Value Date   ALT 41 07/12/2013   AST 31 07/12/2013   ALKPHOS 42 07/12/2013   BILITOT 0.9 07/12/2013    Imaging Studies: No results found.

## 2013-08-09 NOTE — Progress Notes (Signed)
cc'd to pcp 

## 2013-08-13 ENCOUNTER — Ambulatory Visit (INDEPENDENT_AMBULATORY_CARE_PROVIDER_SITE_OTHER): Payer: BC Managed Care – PPO | Admitting: Gastroenterology

## 2013-08-13 DIAGNOSIS — R197 Diarrhea, unspecified: Secondary | ICD-10-CM

## 2013-08-13 DIAGNOSIS — R109 Unspecified abdominal pain: Secondary | ICD-10-CM

## 2013-08-13 LAB — IFOBT (OCCULT BLOOD): IMMUNOLOGICAL FECAL OCCULT BLOOD TEST: NEGATIVE

## 2013-08-13 NOTE — Progress Notes (Signed)
Pt return IFOBT test and it was NEGATIVE. 

## 2013-08-14 LAB — IGA: IgA: 349 mg/dL (ref 68–379)

## 2013-08-16 LAB — TISSUE TRANSGLUTAMINASE, IGA: TISSUE TRANSGLUTAMINASE AB, IGA: 4.5 U/mL (ref <20)

## 2013-08-16 LAB — GIARDIA/CRYPTOSPORIDIUM (EIA)
CRYPTOSPORIDIUM SCREEN (EIA) (SOL): NEGATIVE
GIARDIA SCREEN (EIA): NEGATIVE

## 2013-08-16 LAB — CLOSTRIDIUM DIFFICILE BY PCR: Toxigenic C. Difficile by PCR: NOT DETECTED

## 2013-08-17 ENCOUNTER — Telehealth: Payer: Self-pay | Admitting: Internal Medicine

## 2013-08-17 LAB — STOOL CULTURE

## 2013-08-17 NOTE — Telephone Encounter (Signed)
Pt called this afternoon saying he received another letter about having blood work done, but he had labs done last Friday and was wondering does he need to do more or was letter sent by mistake. Please advise and call [phone]

## 2013-08-17 NOTE — Telephone Encounter (Signed)
Spoke with pt- recall came from 01/2013 ov with R[facility] (repeat LFT's in 6 months). Pt had LFT's done on 07/12/13 and recent blood work done for LSL. Advised him to not do any more blood work at this time.  Dr. Gala Romney- when do you want pt to repeat LFT's?  Magda Paganini- pt wanted me to let you know that he has been taking the probiotic and is feeling better, he is going to call back next week and let you know how he is doing then. He asked about his stool studies,  I advised him that ifobt and stool studies were negative. (he is aware that LSL is out of the office today)

## 2013-08-18 NOTE — Telephone Encounter (Signed)
Sounds good. Await PR next week. If continue to do well, then just continue probiotic. Otherwise we could consider adding antispasmotic vs update TCS.  Please let pt know, Dr. Gala Romney has recommended a study to look at liver to see if any evidence of scaring or fibrosis related to fatty liver or prior HCV.  Test will be available in next few weeks at [facility] (elastography). It is a specialized u/s test. We will call him when available.  Please NIC for elastography at [facility] U/S department for 09/2013.

## 2013-08-18 NOTE — Telephone Encounter (Signed)
Procedure has been nicd

## 2013-08-24 ENCOUNTER — Encounter: Payer: Self-pay | Admitting: Gastroenterology

## 2013-08-24 ENCOUNTER — Telehealth: Payer: Self-pay | Admitting: Internal Medicine

## 2013-08-24 NOTE — Telephone Encounter (Signed)
Letter has been mailed to patient

## 2013-08-24 NOTE — Telephone Encounter (Signed)
Pt is on March Recall to have a elastography at [facility] department in March 2015

## 2013-08-30 NOTE — Telephone Encounter (Signed)
Spoke with pt. He said he was still doing good, no pain and almost no diarrhea. He said it was ok to schedule him for the test once it is available.

## 2013-10-14 ENCOUNTER — Other Ambulatory Visit: Payer: Self-pay | Admitting: Gastroenterology

## 2013-10-14 DIAGNOSIS — K769 Liver disease, unspecified: Secondary | ICD-10-CM

## 2013-10-21 ENCOUNTER — Ambulatory Visit (HOSPITAL_COMMUNITY)
Admission: RE | Admit: 2013-10-21 | Discharge: 2013-10-21 | Disposition: A | Discharge status: Home / Self Care | Payer: BC Managed Care – PPO | Source: Ambulatory Visit | Attending: Gastroenterology | Admitting: Gastroenterology

## 2013-10-21 DIAGNOSIS — K7689 Other specified diseases of liver: Secondary | ICD-10-CM | POA: Insufficient documentation

## 2013-10-21 DIAGNOSIS — B1920 Unspecified viral hepatitis C without hepatic coma: Secondary | ICD-10-CM | POA: Insufficient documentation

## 2013-10-21 DIAGNOSIS — K769 Liver disease, unspecified: Secondary | ICD-10-CM

## 2013-10-28 ENCOUNTER — Encounter: Payer: Self-pay | Admitting: Gastroenterology

## 2013-12-10 ENCOUNTER — Ambulatory Visit (HOSPITAL_COMMUNITY)
Admission: RE | Admit: 2013-12-10 | Discharge: 2013-12-10 | Disposition: A | Discharge status: Home / Self Care | Payer: BC Managed Care – PPO | Source: Ambulatory Visit | Attending: Family Medicine | Admitting: Family Medicine

## 2013-12-10 ENCOUNTER — Other Ambulatory Visit (HOSPITAL_COMMUNITY): Payer: Self-pay | Admitting: Family Medicine

## 2013-12-10 ENCOUNTER — Encounter (INDEPENDENT_AMBULATORY_CARE_PROVIDER_SITE_OTHER): Payer: Self-pay

## 2013-12-10 DIAGNOSIS — R05 Cough: Secondary | ICD-10-CM | POA: Insufficient documentation

## 2013-12-10 DIAGNOSIS — R0989 Other specified symptoms and signs involving the circulatory and respiratory systems: Secondary | ICD-10-CM | POA: Insufficient documentation

## 2013-12-10 DIAGNOSIS — J069 Acute upper respiratory infection, unspecified: Secondary | ICD-10-CM

## 2013-12-10 DIAGNOSIS — R059 Cough, unspecified: Secondary | ICD-10-CM | POA: Insufficient documentation

## 2013-12-10 DIAGNOSIS — R509 Fever, unspecified: Secondary | ICD-10-CM | POA: Insufficient documentation

## 2014-02-09 ENCOUNTER — Encounter: Payer: Self-pay | Admitting: Internal Medicine

## 2014-04-14 ENCOUNTER — Encounter (HOSPITAL_COMMUNITY): Payer: Self-pay | Admitting: Pharmacy Technician

## 2014-04-14 ENCOUNTER — Telehealth: Payer: Self-pay

## 2014-04-14 ENCOUNTER — Other Ambulatory Visit: Payer: Self-pay

## 2014-04-14 ENCOUNTER — Ambulatory Visit (INDEPENDENT_AMBULATORY_CARE_PROVIDER_SITE_OTHER): Payer: BC Managed Care – PPO | Admitting: Internal Medicine

## 2014-04-14 DIAGNOSIS — Z8619 Personal history of other infectious and parasitic diseases: Secondary | ICD-10-CM

## 2014-04-14 DIAGNOSIS — I8500 Esophageal varices without bleeding: Secondary | ICD-10-CM

## 2014-04-14 DIAGNOSIS — K7400 Hepatic fibrosis, unspecified: Secondary | ICD-10-CM

## 2014-04-14 DIAGNOSIS — R197 Diarrhea, unspecified: Secondary | ICD-10-CM

## 2014-04-14 DIAGNOSIS — K219 Gastro-esophageal reflux disease without esophagitis: Secondary | ICD-10-CM

## 2014-04-14 NOTE — Patient Instructions (Addendum)
Schedule EGD to screen for esophageal varices  Hepatitis B surface antibody, Hepatitis A antibody  Begin Levsin SL .125 mg SL before meals and at bedtime as needed for abdominal cramps and diarrhea (Rx 120 with 3 refills)  Liver ultrasound every 6 months - screening

## 2014-04-14 NOTE — Progress Notes (Signed)
Primary Care Physician:  Purvis Kilts, MD Primary Gastroenterologist:  Dr. Gala Romney  Pre-Procedure History & Physical: HPI:  Stephen Stephenson is a 63 y.o. male here for followup of the intermittent postprandial abdominal cramps and diarrhea. Stool studies and celiac screen negative. Continues on January at. Has type 2 diabetes mellitus. Occasionally has a formed bowel movement. History of colonic adenoma;  due for surveillance colonoscopy 2016.Metivir 3;  Has multiple hepatic cysts and intrinsic liver disease on ultrasound possibly fatty infiltration. Last EGD many many years ago. Has not lost any weight. Has intermittent right upper quadrant abdominal pain but none recently.  Status post cholecystectomy. AcipHex daily controls reflux symptoms very well.  Past Medical History  Diagnosis Date  . GERD (gastroesophageal reflux disease)   . Hepatitis C     genotype 1 eradicated  . Hb-AS genotype   . DM (diabetes mellitus)     type II  . IBS (irritable bowel syndrome)   . Insomnia   . Kidney stone   . NASH (nonalcoholic steatohepatitis)     Metavir F3, 10/2013    Past Surgical History  Procedure Laterality Date  . Shoulder surgery    . Back surgery    . Colonoscopy  02/05/02    anal canal herrhoids ow normal/normal ileum/suspect pt bled from hemorrhoids presently  . Esophagogastroduodenoscopy  05/17/05    normal esophagus/gastric mucosa appeared normal  . Kidney stone surgery    . Colonoscopy  09/27/2011    TIW:[id number] hemorrhoids. Hepatic flexure polyp-removed TUBULAR ADENOMA, due for surveillance March 2016  . Cystoscopy w/ ureteral stent placement Left 02/03/2013    Procedure: CYSTOSCOPY WITH RETROGRADE PYELOGRAM/URETERAL STENT PLACEMENT;  Surgeon: Marissa Nestle, MD;  Location: [facility];  Service: Urology;  Laterality: Left;  . Extracorporeal shock wave lithotripsy Left 02/03/2013    Procedure: EXTRACORPOREAL SHOCK WAVE LITHOTRIPSY (ESWL) LEFT RENAL CALCULUS;  Surgeon: Marissa Nestle, MD;  Location: [facility];  Service: Urology;  Laterality: Left;    Prior to Admission medications   Medication Sig Start Date End Date Taking? Authorizing Provider  potassium citrate (UROCIT-K) 10 MEQ (1080 MG) SR tablet Take 10 mEq by mouth 2 (two) times daily.    Yes Historical Provider, MD  RABEprazole (ACIPHEX) 20 MG tablet Take 1 tablet (20 mg total) by mouth daily. 08/14/12  Yes Andria Meuse, NP  sitaGLIPtan-metformin (JANUMET) 50-1000 MG per tablet Take 1 tablet by mouth 2 (two) times daily with a meal.   Yes Historical Provider, MD  aspirin EC 81 MG tablet Take 81 mg by mouth daily.    Historical Provider, MD  lidocaine-hydrocortisone (ANAMANTLE) 3-1 % KIT Place 1 application rectally 2 (two) times daily. 08/09/13   Mahala Menghini, PA-C  Probiotic Product (PROBIOTIC DAILY PO) Take by mouth daily.    Historical Provider, MD    Allergies as of 04/14/2014  . (No Known Allergies)    Family History  Problem Relation Age of Onset  . Colon cancer Neg Hx   . Liver disease Father     History   Social History  . Marital Status: Married    Spouse Name: N/A    Number of Children: N/A  . Years of Education: N/A   Occupational History  . Not on file.   Social History Main Topics  . Smoking status: Former Smoker -- 1.00 packs/day for 25 years    Types: Cigarettes  . Smokeless tobacco: Former Systems developer    Quit date: 10/12/1992  .  Alcohol Use: Yes     Comment: Occasionally  . Drug Use: No  . Sexual Activity: Not on file   Other Topics Concern  . Not on file   Social History Narrative  . No narrative on file    Review of Systems: See HPI, otherwise negative ROS  Physical Exam: There were no vitals taken for this visit. General:   Alert,  Well-developed, well-nourished, pleasant and cooperative in NAD Skin:  Intact without significant lesions or rashes.  Any stigmata of chronic liver disease Eyes:  Sclera clear, no icterus.   Conjunctiva pink. Ears:  Normal auditory  acuity. Nose:  No deformity, discharge,  or lesions. Mouth:  No deformity or lesions. Neck:  Supple; no masses or thyromegaly. No significant cervical adenopathy. Lungs:  Clear throughout to auscultation.   No wheezes, crackles, or rhonchi. No acute distress. Heart:  Regular rate and rhythm; no murmurs, clicks, rubs,  or gallops. Abdomen: Non-distended, normal bowel sounds.  Soft and nontender without appreciable mass or hepatosplenomegaly.  Pulses:  Normal pulses noted. Extremities:  Without clubbing or edema.  Impression:  63 year old gentleman with a distant history of HCV  -  status post eradication with antiviral therapy. Hepatic cyst, metavir  F3 ;diffuse liver parenchymal abnormality on ultrasound. He ought to go ahead and be provided cirrhosis care. Hepatitis A and B. status unknown. Needs a screening EGD for varices. Intermittent postprandial abdominal cramps diarrhea most likely related to irritable bowel syndrome. Medication effect (metformin) and diabetic visceral enteropathy not excluded as contributing factors.   Recommendations: Schedule EGD to screen for esophageal varices  The risks, benefits, limitations, alternatives and imponderables have been reviewed with the patient. Potential for, biopsy, etc. have also been reviewed.  Questions have been answered. All parties agreeable.  Hepatitis B surface antibody, Hepatitis A antibody  Begin Levsin SL .125 mg SL before meals and at bedtime as needed for abdominal cramps and diarrhea (Rx 120 with 3 refills)  Liver ultrasound every 6 months - screening    Notice: This dictation was prepared with Dragon dictation along with smaller phrase technology. Any transcriptional errors that result from this process are unintentional and may not be corrected upon review.

## 2014-04-14 NOTE — Telephone Encounter (Signed)
Per Dr. Gala Romney, called Rx to [facility] line :  Levsin 0.125 SL   One before meals and at bedtime as needed for abdominal cramps and diarrhea   With 3 refills.

## 2014-04-15 ENCOUNTER — Telehealth: Payer: Self-pay | Admitting: Internal Medicine

## 2014-04-15 LAB — HEPATITIS B SURFACE ANTIBODY,QUALITATIVE: HEP B S AB: NEGATIVE

## 2014-04-15 LAB — HEPATITIS A ANTIBODY, TOTAL: Hep A Total Ab: NONREACTIVE

## 2014-04-15 NOTE — Telephone Encounter (Signed)
Pt saw RMR yesterday and was given a prescription for Hyoscyamine to take 4 times a day and was only given 20 at the pharmacy. Pt said that RMR was going to write it for a 120. He uses [facility] in [place] and would like for someone to call him back at [phone] or [phone]

## 2014-04-15 NOTE — Telephone Encounter (Signed)
Forward to DS

## 2014-04-15 NOTE — Telephone Encounter (Signed)
I called [facility] and spoke to [place] and told her I called in #120 not 20. She will have them correct it for pt. I LMOM for pt that we was taking care of it.

## 2014-04-25 ENCOUNTER — Encounter (HOSPITAL_COMMUNITY)
Admission: RE | Disposition: A | Discharge status: Home / Self Care | Payer: Self-pay | Source: Ambulatory Visit | Attending: Internal Medicine

## 2014-04-25 ENCOUNTER — Encounter (HOSPITAL_COMMUNITY): Payer: Self-pay | Admitting: *Deleted

## 2014-04-25 ENCOUNTER — Ambulatory Visit (HOSPITAL_COMMUNITY)
Admission: RE | Admit: 2014-04-25 | Discharge: 2014-04-25 | Disposition: A | Discharge status: Home / Self Care | Payer: BC Managed Care – PPO | Source: Ambulatory Visit | Attending: Internal Medicine | Admitting: Internal Medicine

## 2014-04-25 DIAGNOSIS — K2950 Unspecified chronic gastritis without bleeding: Secondary | ICD-10-CM | POA: Diagnosis not present

## 2014-04-25 DIAGNOSIS — K7581 Nonalcoholic steatohepatitis (NASH): Secondary | ICD-10-CM | POA: Insufficient documentation

## 2014-04-25 DIAGNOSIS — B1920 Unspecified viral hepatitis C without hepatic coma: Secondary | ICD-10-CM | POA: Insufficient documentation

## 2014-04-25 DIAGNOSIS — E119 Type 2 diabetes mellitus without complications: Secondary | ICD-10-CM | POA: Diagnosis not present

## 2014-04-25 DIAGNOSIS — Z7982 Long term (current) use of aspirin: Secondary | ICD-10-CM | POA: Insufficient documentation

## 2014-04-25 DIAGNOSIS — G4700 Insomnia, unspecified: Secondary | ICD-10-CM | POA: Insufficient documentation

## 2014-04-25 DIAGNOSIS — K219 Gastro-esophageal reflux disease without esophagitis: Secondary | ICD-10-CM | POA: Insufficient documentation

## 2014-04-25 DIAGNOSIS — K259 Gastric ulcer, unspecified as acute or chronic, without hemorrhage or perforation: Secondary | ICD-10-CM | POA: Insufficient documentation

## 2014-04-25 DIAGNOSIS — K589 Irritable bowel syndrome without diarrhea: Secondary | ICD-10-CM | POA: Insufficient documentation

## 2014-04-25 DIAGNOSIS — Z79899 Other long term (current) drug therapy: Secondary | ICD-10-CM | POA: Diagnosis not present

## 2014-04-25 DIAGNOSIS — I8500 Esophageal varices without bleeding: Secondary | ICD-10-CM

## 2014-04-25 DIAGNOSIS — Z87891 Personal history of nicotine dependence: Secondary | ICD-10-CM | POA: Diagnosis not present

## 2014-04-25 DIAGNOSIS — Z86010 Personal history of colonic polyps: Secondary | ICD-10-CM | POA: Insufficient documentation

## 2014-04-25 DIAGNOSIS — Z13818 Encounter for screening for other digestive system disorders: Secondary | ICD-10-CM

## 2014-04-25 HISTORY — PX: ESOPHAGOGASTRODUODENOSCOPY: SHX5428

## 2014-04-25 SURGERY — EGD (ESOPHAGOGASTRODUODENOSCOPY)
Anesthesia: Moderate Sedation

## 2014-04-25 MED ORDER — SODIUM CHLORIDE 0.9 % IV SOLN
INTRAVENOUS | Status: DC
  Administered 2014-04-25: 14:00:00 via INTRAVENOUS

## 2014-04-25 MED ORDER — ONDANSETRON HCL 4 MG/2ML IJ SOLN
INTRAMUSCULAR | Status: DC | PRN
  Administered 2014-04-25: 4 mg via INTRAVENOUS

## 2014-04-25 MED ORDER — MEPERIDINE HCL 100 MG/ML IJ SOLN
INTRAMUSCULAR | Status: DC | PRN
  Administered 2014-04-25 (×2): 50 mg via INTRAVENOUS

## 2014-04-25 MED ORDER — LIDOCAINE VISCOUS 2 % MT SOLN
OROMUCOSAL | Status: DC | PRN
  Administered 2014-04-25: 2 via OROMUCOSAL

## 2014-04-25 MED ORDER — MIDAZOLAM HCL 5 MG/5ML IJ SOLN
INTRAMUSCULAR | Status: AC
  Filled 2014-04-25: qty 10

## 2014-04-25 MED ORDER — MEPERIDINE HCL 100 MG/ML IJ SOLN
INTRAMUSCULAR | Status: AC
  Filled 2014-04-25: qty 2

## 2014-04-25 MED ORDER — STERILE WATER FOR IRRIGATION IR SOLN
Status: DC | PRN
  Administered 2014-04-25: 14:00:00

## 2014-04-25 MED ORDER — MIDAZOLAM HCL 5 MG/5ML IJ SOLN
INTRAMUSCULAR | Status: DC | PRN
  Administered 2014-04-25 (×2): 2 mg via INTRAVENOUS

## 2014-04-25 MED ORDER — LIDOCAINE VISCOUS 2 % MT SOLN
OROMUCOSAL | Status: AC
  Filled 2014-04-25: qty 15

## 2014-04-25 MED ORDER — ONDANSETRON HCL 4 MG/2ML IJ SOLN
INTRAMUSCULAR | Status: AC
  Filled 2014-04-25: qty 2

## 2014-04-25 NOTE — Discharge Instructions (Signed)
EGD Discharge instructions Please read the instructions outlined below and refer to this sheet in the next few weeks. These discharge instructions provide you with general information on caring for yourself after you leave the hospital. Your doctor may also give you specific instructions. While your treatment has been planned according to the most current medical practices available, unavoidable complications occasionally occur. If you have any problems or questions after discharge, please call your doctor. ACTIVITY  You may resume your regular activity but move at a slower pace for the next 24 hours.   Take frequent rest periods for the next 24 hours.   Walking will help expel (get rid of) the air and reduce the bloated feeling in your abdomen.   No driving for 24 hours (because of the anesthesia (medicine) used during the test).   You may shower.   Do not sign any important legal documents or operate any machinery for 24 hours (because of the anesthesia used during the test).  NUTRITION  Drink plenty of fluids.   You may resume your normal diet.   Begin with a light meal and progress to your normal diet.   Avoid alcoholic beverages for 24 hours or as instructed by your caregiver.  MEDICATIONS  You may resume your normal medications unless your caregiver tells you otherwise.  WHAT YOU CAN EXPECT TODAY  You may experience abdominal discomfort such as a feeling of fullness or gas pains.  FOLLOW-UP  Your doctor will discuss the results of your test with you.  SEEK IMMEDIATE MEDICAL ATTENTION IF ANY OF THE FOLLOWING OCCUR:  Excessive nausea (feeling sick to your stomach) and/or vomiting.   Severe abdominal pain and distention (swelling).   Trouble swallowing.   Temperature over 101 F (37.8 C).   Rectal bleeding or vomiting of blood.    Office visit in 6 months  Further recommendations to follow pending review of pathology report

## 2014-04-25 NOTE — H&P (View-Only) (Signed)
  Primary Care Physician:  GOLDING, JOHN CABOT, MD Primary Gastroenterologist:  Dr. Omaira Mellen  Pre-Procedure History & Physical: HPI:  Stephen Stephenson is a 63 y.o. male here for followup of the intermittent postprandial abdominal cramps and diarrhea. Stool studies and celiac screen negative. Continues on January at. Has type 2 diabetes mellitus. Occasionally has a formed bowel movement. History of colonic adenoma;  due for surveillance colonoscopy 2016.Metivir 3;  Has multiple hepatic cysts and intrinsic liver disease on ultrasound possibly fatty infiltration. Last EGD many many years ago. Has not lost any weight. Has intermittent right upper quadrant abdominal pain but none recently.  Status post cholecystectomy. AcipHex daily controls reflux symptoms very well.  Past Medical History  Diagnosis Date  . GERD (gastroesophageal reflux disease)   . Hepatitis C     genotype 1 eradicated  . Hb-AS genotype   . DM (diabetes mellitus)     type II  . IBS (irritable bowel syndrome)   . Insomnia   . Kidney stone   . NASH (nonalcoholic steatohepatitis)     Metavir F3, 10/2013    Past Surgical History  Procedure Laterality Date  . Shoulder surgery    . Back surgery    . Colonoscopy  02/05/02    anal canal herrhoids ow normal/normal ileum/suspect pt bled from hemorrhoids presently  . Esophagogastroduodenoscopy  05/17/05    normal esophagus/gastric mucosa appeared normal  . Kidney stone surgery    . Colonoscopy  09/27/2011    RMR:Internal hemorrhoids. Hepatic flexure polyp-removed TUBULAR ADENOMA, due for surveillance March 2016  . Cystoscopy w/ ureteral stent placement Left 02/03/2013    Procedure: CYSTOSCOPY WITH RETROGRADE PYELOGRAM/URETERAL STENT PLACEMENT;  Surgeon: Mohammad I Javaid, MD;  Location: [facility];  Service: Urology;  Laterality: Left;  . Extracorporeal shock wave lithotripsy Left 02/03/2013    Procedure: EXTRACORPOREAL SHOCK WAVE LITHOTRIPSY (ESWL) LEFT RENAL CALCULUS;  Surgeon: Mohammad  I Javaid, MD;  Location: [facility];  Service: Urology;  Laterality: Left;    Prior to Admission medications   Medication Sig Start Date End Date Taking? Authorizing Provider  potassium citrate (UROCIT-K) 10 MEQ (1080 MG) SR tablet Take 10 mEq by mouth 2 (two) times daily.    Yes Historical Provider, MD  RABEprazole (ACIPHEX) 20 MG tablet Take 1 tablet (20 mg total) by mouth daily. 08/14/12  Yes Kandice L Jones, NP  sitaGLIPtan-metformin (JANUMET) 50-1000 MG per tablet Take 1 tablet by mouth 2 (two) times daily with a meal.   Yes Historical Provider, MD  aspirin EC 81 MG tablet Take 81 mg by mouth daily.    Historical Provider, MD  lidocaine-hydrocortisone (ANAMANTLE) 3-1 % KIT Place 1 application rectally 2 (two) times daily. 08/09/13   Leslie S Lewis, PA-C  Probiotic Product (PROBIOTIC DAILY PO) Take by mouth daily.    Historical Provider, MD    Allergies as of 04/14/2014  . (No Known Allergies)    Family History  Problem Relation Age of Onset  . Colon cancer Neg Hx   . Liver disease Father     History   Social History  . Marital Status: Married    Spouse Name: N/A    Number of Children: N/A  . Years of Education: N/A   Occupational History  . Not on file.   Social History Main Topics  . Smoking status: Former Smoker -- 1.00 packs/day for 25 years    Types: Cigarettes  . Smokeless tobacco: Former User    Quit date: 10/12/1992  .   Alcohol Use: Yes     Comment: Occasionally  . Drug Use: No  . Sexual Activity: Not on file   Other Topics Concern  . Not on file   Social History Narrative  . No narrative on file    Review of Systems: See HPI, otherwise negative ROS  Physical Exam: There were no vitals taken for this visit. General:   Alert,  Well-developed, well-nourished, pleasant and cooperative in NAD Skin:  Intact without significant lesions or rashes.  Any stigmata of chronic liver disease Eyes:  Sclera clear, no icterus.   Conjunctiva pink. Ears:  Normal auditory  acuity. Nose:  No deformity, discharge,  or lesions. Mouth:  No deformity or lesions. Neck:  Supple; no masses or thyromegaly. No significant cervical adenopathy. Lungs:  Clear throughout to auscultation.   No wheezes, crackles, or rhonchi. No acute distress. Heart:  Regular rate and rhythm; no murmurs, clicks, rubs,  or gallops. Abdomen: Non-distended, normal bowel sounds.  Soft and nontender without appreciable mass or hepatosplenomegaly.  Pulses:  Normal pulses noted. Extremities:  Without clubbing or edema.  Impression:  63 year old gentleman with a distant history of HCV  -  status post eradication with antiviral therapy. Hepatic cyst, metavir  F3 ;diffuse liver parenchymal abnormality on ultrasound. He ought to go ahead and be provided cirrhosis care. Hepatitis A and B. status unknown. Needs a screening EGD for varices. Intermittent postprandial abdominal cramps diarrhea most likely related to irritable bowel syndrome. Medication effect (metformin) and diabetic visceral enteropathy not excluded as contributing factors.   Recommendations: Schedule EGD to screen for esophageal varices  The risks, benefits, limitations, alternatives and imponderables have been reviewed with the patient. Potential for, biopsy, etc. have also been reviewed.  Questions have been answered. All parties agreeable.  Hepatitis B surface antibody, Hepatitis A antibody  Begin Levsin SL .125 mg SL before meals and at bedtime as needed for abdominal cramps and diarrhea (Rx 120 with 3 refills)  Liver ultrasound every 6 months - screening    Notice: This dictation was prepared with Dragon dictation along with smaller phrase technology. Any transcriptional errors that result from this process are unintentional and may not be corrected upon review.

## 2014-04-25 NOTE — Op Note (Signed)
[facility] [address] [place], [address]   ENDOSCOPY PROCEDURE REPORT  PATIENT: Stephen Stephenson, Stephen Stephenson  MR#: [id number] BIRTHDATE: 10/21/1950 , 37  yrs. old GENDER: male ENDOSCOPIST: R.  Garfield Cornea, MD FACP FACG REFERRED BY:  Sharilyn Sites, M.D. PROCEDURE DATE:  22-May-2014 PROCEDURE:  EGD w/ biopsy INDICATIONS:  screening for esophageal varices. MEDICATIONS: Versed 4 mg IV and Demerol 100 mg IV in divided doses. Xylocaine gel orally Zofran 4 mg IV. ASA CLASS:      Class II  CONSENT: The risks, benefits, limitations, alternatives and imponderables have been discussed.  The potential for biopsy, esophogeal dilation, etc. have also been reviewed.  Questions have been answered.  All parties agreeable.  Please see the history and physical in the medical record for more information.  DESCRIPTION OF PROCEDURE: After the risks benefits and alternatives of the procedure were thoroughly explained, informed consent was obtained.  The EG-[id number] ([id number]) endoscope was introduced through the mouth and advanced to the second portion of the duodenum , limited by Without limitations. The instrument was slowly withdrawn as the mucosa was fully examined.    Esophageal mucosa appeared normal.  No esophageal varices.  Stomach empty.  Somewhat congested appearing gastric mucosa with patchy erythema.  There were some greater curvature erosions present.  No frank ulcer or infiltrating process observed patent pylorus. Normal appearing first and second portion of the duodenum appeared. Biopsies of the abnormal gastric mucosa taken for histologic study.Retroflexed views revealed as previously described.     The scope was then withdrawn from the patient and the procedure completed.  COMPLICATIONS: There were no immediate complications.  ENDOSCOPIC IMPRESSION: No esophageal varices. Abnormal gastric because of uncertain significance-status post gastric  biopsy .[name] RECOMMENDATIONS: Followup on pathology. Office visit in 6 months. Consider repeat screening EGD in about 3 years.  REPEAT EXAM:  eSigned:  R. Garfield Cornea, MD Rosalita Chessman [facility] 05-22-2014 2:48 PM    CC:  CPT CODES: ICD CODES:  The ICD and CPT codes recommended by this software are interpretations from the data that the clinical staff has captured with the software.  The verification of the translation of this report to the ICD and CPT codes and modifiers is the sole responsibility of the health care institution and practicing physician where this report was generated.  [employer] will not be held responsible for the validity of the ICD and CPT codes included on this report.  AMA assumes no liability for data contained or not contained herein. CPT is a Designer, television/film set of the [employer].

## 2014-04-25 NOTE — Interval H&P Note (Signed)
History and Physical Interval Note:  04/25/2014 2:14 PM  Stephen Stephenson  has presented today for surgery, with the diagnosis of esophageal varices  The various methods of treatment have been discussed with the patient and family. After consideration of risks, benefits and other options for treatment, the patient has consented to  Procedure(s) with comments: ESOPHAGOGASTRODUODENOSCOPY (EGD) (N/A) - 230  as a surgical intervention .  The patient's history has been reviewed, patient examined, no change in status, stable for surgery.  I have reviewed the patient's chart and labs.  Questions were answered to the patient's satisfaction.     Manus Rudd  Levsin has been of tremendous help in combating his IBS symptoms. Otherwise, no change. EGD per plan today.  The risks, benefits, limitations, alternatives and imponderables have been reviewed with the patient. Potential for esophageal dilation, biopsy, etc. have also been reviewed.  Questions have been answered. All parties agreeable.

## 2014-04-27 ENCOUNTER — Telehealth: Payer: Self-pay

## 2014-04-27 ENCOUNTER — Encounter (HOSPITAL_COMMUNITY): Payer: Self-pay | Admitting: Internal Medicine

## 2014-04-27 NOTE — Telephone Encounter (Signed)
Pt is calling because his  hurting under his ribs. He had a EGD on 04/25/14. He stated that the pain is worst then before his EGD. He is also feeling sick on his stomach. 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 Please advise

## 2014-04-27 NOTE — Telephone Encounter (Signed)
Not sure why he would have stool ribs from EGD with gastric biopsy (biopsy pending). Could be a coincidence. He was given antinausea medicine 2 days to prevent nausea at the time of the procedure. No specific recommendations aside from a light lunch today. Continue to use Levsin as needed for IBS symptoms. Further recommendations to follow pending review of pathology report

## 2014-04-28 NOTE — Telephone Encounter (Signed)
Talked with patient this morning and he said the nausea ws better. I told him that we are waiting for the biopsy to come back. He just don't understand what could cause the pain and sometimes it is hard to breath. I told him when the biopsy came back we would call him.

## 2014-04-28 NOTE — Telephone Encounter (Signed)
Tried to call with no answer  

## 2014-05-02 ENCOUNTER — Encounter: Payer: Self-pay | Admitting: Internal Medicine

## 2014-05-02 ENCOUNTER — Other Ambulatory Visit: Payer: Self-pay | Admitting: Urology

## 2014-05-02 DIAGNOSIS — N200 Calculus of kidney: Secondary | ICD-10-CM

## 2014-05-06 ENCOUNTER — Ambulatory Visit
Admission: RE | Admit: 2014-05-06 | Discharge: 2014-05-06 | Disposition: A | Discharge status: Home / Self Care | Payer: BC Managed Care – PPO | Source: Ambulatory Visit | Attending: Urology | Admitting: Urology

## 2014-05-06 DIAGNOSIS — N200 Calculus of kidney: Secondary | ICD-10-CM

## 2014-09-14 ENCOUNTER — Telehealth: Payer: Self-pay | Admitting: Internal Medicine

## 2014-09-14 ENCOUNTER — Encounter: Payer: Self-pay | Admitting: Internal Medicine

## 2014-09-14 NOTE — Telephone Encounter (Signed)
Mailed reminder letter

## 2014-09-14 NOTE — Telephone Encounter (Signed)
PATIENT ON RECALL LIST FOR ULTRASOUND

## 2014-10-06 ENCOUNTER — Encounter: Payer: Self-pay | Admitting: Internal Medicine

## 2014-10-18 ENCOUNTER — Telehealth: Payer: Self-pay

## 2014-10-18 NOTE — Telephone Encounter (Signed)
Pt was referred by Dr. Hilma Favors for colonoscopy. His last one was 09/27/2011 by Dr. Gala Romney and he recommended his next in 3 years. HX of adenomatous polyps. Pt received a letter to call and schedule, but he doesn't want to schedule til sometime in June because he cannot miss work for [employer]. He is scheduled OV with Neil Crouch, PA on 12/19/2014 at 9:00 AM.  Letter to PCP.

## 2014-12-05 DIAGNOSIS — N200 Calculus of kidney: Secondary | ICD-10-CM | POA: Diagnosis not present

## 2014-12-19 ENCOUNTER — Ambulatory Visit (INDEPENDENT_AMBULATORY_CARE_PROVIDER_SITE_OTHER): Payer: Medicare Other | Admitting: Gastroenterology

## 2014-12-19 ENCOUNTER — Other Ambulatory Visit: Payer: Self-pay

## 2014-12-19 ENCOUNTER — Encounter: Payer: Self-pay | Admitting: Gastroenterology

## 2014-12-19 VITALS — BP 122/86 | HR 96 | Temp 97.7°F | Ht 72.0 in | Wt 204.0 lb

## 2014-12-19 DIAGNOSIS — Z8619 Personal history of other infectious and parasitic diseases: Secondary | ICD-10-CM

## 2014-12-19 DIAGNOSIS — Z86010 Personal history of colonic polyps: Secondary | ICD-10-CM | POA: Diagnosis not present

## 2014-12-19 DIAGNOSIS — K760 Fatty (change of) liver, not elsewhere classified: Secondary | ICD-10-CM | POA: Diagnosis not present

## 2014-12-19 DIAGNOSIS — R197 Diarrhea, unspecified: Secondary | ICD-10-CM

## 2014-12-19 DIAGNOSIS — K589 Irritable bowel syndrome without diarrhea: Secondary | ICD-10-CM | POA: Diagnosis not present

## 2014-12-19 DIAGNOSIS — Z860101 Personal history of adenomatous and serrated colon polyps: Secondary | ICD-10-CM | POA: Insufficient documentation

## 2014-12-19 MED ORDER — LIDOCAINE-HYDROCORTISONE ACE 3-0.5 % RE CREA: 1.0000 | TOPICAL_CREAM | Freq: Two times a day (BID) | RECTAL | Status: DC

## 2014-12-19 MED ORDER — NA SULFATE-K SULFATE-MG SULF 17.5-3.13-1.6 GM/177ML PO SOLN: 1.0000 | ORAL | Status: DC

## 2014-12-19 NOTE — Assessment & Plan Note (Signed)
Due for surveillance colonoscopy given history of adenomatous colon polyp.  I have discussed the risks, alternatives, benefits with regards to but not limited to the risk of reaction to medication, bleeding, infection, perforation and the patient is agreeable to proceed. Written consent to be obtained.

## 2014-12-19 NOTE — Assessment & Plan Note (Signed)
Ongoing postprandial abdominal cramping, fecal urgency, diarrhea. Extensive workup previously. Reevaluate at time of colonoscopy. Working diagnosis includes IBS and/or medication side effect from metformin. He plans to see his PCP soon. Recommended he discuss possibility of stopping metformin for 4 weeks to see if this made any difference in his bowel issues. He agrees.

## 2014-12-19 NOTE — Assessment & Plan Note (Signed)
Metavir F3. History of hepatitis C, status post eradication. Recently completed hepatitis A and B vaccinations under the direction of PCP, health department, [facility]. Cirrhosis care being provided as recommended by Dr. Gala Romney. EGD surveillance up-to-date. Last liver imaging 1 year ago. Plan for abdominal ultrasound in the near future. We were considering repeating elastography next year and still may plan to do that. Retrieve recent labs from PCP for review. Further recommend patient to follow.

## 2014-12-19 NOTE — Patient Instructions (Addendum)
1. Colonoscopy is scheduled. Please see separate instructions. 2. Prescription for [facility] cream sent to your pharmacy. If it is not covered by your insurance, let me know and we can send a prescription to [facility] for them to make, this is usually affordable. 3. I will review a copy of your recent labs from your family doctor. We will let you know if you need any further labs for liver monitoring. 4. Abdominal ultrasound is scheduled. 5. Discussed the possibility of holding metformin for 4 weeks with your family doctor at upcoming visit. Metformin may be contributing to your bowel issues.

## 2014-12-19 NOTE — Progress Notes (Signed)
Primary Care Physician:  Purvis Kilts, MD  Primary Gastroenterologist:  Garfield Cornea, MD   Chief Complaint  Patient presents with  . Abdominal Pain  . set up TCS    HPI:  Stephen Stephenson is a 64 y.o. male here to schedule surveillance colonoscopy. His last colonoscopy was in March 2013. He had minimal hemorrhoids. Single 1cm pedunculated polyp at the hepatic flexure which was a tubular adenoma. He was advised to have another colonoscopy in 3 years.  Patient was last seen in our office in October 2015. Complain of intermittent postprandial abdominal cramps and diarrhea at the time. Previous workup included stool studies and celiac screen which were negative. He has a history of hepatitis C, status post eradication. Ultrasound with Metavir 3 score last year. Decision made to treat him as a cirrhotic as far as surveillance purposes. Patient subsequently has completed hepatitis A and B vaccinations per his report. He had an EGD to screen for esophageal varices back in October, none present, abnormal gastric mucosa with benign inflammation. Plan for EGD in 3 years.   Still having problems with postprandial stomach growling and fecal urgency followed by loose to watery stool. Sometimes abdominal pain in lower abd and sometimes in the mid abdomen. As postprandial loose stools every time he eats, 4-5 times daily. Sometimes he has severe abdominal pain preceding fecal urgency. Pain resolves after bowel movement. Significant pain at least 4-5 times per week. 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 Denies any melena. Occasional bright red blood per rectum. Feels like it's related to hemorrhoids or irritation. Uses [facility] when necessary. Wonders if bowel issues are related to his metformin. Afraid to eat.  Aciphex prn. Heartburn been well-controlled.    Current Outpatient Prescriptions  Medication Sig Dispense Refill  . metFORMIN (GLUCOPHAGE)  1000 MG tablet Take 1,000 mg by mouth 2 (two) times daily with a meal.    . potassium citrate (UROCIT-K) 10 MEQ (1080 MG) SR tablet Take 10 mEq by mouth 2 (two) times daily.     . RABEprazole (ACIPHEX) 20 MG tablet Take 1 tablet (20 mg total) by mouth daily. 31 tablet 11  .    1   No current facility-administered medications for this visit.    Allergies as of 12/19/2014  . (No Known Allergies)    Past Medical History  Diagnosis Date  . GERD (gastroesophageal reflux disease)   . Hepatitis C     genotype 1 eradicated  . Hb-AS genotype   . DM (diabetes mellitus)     type II  . IBS (irritable bowel syndrome)   . Insomnia   . Kidney stone   . NASH (nonalcoholic steatohepatitis)     Metavir F3, 10/2013    Past Surgical History  Procedure Laterality Date  . Shoulder surgery    . Back surgery    . Colonoscopy  02/05/02    anal canal herrhoids ow normal/normal ileum/suspect pt bled from hemorrhoids presently  . Esophagogastroduodenoscopy  05/17/05    normal esophagus/gastric mucosa appeared normal  . Kidney stone surgery    . Colonoscopy  09/27/2011    OIZ:[id number] hemorrhoids. Hepatic flexure polyp-removed TUBULAR ADENOMA, due for surveillance March 2016  . Cystoscopy w/ ureteral stent placement Left 02/03/2013    Procedure: CYSTOSCOPY WITH RETROGRADE PYELOGRAM/URETERAL STENT PLACEMENT;  Surgeon: Marissa Nestle, MD;  Location: [facility];  Service: Urology;  Laterality: Left;  . Extracorporeal shock  wave lithotripsy Left 02/03/2013    Procedure: EXTRACORPOREAL SHOCK WAVE LITHOTRIPSY (ESWL) LEFT RENAL CALCULUS;  Surgeon: Marissa Nestle, MD;  Location: [facility];  Service: Urology;  Laterality: Left;  . Cholecystectomy    . Esophagogastroduodenoscopy N/A 04/25/2014    Abnormal gastric mucosa, benign biopsies. No esophageal varices. Next EGD October 2018.    Family History  Problem Relation Age of Onset  . Colon cancer Neg Hx   . Liver disease Father     History   Social History   . Marital Status: Married    Spouse Name: N/A  . Number of Children: N/A  . Years of Education: N/A   Occupational History  . Not on file.   Social History Main Topics  . Smoking status: Former Smoker -- 1.00 packs/day for 25 years    Types: Cigarettes  . Smokeless tobacco: Former Systems developer    Quit date: 10/12/1992  . Alcohol Use: Yes     Comment: Occasionally  . Drug Use: No  . Sexual Activity: Not on file   Other Topics Concern  . Not on file   Social History Narrative      ROS:  General: Negative for anorexia, weight loss, fever, chills, fatigue, weakness. Eyes: Negative for vision changes.  ENT: Negative for hoarseness, difficulty swallowing , nasal congestion. CV: Negative for chest pain, angina, palpitations, dyspnea on exertion, peripheral edema.  Respiratory: Negative for dyspnea at rest, dyspnea on exertion, cough, sputum, wheezing.  GI: See history of present illness. GU:  Negative for dysuria, hematuria, urinary incontinence, urinary frequency, nocturnal urination.  MS: Negative for joint pain, low back pain.  Derm: Negative for rash or itching.  Neuro: Negative for weakness, abnormal sensation, seizure, frequent headaches, memory loss, confusion.  Psych: Negative for anxiety, depression, suicidal ideation, hallucinations.  Endo: Negative for unusual weight change.  Heme: Negative for bruising or bleeding. Allergy: Negative for rash or hives.    Physical Examination:  BP 122/86 mmHg  Pulse 96  Temp(Src) 97.7 F (36.5 C)  Ht 6' (1.829 m)  Wt 204 lb (92.534 kg)  BMI 27.66 kg/m2   General: Well-nourished, well-developed in no acute distress.  Head: Normocephalic, atraumatic.   Eyes: Conjunctiva pink, no icterus. Mouth: Oropharyngeal mucosa moist and pink , no lesions erythema or exudate. Neck: Supple without thyromegaly, masses, or lymphadenopathy.  Lungs: Clear to auscultation bilaterally.  Heart: Regular rate and rhythm, no murmurs rubs or gallops.   Abdomen: Bowel sounds are normal, mild diffuse tenderness, slightly protuberant and somewhat firm. no hepatosplenomegaly or masses, no abdominal bruits or    hernia , no rebound or guarding.   Rectal: Deferred Extremities: No lower extremity edema. No clubbing or deformities.  Neuro: Alert and oriented x 4 , grossly normal neurologically.  Skin: Warm and dry, no rash or jaundice.   Psych: Alert and cooperative, normal mood and affect.   Imaging Studies: No results found.

## 2014-12-20 NOTE — Progress Notes (Signed)
CC'ED TO PCP 

## 2014-12-23 ENCOUNTER — Ambulatory Visit (HOSPITAL_COMMUNITY)
Admission: RE | Admit: 2014-12-23 | Discharge: 2014-12-23 | Disposition: A | Discharge status: Home / Self Care | Payer: Medicare Other | Source: Ambulatory Visit | Attending: Gastroenterology | Admitting: Gastroenterology

## 2014-12-23 DIAGNOSIS — N200 Calculus of kidney: Secondary | ICD-10-CM | POA: Diagnosis not present

## 2014-12-23 DIAGNOSIS — K760 Fatty (change of) liver, not elsewhere classified: Secondary | ICD-10-CM | POA: Diagnosis not present

## 2014-12-23 DIAGNOSIS — K7689 Other specified diseases of liver: Secondary | ICD-10-CM | POA: Diagnosis not present

## 2014-12-23 DIAGNOSIS — D734 Cyst of spleen: Secondary | ICD-10-CM | POA: Diagnosis not present

## 2014-12-26 NOTE — Progress Notes (Signed)
Recent labs from Dr. Golding's office dated April 2016 White blood cell count 5800, hemoglobin 15.8, hematocrit 45.5, platelets 137,000, BUN 17, creatinine 1.07, calcium 9.9, total bilirubin 0.8, alkaline phosphatase 38, AST 32, ALT 44, albumen 4.6, hemoglobin A1c 6.5. 

## 2014-12-26 NOTE — Progress Notes (Signed)
Quick Note:  No evidence of hepatoma. 1.4 cm nonobstructing left renal stone. Reviewed recent labs from PCP, very mild thrombocytopenia but otherwise labs look good, specifically LFTs and hemoglobin. Colonoscopy is planned. Return to the office in 6 months. ______

## 2014-12-29 DIAGNOSIS — E1169 Type 2 diabetes mellitus with other specified complication: Secondary | ICD-10-CM | POA: Diagnosis not present

## 2014-12-29 DIAGNOSIS — E663 Overweight: Secondary | ICD-10-CM | POA: Diagnosis not present

## 2014-12-29 DIAGNOSIS — Z6826 Body mass index (BMI) 26.0-26.9, adult: Secondary | ICD-10-CM | POA: Diagnosis not present

## 2015-01-20 ENCOUNTER — Ambulatory Visit (HOSPITAL_COMMUNITY)
Admission: RE | Admit: 2015-01-20 | Discharge: 2015-01-20 | Disposition: A | Discharge status: Home / Self Care | Payer: Medicare Other | Source: Ambulatory Visit | Attending: Internal Medicine | Admitting: Internal Medicine

## 2015-01-20 ENCOUNTER — Encounter (HOSPITAL_COMMUNITY)
Admission: RE | Disposition: A | Discharge status: Home / Self Care | Payer: Self-pay | Source: Ambulatory Visit | Attending: Internal Medicine

## 2015-01-20 ENCOUNTER — Encounter (HOSPITAL_COMMUNITY): Payer: Self-pay | Admitting: *Deleted

## 2015-01-20 DIAGNOSIS — Z87442 Personal history of urinary calculi: Secondary | ICD-10-CM | POA: Insufficient documentation

## 2015-01-20 DIAGNOSIS — D125 Benign neoplasm of sigmoid colon: Secondary | ICD-10-CM | POA: Diagnosis not present

## 2015-01-20 DIAGNOSIS — R197 Diarrhea, unspecified: Secondary | ICD-10-CM

## 2015-01-20 DIAGNOSIS — K760 Fatty (change of) liver, not elsewhere classified: Secondary | ICD-10-CM | POA: Diagnosis not present

## 2015-01-20 DIAGNOSIS — K635 Polyp of colon: Secondary | ICD-10-CM

## 2015-01-20 DIAGNOSIS — Z860100 Personal history of colon polyps, unspecified: Secondary | ICD-10-CM | POA: Insufficient documentation

## 2015-01-20 DIAGNOSIS — K649 Unspecified hemorrhoids: Secondary | ICD-10-CM | POA: Insufficient documentation

## 2015-01-20 DIAGNOSIS — Z8619 Personal history of other infectious and parasitic diseases: Secondary | ICD-10-CM | POA: Insufficient documentation

## 2015-01-20 DIAGNOSIS — I10 Essential (primary) hypertension: Secondary | ICD-10-CM | POA: Diagnosis not present

## 2015-01-20 DIAGNOSIS — K5730 Diverticulosis of large intestine without perforation or abscess without bleeding: Secondary | ICD-10-CM | POA: Diagnosis not present

## 2015-01-20 DIAGNOSIS — K529 Noninfective gastroenteritis and colitis, unspecified: Secondary | ICD-10-CM | POA: Diagnosis not present

## 2015-01-20 HISTORY — PX: COLONOSCOPY: SHX5424

## 2015-01-20 SURGERY — COLONOSCOPY
Anesthesia: Moderate Sedation

## 2015-01-20 MED ORDER — ONDANSETRON HCL 4 MG/2ML IJ SOLN
INTRAMUSCULAR | Status: AC
  Filled 2015-01-20: qty 2

## 2015-01-20 MED ORDER — MIDAZOLAM HCL 5 MG/5ML IJ SOLN
INTRAMUSCULAR | Status: AC
  Filled 2015-01-20: qty 10

## 2015-01-20 MED ORDER — ONDANSETRON HCL 4 MG/2ML IJ SOLN
INTRAMUSCULAR | Status: DC | PRN
  Administered 2015-01-20: 4 mg via INTRAVENOUS

## 2015-01-20 MED ORDER — STERILE WATER FOR IRRIGATION IR SOLN
Status: DC | PRN
  Administered 2015-01-20: 10:00:00

## 2015-01-20 MED ORDER — MEPERIDINE HCL 100 MG/ML IJ SOLN
INTRAMUSCULAR | Status: DC | PRN
  Administered 2015-01-20 (×2): 50 mg via INTRAVENOUS

## 2015-01-20 MED ORDER — MEPERIDINE HCL 100 MG/ML IJ SOLN
INTRAMUSCULAR | Status: AC
  Filled 2015-01-20: qty 2

## 2015-01-20 MED ORDER — MIDAZOLAM HCL 5 MG/5ML IJ SOLN
INTRAMUSCULAR | Status: DC | PRN
  Administered 2015-01-20: 2 mg via INTRAVENOUS
  Administered 2015-01-20: 1 mg via INTRAVENOUS
  Administered 2015-01-20: 2 mg via INTRAVENOUS

## 2015-01-20 MED ORDER — SODIUM CHLORIDE 0.9 % IV SOLN
INTRAVENOUS | Status: DC
  Administered 2015-01-20: 09:00:00 via INTRAVENOUS

## 2015-01-20 NOTE — Interval H&P Note (Signed)
History and Physical Interval Note:  01/20/2015 10:20 AM  Stephen Stephenson  has presented today for surgery, with the diagnosis of COLON POLYPS/DIARRHEA  The various methods of treatment have been discussed with the patient and family. After consideration of risks, benefits and other options for treatment, the patient has consented to  Procedure(s) with comments: COLONOSCOPY (N/A) - 1015 - pt has split prep as a surgical intervention .  The patient's history has been reviewed, patient examined, no change in status, stable for surgery.  I have reviewed the patient's chart and labs.  Questions were answered to the patient's satisfaction.     Stephen Stephenson  Diarrhea resolved with cessation of metformin.  Patient here for surveillance colonoscopy. No change otherwise.  The risks, benefits, limitations, alternatives and imponderables have been reviewed with the patient. Questions have been answered. All parties are agreeable.

## 2015-01-20 NOTE — Discharge Instructions (Signed)
°Colonoscopy °Discharge Instructions ° °Read the instructions outlined below and refer to this sheet in the next few weeks. These discharge instructions provide you with general information on caring for yourself after you leave the hospital. Your doctor may also give you specific instructions. While your treatment has been planned according to the most current medical practices available, unavoidable complications occasionally occur. If you have any problems or questions after discharge, call Dr. Rourk at [phone]. °ACTIVITY °· You may resume your regular activity, but move at a slower pace for the next 24 hours.  °· Take frequent rest periods for the next 24 hours.  °· Walking will help get rid of the air and reduce the bloated feeling in your belly (abdomen).  °· No driving for 24 hours (because of the medicine (anesthesia) used during the test).   °· Do not sign any important legal documents or operate any machinery for 24 hours (because of the anesthesia used during the test).  °NUTRITION °· Drink plenty of fluids.  °· You may resume your normal diet as instructed by your doctor.  °· Begin with a light meal and progress to your normal diet. Heavy or fried foods are harder to digest and may make you feel sick to your stomach (nauseated).  °· Avoid alcoholic beverages for 24 hours or as instructed.  °MEDICATIONS °· You may resume your normal medications unless your doctor tells you otherwise.  °WHAT YOU CAN EXPECT TODAY °· Some feelings of bloating in the abdomen.  °· Passage of more gas than usual.  °· Spotting of blood in your stool or on the toilet paper.  °IF YOU HAD POLYPS REMOVED DURING THE COLONOSCOPY: °· No aspirin products for 7 days or as instructed.  °· No alcohol for 7 days or as instructed.  °· Eat a soft diet for the next 24 hours.  °FINDING OUT THE RESULTS OF YOUR TEST °Not all test results are available during your visit. If your test results are not back during the visit, make an appointment  with your caregiver to find out the results. Do not assume everything is normal if you have not heard from your caregiver or the medical facility. It is important for you to follow up on all of your test results.  °SEEK IMMEDIATE MEDICAL ATTENTION IF: °· You have more than a spotting of blood in your stool.  °· Your belly is swollen (abdominal distention).  °· You are nauseated or vomiting.  °· You have a temperature over 101.  °· You have abdominal pain or discomfort that is severe or gets worse throughout the day.  ° °Polyp and diverticulosis information provided ° °Further recommendations to follow pending review of pathology report ° °Colon Polyps °Polyps are lumps of extra tissue growing inside the body. Polyps can grow in the large intestine (colon). Most colon polyps are noncancerous (benign). However, some colon polyps can become cancerous over time. Polyps that are larger than a pea may be harmful. To be safe, caregivers remove and test all polyps. °CAUSES  °Polyps form when mutations in the genes cause your cells to grow and divide even though no more tissue is needed. °RISK FACTORS °There are a number of risk factors that can increase your chances of getting colon polyps. They include: °· Being older than 50 years. °· Family history of colon polyps or colon cancer. °· Long-term colon diseases, such as colitis or Crohn disease. °· Being overweight. °· Smoking. °· Being inactive. °· Drinking too much alcohol. °  SYMPTOMS  °Most small polyps do not cause symptoms. If symptoms are present, they may include: °· Blood in the stool. The stool may look dark red or black. °· Constipation or diarrhea that lasts longer than 1 week. °DIAGNOSIS °People often do not know they have polyps until their caregiver finds them during a regular checkup. Your caregiver can use 4 tests to check for polyps: °· Digital rectal exam. The caregiver wears gloves and feels inside the rectum. This test would find polyps only in the  rectum. °· Barium enema. The caregiver puts a liquid called barium into your rectum before taking X-rays of your colon. Barium makes your colon look white. Polyps are dark, so they are easy to see in the X-ray pictures. °· Sigmoidoscopy. A thin, flexible tube (sigmoidoscope) is placed into your rectum. The sigmoidoscope has a light and tiny camera in it. The caregiver uses the sigmoidoscope to look at the last third of your colon. °· Colonoscopy. This test is like sigmoidoscopy, but the caregiver looks at the entire colon. This is the most common method for finding and removing polyps. °TREATMENT  °Any polyps will be removed during a sigmoidoscopy or colonoscopy. The polyps are then tested for cancer. °PREVENTION  °To help lower your risk of getting more colon polyps: °· Eat plenty of fruits and vegetables. Avoid eating fatty foods. °· Do not smoke. °· Avoid drinking alcohol. °· Exercise every day. °· Lose weight if recommended by your caregiver. °· Eat plenty of calcium and folate. Foods that are rich in calcium include milk, cheese, and broccoli. Foods that are rich in folate include chickpeas, kidney beans, and spinach. °HOME CARE INSTRUCTIONS °Keep all follow-up appointments as directed by your caregiver. You may need periodic exams to check for polyps. °SEEK MEDICAL CARE IF: °You notice bleeding during a bowel movement. °Document Released: 03/27/2004 Document Revised: 09/23/2011 Document Reviewed: 09/10/2011 °ExitCare® Patient Information ©2015 [employer]. This information is not intended to replace advice given to you by your health care provider. Make sure you discuss any questions you have with your health care provider. ° °Diverticulosis °Diverticulosis is the condition that develops when small pouches (diverticula) form in the wall of your colon. Your colon, or large intestine, is where water is absorbed and stool is formed. The pouches form when the inside layer of your colon pushes through weak spots  in the outer layers of your colon. °CAUSES  °No one knows exactly what causes diverticulosis. °RISK FACTORS °· Being older than 50. Your risk for this condition increases with age. Diverticulosis is rare in people younger than 40 years. By age 80, almost everyone has it. °· Eating a low-fiber diet. °· Being frequently constipated. °· Being overweight. °· Not getting enough exercise. °· Smoking. °· Taking over-the-counter pain medicines, like aspirin and ibuprofen. °SYMPTOMS  °Most people with diverticulosis do not have symptoms. °DIAGNOSIS  °Because diverticulosis often has no symptoms, health care providers often discover the condition during an exam for other colon problems. In many cases, a health care provider will diagnose diverticulosis while using a flexible scope to examine the colon (colonoscopy). °TREATMENT  °If you have never developed an infection related to diverticulosis, you may not need treatment. If you have had an infection before, treatment may include: °· Eating more fruits, vegetables, and grains. °· Taking a fiber supplement. °· Taking a live bacteria supplement (probiotic). °· Taking medicine to relax your colon. °HOME CARE INSTRUCTIONS  °· Drink at least 6-8 glasses of water each day   to prevent constipation. °· Try not to strain when you have a bowel movement. °· Keep all follow-up appointments. °If you have had an infection before:  °· Increase the fiber in your diet as directed by your health care provider or dietitian. °· Take a dietary fiber supplement if your health care provider approves. °· Only take medicines as directed by your health care provider. °SEEK MEDICAL CARE IF:  °· You have abdominal pain. °· You have bloating. °· You have cramps. °· You have not gone to the bathroom in 3 days. °SEEK IMMEDIATE MEDICAL CARE IF:  °· Your pain gets worse. °· Your bloating becomes very bad. °· You have a fever or chills, and your symptoms suddenly get worse. °· You begin vomiting. °· You have  bowel movements that are bloody or black. °MAKE SURE YOU: °· Understand these instructions. °· Will watch your condition. °· Will get help right away if you are not doing well or get worse. °Document Released: 03/28/2004 Document Revised: 07/06/2013 Document Reviewed: 05/26/2013 °ExitCare® Patient Information ©2015 [employer]. This information is not intended to replace advice given to you by your health care provider. Make sure you discuss any questions you have with your health care provider. ° ° °

## 2015-01-20 NOTE — Op Note (Signed)
[facility] [address] [place], [address]   COLONOSCOPY PROCEDURE REPORT  PATIENT: Stephen Stephenson, Stephen Stephenson  MR#: [id number] BIRTHDATE: 1951-03-29 , 29  yrs. old GENDER: male ENDOSCOPIST: R.  Garfield Cornea, MD FACP [facility] REFERRED KL:KJZP Hilma Favors, M.D.  Corliss Parish, M.D. PROCEDURE DATE:  February 09, 2015 PROCEDURE:   Ileo-colonoscopy with snare polypectomy INDICATIONS:history of colonic adenoma; chronic diarrhea resolved with cessation of metformin. MEDICATIONS: Versed 5 mg IV and Demerol 100 mg IV in divided doses. Zofran 4 mg IV. ASA CLASS:       Class II  CONSENT: The risks, benefits, alternatives and imponderables including but not limited to bleeding, perforation as well as the possibility of a missed lesion have been reviewed.  The potential for biopsy, lesion removal, etc. have also been discussed. Questions have been answered.  All parties agreeable.  Please see the history and physical in the medical record for more information.  DESCRIPTION OF PROCEDURE:   After the risks benefits and alternatives of the procedure were thoroughly explained, informed consent was obtained.  The digital rectal exam      The [id number] ([id number])  endoscope was introduced through the anus and advanced to the terminal ileum which was intubated for a short distance. No adverse events experienced.   The quality of the prep was adequate The instrument was then slowly withdrawn as the colon was fully examined. Estimated blood loss is zero unless otherwise noted in this procedure report.      COLON FINDINGS: Normal-appearing rectal mucosa.  Scattered left-sided diverticula; (1) 5 mm polyp in the mid sigmoid segment; otherwise, the remainder of the colonic mucosa appeared normal. The distal 5 cm of terminal ileal mucosa also appeared normal. Retroflexion was performed. .  Withdrawal time=14 minutes 0 seconds.  The scope was withdrawn and the procedure completed. COMPLICATIONS:  There were no immediate complications.  ENDOSCOPIC IMPRESSION: Single colonic polyp?"removed as described above.  Colonic diverticulosis.  RECOMMENDATIONS: Follow up on pathology.  Further recommendations to follow.  eSigned:  R. Garfield Cornea, MD Rosalita Chessman [facility] 02/09/15 11:11 AM   cc:  CPT CODES: ICD CODES:  The ICD and CPT codes recommended by this software are interpretations from the data that the clinical staff has captured with the software.  The verification of the translation of this report to the ICD and CPT codes and modifiers is the sole responsibility of the health care institution and practicing physician where this report was generated.  [employer] will not be held responsible for the validity of the ICD and CPT codes included on this report.  AMA assumes no liability for data contained or not contained herein. CPT is a Designer, television/film set of the [employer].  PATIENT NAME:  Antionne, Enrique MR#: [id number]

## 2015-01-20 NOTE — H&P (View-Only) (Signed)
Recent labs from Dr. Delanna Ahmadi office dated April 2016 White blood cell count 5800, hemoglobin 15.8, hematocrit 45.5, platelets 137,000, BUN 17, creatinine 1.07, calcium 9.9, total bilirubin 0.8, alkaline phosphatase 38, AST 32, ALT 44, albumen 4.6, hemoglobin A1c 6.5.

## 2015-01-23 ENCOUNTER — Encounter (HOSPITAL_COMMUNITY): Payer: Self-pay | Admitting: Internal Medicine

## 2015-01-23 ENCOUNTER — Encounter: Payer: Self-pay | Admitting: Internal Medicine

## 2015-02-22 DIAGNOSIS — Z6825 Body mass index (BMI) 25.0-25.9, adult: Secondary | ICD-10-CM | POA: Diagnosis not present

## 2015-02-22 DIAGNOSIS — Z1389 Encounter for screening for other disorder: Secondary | ICD-10-CM | POA: Diagnosis not present

## 2015-02-22 DIAGNOSIS — E663 Overweight: Secondary | ICD-10-CM | POA: Diagnosis not present

## 2015-02-22 DIAGNOSIS — I10 Essential (primary) hypertension: Secondary | ICD-10-CM | POA: Diagnosis not present

## 2015-02-22 DIAGNOSIS — E782 Mixed hyperlipidemia: Secondary | ICD-10-CM | POA: Diagnosis not present

## 2015-02-22 DIAGNOSIS — E119 Type 2 diabetes mellitus without complications: Secondary | ICD-10-CM | POA: Diagnosis not present

## 2015-05-10 DIAGNOSIS — Z23 Encounter for immunization: Secondary | ICD-10-CM | POA: Diagnosis not present

## 2015-05-30 ENCOUNTER — Encounter: Payer: Self-pay | Admitting: Internal Medicine

## 2015-06-16 DIAGNOSIS — N200 Calculus of kidney: Secondary | ICD-10-CM | POA: Diagnosis not present

## 2015-06-26 DIAGNOSIS — I10 Essential (primary) hypertension: Secondary | ICD-10-CM | POA: Diagnosis not present

## 2015-06-26 DIAGNOSIS — Z6827 Body mass index (BMI) 27.0-27.9, adult: Secondary | ICD-10-CM | POA: Diagnosis not present

## 2015-06-26 DIAGNOSIS — E119 Type 2 diabetes mellitus without complications: Secondary | ICD-10-CM | POA: Diagnosis not present

## 2015-06-26 DIAGNOSIS — E663 Overweight: Secondary | ICD-10-CM | POA: Diagnosis not present

## 2015-06-26 DIAGNOSIS — Z1389 Encounter for screening for other disorder: Secondary | ICD-10-CM | POA: Diagnosis not present

## 2015-07-04 ENCOUNTER — Ambulatory Visit: Payer: Medicare Other | Admitting: Gastroenterology

## 2015-07-24 ENCOUNTER — Ambulatory Visit: Payer: Medicare Other | Admitting: Nurse Practitioner

## 2015-08-02 ENCOUNTER — Ambulatory Visit (INDEPENDENT_AMBULATORY_CARE_PROVIDER_SITE_OTHER): Payer: Medicare HMO | Admitting: Nurse Practitioner

## 2015-08-02 ENCOUNTER — Encounter: Payer: Self-pay | Admitting: Nurse Practitioner

## 2015-08-02 ENCOUNTER — Other Ambulatory Visit: Payer: Self-pay

## 2015-08-02 VITALS — BP 134/79 | HR 81 | Temp 97.0°F | Ht 72.0 in | Wt 210.0 lb

## 2015-08-02 DIAGNOSIS — K760 Fatty (change of) liver, not elsewhere classified: Secondary | ICD-10-CM

## 2015-08-02 DIAGNOSIS — K589 Irritable bowel syndrome without diarrhea: Secondary | ICD-10-CM

## 2015-08-02 LAB — COMPREHENSIVE METABOLIC PANEL
ALT: 22 U/L (ref 9–46)
AST: 19 U/L (ref 10–35)
Albumin: 4.2 g/dL (ref 3.6–5.1)
Alkaline Phosphatase: 56 U/L (ref 40–115)
BUN: 18 mg/dL (ref 7–25)
CHLORIDE: 109 mmol/L (ref 98–110)
CO2: 21 mmol/L (ref 20–31)
CREATININE: 1 mg/dL (ref 0.70–1.25)
Calcium: 9.5 mg/dL (ref 8.6–10.3)
GLUCOSE: 78 mg/dL (ref 65–99)
Potassium: 4.3 mmol/L (ref 3.5–5.3)
SODIUM: 140 mmol/L (ref 135–146)
Total Bilirubin: 0.6 mg/dL (ref 0.2–1.2)
Total Protein: 6.8 g/dL (ref 6.1–8.1)

## 2015-08-02 NOTE — Progress Notes (Signed)
Referring Provider: Sharilyn Sites, MD Primary Care Physician:  Purvis Kilts, MD Primary GI:  Dr. Gala Romney  Chief Complaint  Patient presents with  . Follow-up    doing well    HPI:   65 year old male presents for follow-up on IBS and NAFLD. He was last seen in our office on 12/19/2014. Matavir F3, history of hepatitis C status post eradication with hepatitis A and B vaccinations completed. Ongoing cirrhosis care. EGD surveillance up-to-date. At last visit there is the question to reconsider repeating elastography in 2017. Labs obtained from primary care provider's office 2 months prior to visit showed White blood cell count 5800, hemoglobin 15.8, hematocrit 45.5, platelets 137,000, BUN 17, creatinine 1.07, calcium 9.9, total bilirubin 0.8, alkaline phosphatase 38, AST 32, ALT 44, albumen 4.6, hemoglobin A1c 6.5. [place] ultrasound completed on 12/23/2014 found echogenic liver consistent with fatty infiltration and/or hepatocellular disease, multiple hepatic cysts again noted and unchanged, no solid hepatic lesion. Underwent surveillance colonoscopy on 01/20/2015 which found a single 5 mm polyp in the mid sigmoid segment which was sent to pathology, colonic diverticulosis. Pathology noted scant colonic mucosa but extensive thermal artifact. Recommend 5 year repeat colonoscopy.  Today he states he's feeling well. Saw PCP and changed off Metformin to glimiperide with decrease in a1c to 6.0, blood sugars well controlled, has lost weight. Issues with diarrhea now resolved off Metformin. Denies abdominal pain, N/V, hematochezia, melena, change in bowel habits, yellowing of skin/eyes, darkened urine, excessive bleeding/bruising, episodic confusion. Denies chest pain, dyspnea, dizziness, lightheadedness, syncope, near syncope. Denies any other upper or lower GI symptoms.  Past Medical History  Diagnosis Date  . GERD (gastroesophageal reflux disease)   . Hepatitis C     genotype 1 eradicated  .  Hb-AS genotype ([place])   . DM (diabetes mellitus) ([place])     type II  . IBS (irritable bowel syndrome)   . Insomnia   . Kidney stone   . NASH (nonalcoholic steatohepatitis)     Metavir F3, 10/2013, patient reports completing hepatitis a and B vaccination. 2016    Past Surgical History  Procedure Laterality Date  . Shoulder surgery    . Back surgery    . Colonoscopy  02/05/02    anal canal herrhoids ow normal/normal ileum/suspect pt bled from hemorrhoids presently  . Esophagogastroduodenoscopy  05/17/05    normal esophagus/gastric mucosa appeared normal  . Kidney stone surgery    . Colonoscopy  09/27/2011    [id number] hemorrhoids. Hepatic flexure polyp-removed TUBULAR ADENOMA, due for surveillance March 2016  . Cystoscopy w/ ureteral stent placement Left 02/03/2013    Procedure: CYSTOSCOPY WITH RETROGRADE PYELOGRAM/URETERAL STENT PLACEMENT;  Surgeon: Marissa Nestle, MD;  Location: [facility];  Service: Urology;  Laterality: Left;  . Extracorporeal shock wave lithotripsy Left 02/03/2013    Procedure: EXTRACORPOREAL SHOCK WAVE LITHOTRIPSY (ESWL) LEFT RENAL CALCULUS;  Surgeon: Marissa Nestle, MD;  Location: [facility];  Service: Urology;  Laterality: Left;  . Cholecystectomy    . Esophagogastroduodenoscopy N/A 04/25/2014    Abnormal gastric mucosa, benign biopsies. No esophageal varices. Next EGD October 2018.  . Colonoscopy N/A 01/20/2015    [id number] colonic polyp removed/colonic diverticulosis    Current Outpatient Prescriptions  Medication Sig Dispense Refill  . glimepiride (AMARYL) 4 MG tablet Take 4 mg by mouth 2 (two) times daily.    . potassium citrate (UROCIT-K) 10 MEQ (1080 MG) SR tablet Take 10 mEq by mouth 2 (two) times daily.     . RABEprazole (  ACIPHEX) 20 MG tablet Take 1 tablet (20 mg total) by mouth daily. (Patient taking differently: Take 20 mg by mouth daily as needed. ) 31 tablet 11  . lidocaine-hydrocortisone (ANAMANTEL HC) 3-0.5 % CREA Place 1 Applicatorful rectally 2  (two) times daily. For 14 days (Patient not taking: Reported on 08/02/2015) 28.3 g 1  . [DISCONTINUED] omeprazole (PRILOSEC) 20 MG capsule Take 1 capsule (20 mg total) by mouth daily. 30 minutes before breakfast. 30 capsule 1   No current facility-administered medications for this visit.    Allergies as of 08/02/2015  . (No Known Allergies)    Family History  Problem Relation Age of Onset  . Colon cancer Neg Hx   . Liver disease Father     Social History   Social History  . Marital Status: Married    Spouse Name: N/A  . Number of Children: N/A  . Years of Education: N/A   Social History Main Topics  . Smoking status: Former Smoker -- 1.00 packs/day for 25 years    Types: Cigarettes  . Smokeless tobacco: Former Systems developer    Quit date: 10/12/1992  . Alcohol Use: Yes     Comment: Occasionally  . Drug Use: No  . Sexual Activity: Not Asked   Other Topics Concern  . None   Social History Narrative    Review of Systems: General: Negative for anorexia, weight loss, fever, chills, fatigue, weakness. ENT: Negative for hoarseness, difficulty swallowing. CV: Negative for chest pain, angina, palpitations, peripheral edema.  Respiratory: Negative for dyspnea at rest, cough, sputum, wheezing.  GI: See history of present illness. Derm: Negative for rash or itching.  Endo: Negative for unusual weight change.  Heme: Negative for bruising or bleeding.   Physical Exam: BP 134/79 mmHg  Pulse 81  Temp(Src) 97 F (36.1 C) (Oral)  Ht 6' (1.829 m)  Wt 210 lb (95.255 kg)  BMI 28.47 kg/m2 General:   Alert and oriented. Pleasant and cooperative. Well-nourished and well-developed.  Head:  Normocephalic and atraumatic. Eyes:  Without icterus, sclera clear and conjunctiva pink.  Ears:  Normal auditory acuity. Cardiovascular:  S1, S2 present without murmurs appreciated. Extremities without clubbing or edema. Respiratory:  Clear to auscultation bilaterally. No wheezes, rales, or rhonchi. No  distress.  Gastrointestinal:  +BS, soft, non-tender and non-distended. No guarding or rebound. No masses appreciated. No noted jaundice. Rectal:  Deferred  Skin:  Intact without significant lesions or rashes. Neurologic:  Alert and oriented x4; grossly normal neurologically. Psych:  Alert and cooperative. Normal mood and affect. Heme/Lymph/Immune: No excessive bruising noted.    08/02/2015 11:36 AM

## 2015-08-03 ENCOUNTER — Telehealth: Payer: Self-pay | Admitting: Internal Medicine

## 2015-08-03 LAB — PROTIME-INR
INR: 0.99 (ref <1.50)
Prothrombin Time: 13.2 seconds (ref 11.6–15.2)

## 2015-08-03 LAB — CBC WITH DIFFERENTIAL/PLATELET
Basophils Absolute: 0 10*3/uL (ref 0.0–0.1)
Basophils Relative: 0 % (ref 0–1)
EOS ABS: 0.2 10*3/uL (ref 0.0–0.7)
Eosinophils Relative: 3 % (ref 0–5)
HCT: 47.1 % (ref 39.0–52.0)
Hemoglobin: 16.7 g/dL (ref 13.0–17.0)
Lymphocytes Relative: 34 % (ref 12–46)
Lymphs Abs: 2.1 10*3/uL (ref 0.7–4.0)
MCH: 31.4 pg (ref 26.0–34.0)
MCHC: 35.5 g/dL (ref 30.0–36.0)
MCV: 88.5 fL (ref 78.0–100.0)
MONO ABS: 0.6 10*3/uL (ref 0.1–1.0)
MONOS PCT: 10 % (ref 3–12)
MPV: 9.8 fL (ref 8.6–12.4)
NEUTROS PCT: 53 % (ref 43–77)
Neutro Abs: 3.3 10*3/uL (ref 1.7–7.7)
PLATELETS: 132 10*3/uL — AB (ref 150–400)
RBC: 5.32 MIL/uL (ref 4.22–5.81)
RDW: 14.2 % (ref 11.5–15.5)
WBC: 6.2 10*3/uL (ref 4.0–10.5)

## 2015-08-03 NOTE — Telephone Encounter (Signed)
Pt called to say that we can cancel his procedure on 1/23 at 0830 because his insurance is wanting too much money to have it done.

## 2015-08-03 NOTE — Telephone Encounter (Signed)
Noted  

## 2015-08-07 ENCOUNTER — Ambulatory Visit (HOSPITAL_COMMUNITY): Payer: Medicare Other

## 2015-08-07 ENCOUNTER — Other Ambulatory Visit (HOSPITAL_COMMUNITY): Payer: Medicare Other

## 2015-08-08 NOTE — Assessment & Plan Note (Signed)
Asian seems to be doing well from a liver standpoint, no overt signs or symptoms of her disease. At this point we'll order routine labs including CBC, CMP, PTT/INR, AFP. We'll also order follow-up ultrasound elastography as previously considered. Return for follow-up in 6 months for routine liver care.

## 2015-08-08 NOTE — Patient Instructions (Signed)
1. Have your labs drawn when you're able to. 2. We will be schedule your ultrasound. 3. Return for follow-up in 6 months for routine care.

## 2015-08-08 NOTE — Telephone Encounter (Signed)
Noted  

## 2015-08-08 NOTE — Assessment & Plan Note (Signed)
Irritable bowel syndrome with diarrhea at last office visit. This seems to be resolved after changing off of metformin. Likely metformin and related diarrhea. Return for follow-up as needed.

## 2015-08-08 NOTE — Progress Notes (Signed)
Calculations based on visit labs:  MELD: 6 Child-Pugh: A 

## 2015-08-08 NOTE — Progress Notes (Signed)
CC'ED TO PCP 

## 2015-10-09 ENCOUNTER — Telehealth: Payer: Self-pay | Admitting: Internal Medicine

## 2015-10-09 NOTE — Telephone Encounter (Signed)
RECALL FOR ULTRASOUND WITH ELASTO

## 2015-10-09 NOTE — Telephone Encounter (Signed)
Mailed letter °

## 2015-12-20 ENCOUNTER — Encounter: Payer: Self-pay | Admitting: Internal Medicine

## 2016-06-26 DIAGNOSIS — E782 Mixed hyperlipidemia: Secondary | ICD-10-CM | POA: Diagnosis not present

## 2016-06-26 DIAGNOSIS — I10 Essential (primary) hypertension: Secondary | ICD-10-CM | POA: Diagnosis not present

## 2016-06-26 DIAGNOSIS — E119 Type 2 diabetes mellitus without complications: Secondary | ICD-10-CM | POA: Diagnosis not present

## 2016-06-26 DIAGNOSIS — D696 Thrombocytopenia, unspecified: Secondary | ICD-10-CM | POA: Diagnosis not present

## 2016-10-14 DIAGNOSIS — Z6827 Body mass index (BMI) 27.0-27.9, adult: Secondary | ICD-10-CM | POA: Diagnosis not present

## 2016-10-14 DIAGNOSIS — K2960 Other gastritis without bleeding: Secondary | ICD-10-CM | POA: Diagnosis not present

## 2016-10-14 DIAGNOSIS — R1031 Right lower quadrant pain: Secondary | ICD-10-CM | POA: Diagnosis not present

## 2016-10-14 DIAGNOSIS — Z1389 Encounter for screening for other disorder: Secondary | ICD-10-CM | POA: Diagnosis not present

## 2016-10-14 DIAGNOSIS — E119 Type 2 diabetes mellitus without complications: Secondary | ICD-10-CM | POA: Diagnosis not present

## 2016-10-14 DIAGNOSIS — E1140 Type 2 diabetes mellitus with diabetic neuropathy, unspecified: Secondary | ICD-10-CM | POA: Diagnosis not present

## 2016-10-22 ENCOUNTER — Other Ambulatory Visit (HOSPITAL_COMMUNITY): Payer: Self-pay | Admitting: Registered Nurse

## 2016-10-22 DIAGNOSIS — R1031 Right lower quadrant pain: Secondary | ICD-10-CM

## 2016-10-31 DIAGNOSIS — H01025 Squamous blepharitis left lower eyelid: Secondary | ICD-10-CM | POA: Diagnosis not present

## 2016-10-31 DIAGNOSIS — H01022 Squamous blepharitis right lower eyelid: Secondary | ICD-10-CM | POA: Diagnosis not present

## 2016-10-31 DIAGNOSIS — H11153 Pinguecula, bilateral: Secondary | ICD-10-CM | POA: Diagnosis not present

## 2016-10-31 DIAGNOSIS — H01021 Squamous blepharitis right upper eyelid: Secondary | ICD-10-CM | POA: Diagnosis not present

## 2016-10-31 DIAGNOSIS — E1140 Type 2 diabetes mellitus with diabetic neuropathy, unspecified: Secondary | ICD-10-CM | POA: Diagnosis not present

## 2016-10-31 DIAGNOSIS — H44413 Flat anterior chamber hypotony of eye, bilateral: Secondary | ICD-10-CM | POA: Diagnosis not present

## 2016-10-31 DIAGNOSIS — E119 Type 2 diabetes mellitus without complications: Secondary | ICD-10-CM | POA: Diagnosis not present

## 2016-10-31 DIAGNOSIS — H01024 Squamous blepharitis left upper eyelid: Secondary | ICD-10-CM | POA: Diagnosis not present

## 2016-12-20 DIAGNOSIS — N200 Calculus of kidney: Secondary | ICD-10-CM | POA: Diagnosis not present

## 2016-12-24 DIAGNOSIS — Z1389 Encounter for screening for other disorder: Secondary | ICD-10-CM | POA: Diagnosis not present

## 2016-12-24 DIAGNOSIS — I10 Essential (primary) hypertension: Secondary | ICD-10-CM | POA: Diagnosis not present

## 2016-12-24 DIAGNOSIS — Z6827 Body mass index (BMI) 27.0-27.9, adult: Secondary | ICD-10-CM | POA: Diagnosis not present

## 2016-12-24 DIAGNOSIS — E663 Overweight: Secondary | ICD-10-CM | POA: Diagnosis not present

## 2016-12-24 DIAGNOSIS — E119 Type 2 diabetes mellitus without complications: Secondary | ICD-10-CM | POA: Diagnosis not present

## 2016-12-24 DIAGNOSIS — E559 Vitamin D deficiency, unspecified: Secondary | ICD-10-CM | POA: Diagnosis not present

## 2016-12-24 DIAGNOSIS — D696 Thrombocytopenia, unspecified: Secondary | ICD-10-CM | POA: Diagnosis not present

## 2017-01-07 DIAGNOSIS — Z0000 Encounter for general adult medical examination without abnormal findings: Secondary | ICD-10-CM | POA: Diagnosis not present

## 2017-01-07 DIAGNOSIS — Z6827 Body mass index (BMI) 27.0-27.9, adult: Secondary | ICD-10-CM | POA: Diagnosis not present

## 2017-01-07 DIAGNOSIS — Z1389 Encounter for screening for other disorder: Secondary | ICD-10-CM | POA: Diagnosis not present

## 2017-01-07 DIAGNOSIS — E119 Type 2 diabetes mellitus without complications: Secondary | ICD-10-CM | POA: Diagnosis not present

## 2017-01-07 DIAGNOSIS — E782 Mixed hyperlipidemia: Secondary | ICD-10-CM | POA: Diagnosis not present

## 2017-01-07 DIAGNOSIS — I10 Essential (primary) hypertension: Secondary | ICD-10-CM | POA: Diagnosis not present

## 2017-03-26 ENCOUNTER — Encounter: Payer: Self-pay | Admitting: Internal Medicine

## 2017-06-17 DIAGNOSIS — H524 Presbyopia: Secondary | ICD-10-CM | POA: Diagnosis not present

## 2017-06-30 DIAGNOSIS — I10 Essential (primary) hypertension: Secondary | ICD-10-CM | POA: Diagnosis not present

## 2017-06-30 DIAGNOSIS — E663 Overweight: Secondary | ICD-10-CM | POA: Diagnosis not present

## 2017-06-30 DIAGNOSIS — E782 Mixed hyperlipidemia: Secondary | ICD-10-CM | POA: Diagnosis not present

## 2017-06-30 DIAGNOSIS — Z6827 Body mass index (BMI) 27.0-27.9, adult: Secondary | ICD-10-CM | POA: Diagnosis not present

## 2017-06-30 DIAGNOSIS — E1140 Type 2 diabetes mellitus with diabetic neuropathy, unspecified: Secondary | ICD-10-CM | POA: Diagnosis not present

## 2017-06-30 DIAGNOSIS — D696 Thrombocytopenia, unspecified: Secondary | ICD-10-CM | POA: Diagnosis not present

## 2017-06-30 DIAGNOSIS — E559 Vitamin D deficiency, unspecified: Secondary | ICD-10-CM | POA: Diagnosis not present

## 2017-07-23 DIAGNOSIS — E663 Overweight: Secondary | ICD-10-CM | POA: Diagnosis not present

## 2017-07-23 DIAGNOSIS — Z1389 Encounter for screening for other disorder: Secondary | ICD-10-CM | POA: Diagnosis not present

## 2017-07-23 DIAGNOSIS — L821 Other seborrheic keratosis: Secondary | ICD-10-CM | POA: Diagnosis not present

## 2017-07-23 DIAGNOSIS — L989 Disorder of the skin and subcutaneous tissue, unspecified: Secondary | ICD-10-CM | POA: Diagnosis not present

## 2017-07-23 DIAGNOSIS — Z6827 Body mass index (BMI) 27.0-27.9, adult: Secondary | ICD-10-CM | POA: Diagnosis not present

## 2017-08-29 DIAGNOSIS — N200 Calculus of kidney: Secondary | ICD-10-CM | POA: Diagnosis not present

## 2017-11-18 ENCOUNTER — Encounter: Payer: Self-pay | Admitting: Internal Medicine

## 2017-11-18 ENCOUNTER — Ambulatory Visit (INDEPENDENT_AMBULATORY_CARE_PROVIDER_SITE_OTHER): Payer: PPO | Admitting: Internal Medicine

## 2017-11-18 ENCOUNTER — Telehealth: Payer: Self-pay | Admitting: *Deleted

## 2017-11-18 ENCOUNTER — Other Ambulatory Visit: Payer: Self-pay | Admitting: *Deleted

## 2017-11-18 ENCOUNTER — Encounter: Payer: Self-pay | Admitting: *Deleted

## 2017-11-18 VITALS — BP 132/79 | HR 72 | Temp 97.0°F | Ht 72.0 in | Wt 210.2 lb

## 2017-11-18 DIAGNOSIS — R1319 Other dysphagia: Secondary | ICD-10-CM

## 2017-11-18 DIAGNOSIS — K7469 Other cirrhosis of liver: Secondary | ICD-10-CM | POA: Diagnosis not present

## 2017-11-18 DIAGNOSIS — K219 Gastro-esophageal reflux disease without esophagitis: Secondary | ICD-10-CM | POA: Diagnosis not present

## 2017-11-18 DIAGNOSIS — R1310 Dysphagia, unspecified: Secondary | ICD-10-CM

## 2017-11-18 NOTE — Patient Instructions (Signed)
Per RMR: needs procedure to be early AM. No glimepiride the night before procedure.

## 2017-11-18 NOTE — Patient Instructions (Addendum)
Schedule an EGD with possible esophageal dilation - propofol  We will retrieve records regarding prior ultrasound exams  Further recommendations to follow

## 2017-11-18 NOTE — Progress Notes (Signed)
Primary Care Physician:  Sharilyn Sites, MD Primary Gastroenterologist:  Dr. Gala Romney  Pre-Procedure History & Physical: HPI:  Stephen Stephenson is a 67 y.o. male here for follow-up of NAFLD/ cirrhosis.; Due for  screening EGD. No varices on prior EGD. He is doing well denies reflux symptoms. He does note vague intermittent esophageal dysphagia solids and pills recently. He's been vaccinated against hepatitis A and B. History of colonic adenoma-due for surveillance: 2021. History of Metavir 3 on ultrasonography previously. He is actually overdue for screening ultrasound although he says when he has his renal ultrasounds done they do look at his liver. Those results are not available to me at this time. He also is due for lab work.  Past Medical History:  Diagnosis Date  . DM (diabetes mellitus) ([place])    type II  . GERD (gastroesophageal reflux disease)   . Hb-AS genotype ([place])   . Hepatitis C    genotype 1 eradicated  . IBS (irritable bowel syndrome)   . Insomnia   . Kidney stone   . NASH (nonalcoholic steatohepatitis)    Metavir F3, 10/2013, patient reports completing hepatitis a and B vaccination. 2016    Past Surgical History:  Procedure Laterality Date  . BACK SURGERY    . CHOLECYSTECTOMY    . COLONOSCOPY  02/05/02   anal canal herrhoids ow normal/normal ileum/suspect pt bled from hemorrhoids presently  . COLONOSCOPY  09/27/2011   [id number]:[id number] hemorrhoids. Hepatic flexure polyp-removed TUBULAR ADENOMA, due for surveillance March 2016  . COLONOSCOPY N/A 01/20/2015   [id number] colonic polyp removed/colonic diverticulosis  . CYSTOSCOPY W/ URETERAL STENT PLACEMENT Left 02/03/2013   Procedure: CYSTOSCOPY WITH RETROGRADE PYELOGRAM/URETERAL STENT PLACEMENT;  Surgeon: Marissa Nestle, MD;  Location: [facility];  Service: Urology;  Laterality: Left;  . ESOPHAGOGASTRODUODENOSCOPY  05/17/05   normal esophagus/gastric mucosa appeared normal  . ESOPHAGOGASTRODUODENOSCOPY N/A 04/25/2014   Abnormal  gastric mucosa, benign biopsies. No esophageal varices. Next EGD October 2018.  [name] EXTRACORPOREAL SHOCK WAVE LITHOTRIPSY Left 02/03/2013   Procedure: EXTRACORPOREAL SHOCK WAVE LITHOTRIPSY (ESWL) LEFT RENAL CALCULUS;  Surgeon: Marissa Nestle, MD;  Location: [facility];  Service: Urology;  Laterality: Left;  . KIDNEY STONE SURGERY    . SHOULDER SURGERY      Prior to Admission medications   Medication Sig Start Date End Date Taking? Authorizing Provider  glimepiride (AMARYL) 4 MG tablet Take 4 mg by mouth 2 (two) times daily.   Yes [provider]  potassium citrate (UROCIT-K) 10 MEQ (1080 MG) SR tablet Take 10 mEq by mouth 2 (two) times daily.    Yes [provider]  RABEprazole (ACIPHEX) 20 MG tablet Take 1 tablet (20 mg total) by mouth daily. Patient not taking: Reported on 11/18/2017 08/14/12   Andria Meuse, NP  omeprazole (PRILOSEC) 20 MG capsule Take 1 capsule (20 mg total) by mouth daily. 30 minutes before breakfast. 06/13/11 09/24/11  [name], NP    Allergies as of 11/18/2017  . (No Known Allergies)    Family History  Problem Relation Age of Onset  . Colon cancer Neg Hx   . Liver disease Father        ? Hepatitis C; Alcoholic    Social History   Socioeconomic History  . Marital status: Married    Spouse name: Not on file  . Number of children: Not on file  . Years of education: Not on file  . Highest education level: Not on file  Occupational History  .  Not on file  Social Needs  . Financial resource strain: Not on file  . Food insecurity:    Worry: Not on file    Inability: Not on file  . Transportation needs:    Medical: Not on file    Non-medical: Not on file  Tobacco Use  . Smoking status: Former Smoker    Packs/day: 1.00    Years: 25.00    Pack years: 25.00    Types: Cigarettes    Last attempt to quit: 08/02/1995    Years since quitting: 22.3  . Smokeless tobacco: Former Systems developer    Quit date: 10/12/1992  Substance and Sexual Activity    . Alcohol use: Yes    Alcohol/week: 0.0 oz    Comment: Occasionally; Average one drink every few months; Summer will occasionally drink a bottle of wine.  . Drug use: No  . Sexual activity: Not on file  Lifestyle  . Physical activity:    Days per week: Not on file    Minutes per session: Not on file  . Stress: Not on file  Relationships  . Social connections:    Talks on phone: Not on file    Gets together: Not on file    Attends religious service: Not on file    Active member of club or organization: Not on file    Attends meetings of clubs or organizations: Not on file    Relationship status: Not on file  . Intimate partner violence:    Fear of current or ex partner: Not on file    Emotionally abused: Not on file    Physically abused: Not on file    Forced sexual activity: Not on file  Other Topics Concern  . Not on file  Social History Narrative  . Not on file    Review of Systems: See HPI, otherwise negative ROS  Physical Exam: BP 132/79   Pulse 72   Temp (!) 97 F (36.1 C) (Oral)   Ht 6' (1.829 m)   Wt 210 lb 3.2 oz (95.3 kg)   BMI 28.51 kg/m  General:   Alert,  pleasant and cooperative in NAD Neck:  Supple; no masses or thyromegaly. No significant cervical adenopathy. Lungs:  Clear throughout to auscultation.   No wheezes, crackles, or rhonchi. No acute distress. Heart:  Regular rate and rhythm; no murmurs, clicks, rubs,  or gallops. Abdomen: Non-distended, normal bowel sounds.  Soft and nontender without appreciable mass or hepatosplenomegaly.  Pulses:  Normal pulses noted. Extremities:  Without clubbing or edema.  Impression: Very pleasant 67 year old gentleman with NAFLD/ cirrhosis. Remains extremely well compensated. He is due for screening EGD. Vague esophageal dysphagia. History of reflux well controlled on omeprazole 20 mg daily historically.He needs up possibly needs an updated ultrasound labs  Recommendations:  I offered the patient had EGD with  possible esophageal dilation in the near future.The risks, benefits, limitations, alternatives and imponderables have been reviewed with the patient. Potential for esophageal dilation, biopsy, etc. have also been reviewed.  Questions have been answered. All parties agreeable.  Will research medical records to determine timing of next ultrasound. He also will need labs in the near future including a chem 12, INR and CBC  Further recommendations to follow.   Notice: This dictation was prepared with Dragon dictation along with smaller phrase technology. Any transcriptional errors that result from this process are unintentional and may not be corrected upon review.

## 2017-11-18 NOTE — Telephone Encounter (Signed)
Pre-op scheduled for 01/01/18 at 9:00am. Letter mailed. LMOVM

## 2017-12-06 ENCOUNTER — Other Ambulatory Visit: Payer: Self-pay

## 2017-12-06 ENCOUNTER — Emergency Department (HOSPITAL_COMMUNITY)
Admission: EM | Admit: 2017-12-06 | Discharge: 2017-12-06 | Disposition: A | Discharge status: Home / Self Care | Payer: PPO | Attending: Emergency Medicine | Admitting: Emergency Medicine

## 2017-12-06 ENCOUNTER — Encounter (HOSPITAL_COMMUNITY): Payer: Self-pay | Admitting: Emergency Medicine

## 2017-12-06 DIAGNOSIS — Z7984 Long term (current) use of oral hypoglycemic drugs: Secondary | ICD-10-CM | POA: Diagnosis not present

## 2017-12-06 DIAGNOSIS — Z79899 Other long term (current) drug therapy: Secondary | ICD-10-CM | POA: Diagnosis not present

## 2017-12-06 DIAGNOSIS — Z87891 Personal history of nicotine dependence: Secondary | ICD-10-CM | POA: Diagnosis not present

## 2017-12-06 DIAGNOSIS — W269XXA Contact with unspecified sharp object(s), initial encounter: Secondary | ICD-10-CM | POA: Insufficient documentation

## 2017-12-06 DIAGNOSIS — S6992XA Unspecified injury of left wrist, hand and finger(s), initial encounter: Secondary | ICD-10-CM | POA: Diagnosis present

## 2017-12-06 DIAGNOSIS — Z23 Encounter for immunization: Secondary | ICD-10-CM | POA: Insufficient documentation

## 2017-12-06 DIAGNOSIS — Y929 Unspecified place or not applicable: Secondary | ICD-10-CM | POA: Insufficient documentation

## 2017-12-06 DIAGNOSIS — Y939 Activity, unspecified: Secondary | ICD-10-CM | POA: Diagnosis not present

## 2017-12-06 DIAGNOSIS — I10 Essential (primary) hypertension: Secondary | ICD-10-CM | POA: Insufficient documentation

## 2017-12-06 DIAGNOSIS — E119 Type 2 diabetes mellitus without complications: Secondary | ICD-10-CM | POA: Insufficient documentation

## 2017-12-06 DIAGNOSIS — S61211A Laceration without foreign body of left index finger without damage to nail, initial encounter: Secondary | ICD-10-CM | POA: Insufficient documentation

## 2017-12-06 DIAGNOSIS — Y999 Unspecified external cause status: Secondary | ICD-10-CM | POA: Diagnosis not present

## 2017-12-06 MED ORDER — LIDOCAINE HCL (PF) 1 % IJ SOLN
5.0000 mL | Freq: Once | INTRAMUSCULAR | Status: AC
  Administered 2017-12-06: 5 mL via INTRADERMAL

## 2017-12-06 MED ORDER — LIDOCAINE HCL (PF) 1 % IJ SOLN
INTRAMUSCULAR | Status: AC
  Administered 2017-12-06: 5 mL via INTRADERMAL
  Filled 2017-12-06: qty 5

## 2017-12-06 MED ORDER — TETANUS-DIPHTH-ACELL PERTUSSIS 5-2.5-18.5 LF-MCG/0.5 IM SUSP
0.5000 mL | Freq: Once | INTRAMUSCULAR | Status: AC
  Administered 2017-12-06: 0.5 mL via INTRAMUSCULAR
  Filled 2017-12-06: qty 0.5

## 2017-12-06 NOTE — ED Provider Notes (Signed)
[facility] EMERGENCY DEPARTMENT Provider Note   CSN: [id number] Arrival date & time: 12/06/17  1842     History   Chief Complaint Chief Complaint  Patient presents with  . Extremity Laceration    HPI Stephen Stephenson is a 67 y.o. male.  HPI   Stephen Stephenson is a 67 y.o. male who presents to the Emergency Department complaining of laceration to the distal left index finger.  He states that he cut his finger on the metal edge of a tractor fender.  Injury occurred at 2 PM today.  He reports excessive bleeding at the time of the injury that he controlled with pressure.  He cleaned the wound with tap water and opted not to come to the emergency room, but states that the wound began bleeding again so he decided to come here for evaluation.  He denies numbness or weakness of the finger, injury to the nail, pain to the proximal finger or anticoagulant use.  Last TD is unknown    Past Medical History:  Diagnosis Date  . DM (diabetes mellitus) ([place])    type II  . GERD (gastroesophageal reflux disease)   . Hb-AS genotype ([place])   . Hepatitis C    genotype 1 eradicated  . IBS (irritable bowel syndrome)   . Insomnia   . Kidney stone   . NASH (nonalcoholic steatohepatitis)    Metavir F3, 10/2013, patient reports completing hepatitis a and B vaccination. 2016    Patient Active Problem List   Diagnosis Date Noted  . History of colonic polyps   . Diverticulosis of colon without hemorrhage   . Hx of adenomatous colonic polyps 12/19/2014  . NAFLD (nonalcoholic fatty liver disease) 08/09/2013  . Hemorrhoid 08/09/2013  . Diarrhea 08/09/2013  . Abdominal pain, unspecified site 08/09/2013  . IBS (irritable bowel syndrome) 10/08/2012  . Kidney stone 03/14/2012  . HTN (hypertension) 03/14/2012  . DM (diabetes mellitus) ([place]) 03/14/2012  . History of hepatitis C 03/14/2012  . GERD (gastroesophageal reflux disease) 05/21/2011    Past Surgical History:  Procedure Laterality Date  . BACK  SURGERY    . CHOLECYSTECTOMY    . COLONOSCOPY  02/05/02   anal canal herrhoids ow normal/normal ileum/suspect pt bled from hemorrhoids presently  . COLONOSCOPY  09/27/2011   [id number] hemorrhoids. Hepatic flexure polyp-removed TUBULAR ADENOMA, due for surveillance March 2016  . COLONOSCOPY N/A 01/20/2015   EHM:[id number] colonic polyp removed/colonic diverticulosis  . CYSTOSCOPY W/ URETERAL STENT PLACEMENT Left 02/03/2013   Procedure: CYSTOSCOPY WITH RETROGRADE PYELOGRAM/URETERAL STENT PLACEMENT;  Surgeon: Marissa Nestle, MD;  Location: [facility];  Service: Urology;  Laterality: Left;  . ESOPHAGOGASTRODUODENOSCOPY  05/17/05   normal esophagus/gastric mucosa appeared normal  . ESOPHAGOGASTRODUODENOSCOPY N/A 04/25/2014   Abnormal gastric mucosa, benign biopsies. No esophageal varices. Next EGD October 2018.  [name] EXTRACORPOREAL SHOCK WAVE LITHOTRIPSY Left 02/03/2013   Procedure: EXTRACORPOREAL SHOCK WAVE LITHOTRIPSY (ESWL) LEFT RENAL CALCULUS;  Surgeon: Marissa Nestle, MD;  Location: [facility];  Service: Urology;  Laterality: Left;  . KIDNEY STONE SURGERY    . SHOULDER SURGERY          Home Medications    Prior to Admission medications   Medication Sig Start Date End Date Taking? Authorizing Provider  glimepiride (AMARYL) 4 MG tablet Take 4 mg by mouth 2 (two) times daily.    [provider]  potassium citrate (UROCIT-K) 10 MEQ (1080 MG) SR tablet Take 10 mEq by mouth 2 (two) times daily.  [provider]  RABEprazole (ACIPHEX) 20 MG tablet Take 1 tablet (20 mg total) by mouth daily. Patient not taking: Reported on 11/18/2017 08/14/12   Andria Meuse, NP  omeprazole (PRILOSEC) 20 MG capsule Take 1 capsule (20 mg total) by mouth daily. 30 minutes before breakfast. 06/13/11 09/24/11  [name], NP    Family History Family History  Problem Relation Age of Onset  . Liver disease Father        ? Hepatitis C; Alcoholic  . Colon cancer Neg Hx     Social History Social  History   Tobacco Use  . Smoking status: Former Smoker    Packs/day: 1.00    Years: 25.00    Pack years: 25.00    Types: Cigarettes    Last attempt to quit: 08/02/1995    Years since quitting: 22.3  . Smokeless tobacco: Former Systems developer    Quit date: 10/12/1992  Substance Use Topics  . Alcohol use: Yes    Alcohol/week: 0.0 oz    Comment: Occasionally; Average one drink every few months; Summer will occasionally drink a bottle of wine.  . Drug use: No     Allergies   Patient has no known allergies.   Review of Systems Review of Systems  Constitutional: Negative for chills and fever.  Musculoskeletal: Negative for arthralgias and back pain.  Skin: Positive for wound. Negative for color change.       Laceration left index finger  Neurological: Negative for weakness and numbness.  Hematological: Does not bruise/bleed easily.  All other systems reviewed and are negative.    Physical Exam Updated Vital Signs BP (!) 137/92 (BP Location: Right Arm)   Pulse 98   Temp 97.8 F (36.6 C) (Oral)   Resp 20   Ht 6' (1.829 m)   Wt 95.3 kg (210 lb)   SpO2 96%   BMI 28.48 kg/m   Physical Exam  Constitutional: He is oriented to person, place, and time. He appears well-developed and well-nourished. No distress.  HENT:  Head: Normocephalic and atraumatic.  Cardiovascular: Normal rate, regular rhythm and intact distal pulses.  No murmur heard. Pulmonary/Chest: Effort normal and breath sounds normal. No respiratory distress.  Musculoskeletal: Normal range of motion. He exhibits no edema or tenderness.       Left hand: He exhibits laceration. He exhibits normal range of motion, no bony tenderness, normal capillary refill and no swelling. Normal sensation noted. Normal strength noted. He exhibits no finger abduction and no thumb/finger opposition.       Hands: 1.5 cm lac at the DIP joint of the left index finger.  Bleeding controlled.  Patient has full range of motion at the DIP and PIP  joints.  normal finger thumb opposition.   No edema  Neurological: He is alert and oriented to person, place, and time. No sensory deficit. He exhibits normal muscle tone. Coordination normal.  Skin: Skin is warm. Laceration noted.  1.5 cm laceration to the distal left index finger.  Bleeding controlled  Nursing note and vitals reviewed.    ED Treatments / Results  Labs (all labs ordered are listed, but only abnormal results are displayed) Labs Reviewed - No data to display  EKG None  Radiology No results found.  Procedures Procedures (including critical care time)  Medications Ordered in ED Medications  Tdap (BOOSTRIX) injection 0.5 mL (has no administration in time range)  lidocaine (PF) (XYLOCAINE) 1 % injection 5 mL (has no administration in time range)  Initial Impression / Assessment and Plan / ED Course  I have reviewed the triage vital signs and the nursing notes.  Pertinent labs & imaging results that were available during my care of the patient were reviewed by me and considered in my medical decision making (see chart for details).    TD updated.  Neurovascularly intact.  No foreign bodies or tendon injuries seen, wound edges well approximated with sutures.  Bleeding controlled prior to closure.  Patient agrees to wound care instructions, sutures out in 8 to 10 days.  Return precautions discussed.   Final Clinical Impressions(s) / ED Diagnoses   Final diagnoses:  Laceration of left index finger without foreign body without damage to nail, initial encounter    ED Discharge Orders    None       Bufford Lope 12/06/17 2002    Nanavati, Ankit, MD 12/07/17 [phone]

## 2017-12-06 NOTE — Discharge Instructions (Addendum)
Keep the wound clean with mild soap and water and keep it bandaged.  Sutures out in 8 to 10 days.  Return to the ER for any signs of infection such as swelling, redness, drainage, or increasing pain.

## 2017-12-06 NOTE — ED Triage Notes (Signed)
Pt c/o laceration to LT index finger. Bleeding controlled with pressure.

## 2017-12-15 DIAGNOSIS — Z6827 Body mass index (BMI) 27.0-27.9, adult: Secondary | ICD-10-CM | POA: Diagnosis not present

## 2017-12-15 DIAGNOSIS — E663 Overweight: Secondary | ICD-10-CM | POA: Diagnosis not present

## 2017-12-15 DIAGNOSIS — Z4802 Encounter for removal of sutures: Secondary | ICD-10-CM | POA: Diagnosis not present

## 2017-12-15 DIAGNOSIS — Z1389 Encounter for screening for other disorder: Secondary | ICD-10-CM | POA: Diagnosis not present

## 2017-12-29 NOTE — Patient Instructions (Signed)
Stephen Stephenson  12/29/2017     @PREFPERIOPPHARMACY @   Your procedure is scheduled on  01/08/2018   Report to [facility] at  615   A.M.  Call this number if you have problems the morning of surgery:  [phone]   Remember:  Do not eat or drink after midnight.  You may drink clear liquids until (follow the instructions given to you) .  Clear liquids allowed are:                    Water, Juice (non-citric and without pulp), Carbonated beverages, Clear Tea, Black Coffee only, Plain Jell-O only, Gatorade and Plain Popsicles only    Take these medicines the morning of surgery with A SIP OF WATER  None    Do not wear jewelry, make-up or nail polish.  Do not wear lotions, powders, or perfumes, or deodorant.  Do not shave 48 hours prior to surgery.  Men may shave face and neck.  Do not bring valuables to the hospital.  [facility] is not responsible for any belongings or valuables.  Contacts, dentures or bridgework may not be worn into surgery.  Leave your suitcase in the car.  After surgery it may be brought to your room.  For patients admitted to the hospital, discharge time will be determined by your treatment team.  Patients discharged the day of surgery will not be allowed to drive home.   Name and phone number of your driver:   family Special instructions:  Follow the diet and prep instructions given to you by Dr Roseanne Kaufman office.  Please read over the following fact sheets that you were given. Anesthesia Post-op Instructions and Care and Recovery After Surgery       Esophagogastroduodenoscopy Esophagogastroduodenoscopy (EGD) is a procedure to examine the lining of the esophagus, stomach, and first part of the small intestine (duodenum). This procedure is done to check for problems such as inflammation, bleeding, ulcers, or growths. During this procedure, a long, flexible, lighted tube with a camera attached (endoscope) is inserted down the throat. Tell a  health care provider about:  Any allergies you have.  All medicines you are taking, including vitamins, herbs, eye drops, creams, and over-the-counter medicines.  Any problems you or family members have had with anesthetic medicines.  Any blood disorders you have.  Any surgeries you have had.  Any medical conditions you have.  Whether you are pregnant or may be pregnant. What are the risks? Generally, this is a safe procedure. However, problems may occur, including:  Infection.  Bleeding.  A tear (perforation) in the esophagus, stomach, or duodenum.  Trouble breathing.  Excessive sweating.  Spasms of the larynx.  A slowed heartbeat.  Low blood pressure.  What happens before the procedure?  Follow instructions from your health care provider about eating or drinking restrictions.  Ask your health care provider about: ? Changing or stopping your regular medicines. This is especially important if you are taking diabetes medicines or blood thinners. ? Taking medicines such as aspirin and ibuprofen. These medicines can thin your blood. Do not take these medicines before your procedure if your health care provider instructs you not to.  Plan to have someone take you home after the procedure.  If you wear dentures, be ready to remove them before the procedure. What happens during the procedure?  To reduce your risk of infection, your health care team will  wash or sanitize their hands.  An IV tube will be put in a vein in your hand or arm. You will get medicines and fluids through this tube.  You will be given one or more of the following: ? A medicine to help you relax (sedative). ? A medicine to numb the area (local anesthetic). This medicine may be sprayed into your throat. It will make you feel more comfortable and keep you from gagging or coughing during the procedure. ? A medicine for pain.  A mouth guard may be placed in your mouth to protect your teeth and to  keep you from biting on the endoscope.  You will be asked to lie on your left side.  The endoscope will be lowered down your throat into your esophagus, stomach, and duodenum.  Air will be put into the endoscope. This will help your health care provider see better.  The lining of your esophagus, stomach, and duodenum will be examined.  Your health care provider may: ? Take a tissue sample so it can be looked at in a lab (biopsy). ? Remove growths. ? Remove objects (foreign bodies) that are stuck. ? Treat any bleeding with medicines or other devices that stop tissue from bleeding. ? Widen (dilate) or stretch narrowed areas of your esophagus and stomach.  The endoscope will be taken out. The procedure may vary among health care providers and hospitals. What happens after the procedure?  Your blood pressure, heart rate, breathing rate, and blood oxygen level will be monitored often until the medicines you were given have worn off.  Do not eat or drink anything until the numbing medicine has worn off and your gag reflex has returned. This information is not intended to replace advice given to you by your health care provider. Make sure you discuss any questions you have with your health care provider. Document Released: 11/01/2004 Document Revised: 12/07/2015 Document Reviewed: 05/25/2015 Elsevier Interactive Patient Education  2018 [employer]. Esophagogastroduodenoscopy, Care After Refer to this sheet in the next few weeks. These instructions provide you with information about caring for yourself after your procedure. Your health care provider may also give you more specific instructions. Your treatment has been planned according to current medical practices, but problems sometimes occur. Call your health care provider if you have any problems or questions after your procedure. What can I expect after the procedure? After the procedure, it is common to have:  A sore  throat.  Nausea.  Bloating.  Dizziness.  Fatigue.  Follow these instructions at home:  Do not eat or drink anything until the numbing medicine (local anesthetic) has worn off and your gag reflex has returned. You will know that the local anesthetic has worn off when you can swallow comfortably.  Do not drive for 24 hours if you received a medicine to help you relax (sedative).  If your health care provider took a tissue sample for testing during the procedure, make sure to get your test results. This is your responsibility. Ask your health care provider or the department performing the test when your results will be ready.  Keep all follow-up visits as told by your health care provider. This is important. Contact a health care provider if:  You cannot stop coughing.  You are not urinating.  You are urinating less than usual. Get help right away if:  You have trouble swallowing.  You cannot eat or drink.  You have throat or chest pain that gets worse.  You are  dizzy or light-headed.  You faint.  You have nausea or vomiting.  You have chills.  You have a fever.  You have severe abdominal pain.  You have black, tarry, or bloody stools. This information is not intended to replace advice given to you by your health care provider. Make sure you discuss any questions you have with your health care provider. Document Released: 06/17/2012 Document Revised: 12/07/2015 Document Reviewed: 05/25/2015 Elsevier Interactive Patient Education  2018 [employer].  Esophageal Dilatation Esophageal dilatation is a procedure to open a blocked or narrowed part of the esophagus. The esophagus is the long tube in your throat that carries food and liquid from your mouth to your stomach. The procedure is also called esophageal dilation. You may need this procedure if you have a buildup of scar tissue in your esophagus that makes it difficult, painful, or even impossible to swallow. This  can be caused by gastroesophageal reflux disease (GERD). In rare cases, people need this procedure because they have cancer of the esophagus or a problem with the way food moves through the esophagus. Sometimes you may need to have another dilatation to enlarge the opening of the esophagus gradually. Tell a health care provider about:  Any allergies you have.  All medicines you are taking, including vitamins, herbs, eye drops, creams, and over-the-counter medicines.  Any problems you or family members have had with anesthetic medicines.  Any blood disorders you have.  Any surgeries you have had.  Any medical conditions you have.  Any antibiotic medicines you are required to take before dental procedures. What are the risks? Generally, this is a safe procedure. However, problems can occur and include:  Bleeding from a tear in the lining of the esophagus.  A hole (perforation) in the esophagus.  What happens before the procedure?  Do not eat or drink anything after midnight on the night before the procedure or as directed by your health care provider.  Ask your health care provider about changing or stopping your regular medicines. This is especially important if you are taking diabetes medicines or blood thinners.  Plan to have someone take you home after the procedure. What happens during the procedure?  You will be given a medicine that makes you relaxed and sleepy (sedative).  A medicine may be sprayed or gargled to numb the back of the throat.  Your health care provider can use various instruments to do an esophageal dilatation. During the procedure, the instrument used will be placed in your mouth and passed down into your esophagus. Options include: ? Simple dilators. This instrument is carefully placed in the esophagus to stretch it. ? Guided wire bougies. In this method, a flexible tube (endoscope) is used to insert a wire into the esophagus. The dilator is passed over  this wire to enlarge the esophagus. Then the wire is removed. ? Balloon dilators. An endoscope with a small balloon at the end is passed down into the esophagus. Inflating the balloon gently stretches the esophagus and opens it up. What happens after the procedure?  Your blood pressure, heart rate, breathing rate, and blood oxygen level will be monitored often until the medicines you were given have worn off.  Your throat may feel slightly sore and will probably still feel numb. This will improve slowly over time.  You will not be allowed to eat or drink until the throat numbness has resolved.  If this is a same-day procedure, you may be allowed to go home once you  have been able to drink, urinate, and sit on the edge of the bed without nausea or dizziness.  If this is a same-day procedure, you should have a friend or family member with you for the next 24 hours after the procedure. This information is not intended to replace advice given to you by your health care provider. Make sure you discuss any questions you have with your health care provider. Document Released: 08/22/2005 Document Revised: 12/07/2015 Document Reviewed: 11/10/2013 Elsevier Interactive Patient Education  2018 [place] Anesthesia is a term that refers to techniques, procedures, and medicines that help a person stay safe and comfortable during a medical procedure. Monitored anesthesia care, or sedation, is one type of anesthesia. Your anesthesia specialist may recommend sedation if you will be having a procedure that does not require you to be unconscious, such as:  Cataract surgery.  A dental procedure.  A biopsy.  A colonoscopy.  During the procedure, you may receive a medicine to help you relax (sedative). There are three levels of sedation:  Mild sedation. At this level, you may feel awake and relaxed. You will be able to follow directions.  Moderate sedation. At this level,  you will be sleepy. You may not remember the procedure.  Deep sedation. At this level, you will be asleep. You will not remember the procedure.  The more medicine you are given, the deeper your level of sedation will be. Depending on how you respond to the procedure, the anesthesia specialist may change your level of sedation or the type of anesthesia to fit your needs. An anesthesia specialist will monitor you closely during the procedure. Let your health care provider know about:  Any allergies you have.  All medicines you are taking, including vitamins, herbs, eye drops, creams, and over-the-counter medicines.  Any use of steroids (by mouth or as a cream).  Any problems you or family members have had with sedatives and anesthetic medicines.  Any blood disorders you have.  Any surgeries you have had.  Any medical conditions you have, such as sleep apnea.  Whether you are pregnant or may be pregnant.  Any use of cigarettes, alcohol, or street drugs. What are the risks? Generally, this is a safe procedure. However, problems may occur, including:  Getting too much medicine (oversedation).  Nausea.  Allergic reaction to medicines.  Trouble breathing. If this happens, a breathing tube may be used to help with breathing. It will be removed when you are awake and breathing on your own.  Heart trouble.  Lung trouble.  Before the procedure Staying hydrated Follow instructions from your health care provider about hydration, which may include:  Up to 2 hours before the procedure - you may continue to drink clear liquids, such as water, clear fruit juice, black coffee, and plain tea.  Eating and drinking restrictions Follow instructions from your health care provider about eating and drinking, which may include:  8 hours before the procedure - stop eating heavy meals or foods such as meat, fried foods, or fatty foods.  6 hours before the procedure - stop eating light meals or  foods, such as toast or cereal.  6 hours before the procedure - stop drinking milk or drinks that contain milk.  2 hours before the procedure - stop drinking clear liquids.  Medicines Ask your health care provider about:  Changing or stopping your regular medicines. This is especially important if you are taking diabetes medicines or blood thinners.  Taking  medicines such as aspirin and ibuprofen. These medicines can thin your blood. Do not take these medicines before your procedure if your health care provider instructs you not to.  Tests and exams  You will have a physical exam.  You may have blood tests done to show: ? How well your kidneys and liver are working. ? How well your blood can clot.  General instructions  Plan to have someone take you home from the hospital or clinic.  If you will be going home right after the procedure, plan to have someone with you for 24 hours.  What happens during the procedure?  Your blood pressure, heart rate, breathing, level of pain and overall condition will be monitored.  An IV tube will be inserted into one of your veins.  Your anesthesia specialist will give you medicines as needed to keep you comfortable during the procedure. This may mean changing the level of sedation.  The procedure will be performed. After the procedure  Your blood pressure, heart rate, breathing rate, and blood oxygen level will be monitored until the medicines you were given have worn off.  Do not drive for 24 hours if you received a sedative.  You may: ? Feel sleepy, clumsy, or nauseous. ? Feel forgetful about what happened after the procedure. ? Have a sore throat if you had a breathing tube during the procedure. ? Vomit. This information is not intended to replace advice given to you by your health care provider. Make sure you discuss any questions you have with your health care provider. Document Released: 03/27/2005 Document Revised: 12/08/2015  Document Reviewed: 10/22/2015 Elsevier Interactive Patient Education  2018 [place], Care After These instructions provide you with information about caring for yourself after your procedure. Your health care provider may also give you more specific instructions. Your treatment has been planned according to current medical practices, but problems sometimes occur. Call your health care provider if you have any problems or questions after your procedure. What can I expect after the procedure? After your procedure, it is common to:  Feel sleepy for several hours.  Feel clumsy and have poor balance for several hours.  Feel forgetful about what happened after the procedure.  Have poor judgment for several hours.  Feel nauseous or vomit.  Have a sore throat if you had a breathing tube during the procedure.  Follow these instructions at home: For at least 24 hours after the procedure:   Do not: ? Participate in activities in which you could fall or become injured. ? Drive. ? Use heavy machinery. ? Drink alcohol. ? Take sleeping pills or medicines that cause drowsiness. ? Make important decisions or sign legal documents. ? Take care of children on your own.  Rest. Eating and drinking  Follow the diet that is recommended by your health care provider.  If you vomit, drink water, juice, or soup when you can drink without vomiting.  Make sure you have little or no nausea before eating solid foods. General instructions  Have a responsible adult stay with you until you are awake and alert.  Take over-the-counter and prescription medicines only as told by your health care provider.  If you smoke, do not smoke without supervision.  Keep all follow-up visits as told by your health care provider. This is important. Contact a health care provider if:  You keep feeling nauseous or you keep vomiting.  You feel light-headed.  You develop a rash.  You  have  a fever. Get help right away if:  You have trouble breathing. This information is not intended to replace advice given to you by your health care provider. Make sure you discuss any questions you have with your health care provider. Document Released: 10/22/2015 Document Revised: 02/21/2016 Document Reviewed: 10/22/2015 Elsevier Interactive Patient Education  [employer].

## 2017-12-31 MED ORDER — CHLORHEXIDINE GLUCONATE CLOTH 2 % EX PADS: 6.0000 | MEDICATED_PAD | Freq: Once | CUTANEOUS | Status: DC

## 2018-01-01 ENCOUNTER — Other Ambulatory Visit: Payer: Self-pay

## 2018-01-01 ENCOUNTER — Encounter (HOSPITAL_COMMUNITY): Payer: Self-pay

## 2018-01-01 ENCOUNTER — Encounter (HOSPITAL_COMMUNITY)
Admission: RE | Admit: 2018-01-01 | Discharge: 2018-01-01 | Disposition: A | Discharge status: Home / Self Care | Payer: PPO | Source: Ambulatory Visit | Attending: Internal Medicine | Admitting: Internal Medicine

## 2018-01-01 DIAGNOSIS — I4510 Unspecified right bundle-branch block: Secondary | ICD-10-CM | POA: Diagnosis not present

## 2018-01-01 DIAGNOSIS — Z01812 Encounter for preprocedural laboratory examination: Secondary | ICD-10-CM | POA: Insufficient documentation

## 2018-01-01 DIAGNOSIS — B019 Varicella without complication: Secondary | ICD-10-CM | POA: Insufficient documentation

## 2018-01-01 DIAGNOSIS — R9431 Abnormal electrocardiogram [ECG] [EKG]: Secondary | ICD-10-CM | POA: Insufficient documentation

## 2018-01-01 DIAGNOSIS — K7469 Other cirrhosis of liver: Secondary | ICD-10-CM

## 2018-01-01 DIAGNOSIS — Z01810 Encounter for preprocedural cardiovascular examination: Secondary | ICD-10-CM | POA: Diagnosis not present

## 2018-01-01 LAB — BASIC METABOLIC PANEL
Anion gap: 9 (ref 5–15)
BUN: 16 mg/dL (ref 6–20)
CALCIUM: 9.1 mg/dL (ref 8.9–10.3)
CO2: 23 mmol/L (ref 22–32)
CREATININE: 1.12 mg/dL (ref 0.61–1.24)
Chloride: 107 mmol/L (ref 101–111)
GFR calc Af Amer: >60 mL/min (ref >60)
GFR calc non Af Amer: >60 mL/min (ref >60)
Glucose, Bld: 263 mg/dL — ABNORMAL HIGH (ref 65–99)
Potassium: 4.1 mmol/L (ref 3.5–5.1)
SODIUM: 139 mmol/L (ref 135–145)

## 2018-01-01 LAB — CBC
HCT: 45.8 % (ref 39.0–52.0)
Hemoglobin: 16.1 g/dL (ref 13.0–17.0)
MCH: 31.5 pg (ref 26.0–34.0)
MCHC: 35.2 g/dL (ref 30.0–36.0)
MCV: 89.6 fL (ref 78.0–100.0)
PLATELETS: 121 10*3/uL — AB (ref 150–400)
RBC: 5.11 MIL/uL (ref 4.22–5.81)
RDW: 12.7 % (ref 11.5–15.5)
WBC: 5.2 10*3/uL (ref 4.0–10.5)

## 2018-01-07 DIAGNOSIS — Z6827 Body mass index (BMI) 27.0-27.9, adult: Secondary | ICD-10-CM | POA: Diagnosis not present

## 2018-01-07 DIAGNOSIS — E119 Type 2 diabetes mellitus without complications: Secondary | ICD-10-CM | POA: Diagnosis not present

## 2018-01-07 DIAGNOSIS — Z0001 Encounter for general adult medical examination with abnormal findings: Secondary | ICD-10-CM | POA: Diagnosis not present

## 2018-01-07 DIAGNOSIS — D696 Thrombocytopenia, unspecified: Secondary | ICD-10-CM | POA: Diagnosis not present

## 2018-01-07 DIAGNOSIS — Z1389 Encounter for screening for other disorder: Secondary | ICD-10-CM | POA: Diagnosis not present

## 2018-01-07 DIAGNOSIS — E663 Overweight: Secondary | ICD-10-CM | POA: Diagnosis not present

## 2018-01-07 DIAGNOSIS — I10 Essential (primary) hypertension: Secondary | ICD-10-CM | POA: Diagnosis not present

## 2018-01-08 ENCOUNTER — Ambulatory Visit (HOSPITAL_COMMUNITY): Payer: PPO | Admitting: Anesthesiology

## 2018-01-08 ENCOUNTER — Encounter (HOSPITAL_COMMUNITY)
Admission: RE | Disposition: A | Discharge status: Home / Self Care | Payer: Self-pay | Source: Ambulatory Visit | Attending: Internal Medicine

## 2018-01-08 ENCOUNTER — Telehealth: Payer: Self-pay

## 2018-01-08 ENCOUNTER — Other Ambulatory Visit: Payer: Self-pay

## 2018-01-08 ENCOUNTER — Ambulatory Visit (HOSPITAL_COMMUNITY)
Admission: RE | Admit: 2018-01-08 | Discharge: 2018-01-08 | Disposition: A | Discharge status: Home / Self Care | Payer: PPO | Source: Ambulatory Visit | Attending: Internal Medicine | Admitting: Internal Medicine

## 2018-01-08 ENCOUNTER — Encounter (HOSPITAL_COMMUNITY): Payer: Self-pay | Admitting: *Deleted

## 2018-01-08 DIAGNOSIS — G4700 Insomnia, unspecified: Secondary | ICD-10-CM | POA: Diagnosis not present

## 2018-01-08 DIAGNOSIS — E119 Type 2 diabetes mellitus without complications: Secondary | ICD-10-CM | POA: Insufficient documentation

## 2018-01-08 DIAGNOSIS — R1310 Dysphagia, unspecified: Secondary | ICD-10-CM | POA: Diagnosis not present

## 2018-01-08 DIAGNOSIS — K766 Portal hypertension: Secondary | ICD-10-CM | POA: Diagnosis not present

## 2018-01-08 DIAGNOSIS — Z87891 Personal history of nicotine dependence: Secondary | ICD-10-CM | POA: Insufficient documentation

## 2018-01-08 DIAGNOSIS — K3189 Other diseases of stomach and duodenum: Secondary | ICD-10-CM

## 2018-01-08 DIAGNOSIS — I10 Essential (primary) hypertension: Secondary | ICD-10-CM | POA: Diagnosis not present

## 2018-01-08 DIAGNOSIS — K7581 Nonalcoholic steatohepatitis (NASH): Secondary | ICD-10-CM | POA: Insufficient documentation

## 2018-01-08 DIAGNOSIS — Z13810 Encounter for screening for upper gastrointestinal disorder: Secondary | ICD-10-CM | POA: Diagnosis not present

## 2018-01-08 DIAGNOSIS — K209 Esophagitis, unspecified: Secondary | ICD-10-CM | POA: Diagnosis not present

## 2018-01-08 DIAGNOSIS — K760 Fatty (change of) liver, not elsewhere classified: Secondary | ICD-10-CM

## 2018-01-08 DIAGNOSIS — B1920 Unspecified viral hepatitis C without hepatic coma: Secondary | ICD-10-CM | POA: Insufficient documentation

## 2018-01-08 DIAGNOSIS — Z79899 Other long term (current) drug therapy: Secondary | ICD-10-CM | POA: Insufficient documentation

## 2018-01-08 DIAGNOSIS — K589 Irritable bowel syndrome without diarrhea: Secondary | ICD-10-CM | POA: Diagnosis not present

## 2018-01-08 DIAGNOSIS — K210 Gastro-esophageal reflux disease with esophagitis: Secondary | ICD-10-CM | POA: Insufficient documentation

## 2018-01-08 DIAGNOSIS — Z7984 Long term (current) use of oral hypoglycemic drugs: Secondary | ICD-10-CM | POA: Diagnosis not present

## 2018-01-08 HISTORY — PX: ESOPHAGOGASTRODUODENOSCOPY (EGD) WITH PROPOFOL: SHX5813

## 2018-01-08 HISTORY — PX: MALONEY DILATION: SHX5535

## 2018-01-08 LAB — GLUCOSE, CAPILLARY: Glucose-Capillary: 114 mg/dL — ABNORMAL HIGH (ref 70–99)

## 2018-01-08 SURGERY — ESOPHAGOGASTRODUODENOSCOPY (EGD) WITH PROPOFOL
Anesthesia: Monitor Anesthesia Care

## 2018-01-08 MED ORDER — MIDAZOLAM HCL 5 MG/5ML IJ SOLN
INTRAMUSCULAR | Status: DC | PRN
  Administered 2018-01-08: 2 mg via INTRAVENOUS

## 2018-01-08 MED ORDER — PROPOFOL 500 MG/50ML IV EMUL
INTRAVENOUS | Status: DC | PRN
  Administered 2018-01-08: 150 ug/kg/min via INTRAVENOUS

## 2018-01-08 MED ORDER — LACTATED RINGERS IV SOLN
INTRAVENOUS | Status: DC
  Administered 2018-01-08: 1000 mL via INTRAVENOUS

## 2018-01-08 MED ORDER — PROPOFOL 10 MG/ML IV BOLUS
INTRAVENOUS | Status: AC
  Filled 2018-01-08: qty 40

## 2018-01-08 MED ORDER — MIDAZOLAM HCL 2 MG/2ML IJ SOLN
INTRAMUSCULAR | Status: AC
  Filled 2018-01-08: qty 2

## 2018-01-08 MED ORDER — LIDOCAINE HCL (PF) 1 % IJ SOLN
INTRAMUSCULAR | Status: AC
  Filled 2018-01-08: qty 10

## 2018-01-08 MED ORDER — LIDOCAINE VISCOUS HCL 2 % MT SOLN
OROMUCOSAL | Status: AC
  Filled 2018-01-08: qty 15

## 2018-01-08 NOTE — Transfer of Care (Signed)
Immediate Anesthesia Transfer of Care Note  Patient: Stephen Stephenson  Procedure(s) Performed: ESOPHAGOGASTRODUODENOSCOPY (EGD) WITH PROPOFOL (N/A ) MALONEY DILATION (N/A )  Patient Location: PACU  Anesthesia Type:MAC  Level of Consciousness: awake  Airway & Oxygen Therapy: Patient Spontanous Breathing  Post-op Assessment: Report given to RN  Post vital signs: Reviewed and stable  Last Vitals:  Vitals Value Taken Time  BP 118/78 01/08/2018  8:00 AM  Temp    Pulse 87 01/08/2018  8:00 AM  Resp 23 01/08/2018  8:00 AM  SpO2 93 % 01/08/2018  8:00 AM  Vitals shown include unvalidated device data.  Last Pain:  Vitals:   01/08/18 0628  TempSrc: Oral  PainSc: 0-No pain      Patients Stated Pain Goal: 9 (77/93/90 3009)  Complications: No apparent anesthesia complications

## 2018-01-08 NOTE — Anesthesia Postprocedure Evaluation (Signed)
Anesthesia Post Note  Patient: [name]  Procedure(s) Performed: ESOPHAGOGASTRODUODENOSCOPY (EGD) WITH PROPOFOL (N/A ) MALONEY DILATION (N/A )  Patient location during evaluation: PACU Anesthesia Type: MAC Level of consciousness: awake and alert and oriented Pain management: pain level controlled Vital Signs Assessment: post-procedure vital signs reviewed and stable Respiratory status: spontaneous breathing Cardiovascular status: stable Postop Assessment: no apparent nausea or vomiting Anesthetic complications: no     Last Vitals:  Vitals:   01/08/18 0715 01/08/18 0720  BP:    Pulse:    Resp: (!) 28 20  Temp:    SpO2: 92% 95%    Last Pain:  Vitals:   01/08/18 0628  TempSrc: Oral  PainSc: 0-No pain                 Donette Mainwaring

## 2018-01-08 NOTE — Telephone Encounter (Signed)
Thanks. That's perfect.

## 2018-01-08 NOTE — H&P (Signed)
done

## 2018-01-08 NOTE — H&P (Signed)
@LOGO @   Primary Care Physician:  Sharilyn Sites, MD Primary Gastroenterologist:  Dr. Gala Romney  Pre-Procedure History & Physical: HPI:  Stephen Stephenson is a 67 y.o. male here for screening EGD and further evaluation of esophageal dysphagia via EGD with possible ED.  Past Medical History:  Diagnosis Date  . DM (diabetes mellitus) ([place])    type II  . GERD (gastroesophageal reflux disease)   . Hb-AS genotype ([place])   . Hepatitis C    genotype 1 eradicated  . IBS (irritable bowel syndrome)   . Insomnia   . Kidney stone   . NASH (nonalcoholic steatohepatitis)    Metavir F3, 10/2013, patient reports completing hepatitis a and B vaccination. 2016    Past Surgical History:  Procedure Laterality Date  . BACK SURGERY    . CHOLECYSTECTOMY    . COLONOSCOPY  02/05/02   anal canal herrhoids ow normal/normal ileum/suspect pt bled from hemorrhoids presently  . COLONOSCOPY  09/27/2011   IBB:CWUGQBVQ hemorrhoids. Hepatic flexure polyp-removed TUBULAR ADENOMA, due for surveillance March 2016  . COLONOSCOPY N/A 01/20/2015   XIH:WTUUEK colonic polyp removed/colonic diverticulosis  . CYSTOSCOPY W/ URETERAL STENT PLACEMENT Left 02/03/2013   Procedure: CYSTOSCOPY WITH RETROGRADE PYELOGRAM/URETERAL STENT PLACEMENT;  Surgeon: Marissa Nestle, MD;  Location: [facility];  Service: Urology;  Laterality: Left;  . ESOPHAGOGASTRODUODENOSCOPY  05/17/05   normal esophagus/gastric mucosa appeared normal  . ESOPHAGOGASTRODUODENOSCOPY N/A 04/25/2014   Abnormal gastric mucosa, benign biopsies. No esophageal varices. Next EGD October 2018.  [name] EXTRACORPOREAL SHOCK WAVE LITHOTRIPSY Left 02/03/2013   Procedure: EXTRACORPOREAL SHOCK WAVE LITHOTRIPSY (ESWL) LEFT RENAL CALCULUS;  Surgeon: Marissa Nestle, MD;  Location: [facility];  Service: Urology;  Laterality: Left;  . KIDNEY STONE SURGERY    . SHOULDER SURGERY      Prior to Admission medications   Medication Sig Start Date End Date Taking? Authorizing Provider  cholecalciferol  (VITAMIN D) 1000 units tablet Take 1,000 Units by mouth daily.   Yes [provider]  glimepiride (AMARYL) 4 MG tablet Take 4 mg by mouth 2 (two) times daily.   Yes [provider]  potassium citrate (UROCIT-K) 10 MEQ (1080 MG) SR tablet Take 10 mEq by mouth 2 (two) times daily.    Yes [provider]  omeprazole (PRILOSEC) 20 MG capsule Take 1 capsule (20 mg total) by mouth daily. 30 minutes before breakfast. 06/13/11 09/24/11  [name], NP    Allergies as of 11/18/2017  . (No Known Allergies)    Family History  Problem Relation Age of Onset  . Liver disease Father        ? Hepatitis C; Alcoholic  . Colon cancer Neg Hx     Social History   Socioeconomic History  . Marital status: Married    Spouse name: Not on file  . Number of children: Not on file  . Years of education: Not on file  . Highest education level: Not on file  Occupational History  . Not on file  Social Needs  . Financial resource strain: Not on file  . Food insecurity:    Worry: Not on file    Inability: Not on file  . Transportation needs:    Medical: Not on file    Non-medical: Not on file  Tobacco Use  . Smoking status: Former Smoker    Packs/day: 1.00    Years: 25.00    Pack years: 25.00    Types: Cigarettes    Last attempt to quit:  08/02/1995    Years since quitting: 22.4  . Smokeless tobacco: Former Systems developer    Quit date: 10/12/1992  Substance and Sexual Activity  . Alcohol use: Yes    Alcohol/week: 0.0 oz    Comment: Occasionally; Average one drink every few months; Summer will occasionally drink a bottle of wine.  . Drug use: No  . Sexual activity: Yes    Birth control/protection: None  Lifestyle  . Physical activity:    Days per week: Not on file    Minutes per session: Not on file  . Stress: Not on file  Relationships  . Social connections:    Talks on phone: Not on file    Gets together: Not on file    Attends religious service: Not on file    Active  member of club or organization: Not on file    Attends meetings of clubs or organizations: Not on file    Relationship status: Not on file  . Intimate partner violence:    Fear of current or ex partner: Not on file    Emotionally abused: Not on file    Physically abused: Not on file    Forced sexual activity: Not on file  Other Topics Concern  . Not on file  Social History Narrative  . Not on file    Review of Systems: See HPI, otherwise negative ROS  Physical Exam: BP (!) 157/94   Pulse 83   Temp 97.9 F (36.6 C) (Oral)   Resp 20   SpO2 95%  General:   Alert,  Well-developed, well-nourished, pleasant and cooperative in NAD Neck:  Supple; no masses or thyromegaly. No significant cervical adenopathy. Lungs:  Clear throughout to auscultation.   No wheezes, crackles, or rhonchi. No acute distress. Heart:  Regular rate and rhythm; no murmurs, clicks, rubs,  or gallops. Abdomen: Non-distended, normal bowel sounds.  Soft and nontender without appreciable mass or hepatosplenomegaly.  Pulses:  Normal pulses noted. Extremities:  Without clubbing or edema.  Impression/Plan:  67 year old gentleman with cirrhosis here for screening EGD also vague esophageal dysphagia.  I've offered the patient EGD to further evaluate/treat.  The risks, benefits, limitations, alternatives and imponderables have been reviewed with the patient. Potential for esophageal dilation, biopsy, etc. have also been reviewed.  Questions have been answered. All parties agreeable.     Notice: This dictation was prepared with Dragon dictation along with smaller phrase technology. Any transcriptional errors that result from this process are unintentional and may not be corrected upon review.

## 2018-01-08 NOTE — Discharge Instructions (Signed)
EGD Discharge instructions Please read the instructions outlined below and refer to this sheet in the next few weeks. These discharge instructions provide you with general information on caring for yourself after you leave the hospital. Your doctor may also give you specific instructions. While your treatment has been planned according to the most current medical practices available, unavoidable complications occasionally occur. If you have any problems or questions after discharge, please call your doctor. ACTIVITY  You may resume your regular activity but move at a slower pace for the next 24 hours.   Take frequent rest periods for the next 24 hours.   Walking will help expel (get rid of) the air and reduce the bloated feeling in your abdomen.   No driving for 24 hours (because of the anesthesia (medicine) used during the test).   You may shower.   Do not sign any important legal documents or operate any machinery for 24 hours (because of the anesthesia used during the test).  NUTRITION  Drink plenty of fluids.   You may resume your normal diet.   Begin with a light meal and progress to your normal diet.   Avoid alcoholic beverages for 24 hours or as instructed by your caregiver.  MEDICATIONS  You may resume your normal medications unless your caregiver tells you otherwise.  WHAT YOU CAN EXPECT TODAY  You may experience abdominal discomfort such as a feeling of fullness or gas pains.  FOLLOW-UP  Your doctor will discuss the results of your test with you.  SEEK IMMEDIATE MEDICAL ATTENTION IF ANY OF THE FOLLOWING OCCUR:  Excessive nausea (feeling sick to your stomach) and/or vomiting.   Severe abdominal pain and distention (swelling).   Trouble swallowing.   Temperature over 101 F (37.8 C).   Rectal bleeding or vomiting of blood.    Repeat EGD in 2 years  Increase omeprazole 40 mg daily  GERD information provided  Office visit in 3 months or as already  scheduled

## 2018-01-08 NOTE — Telephone Encounter (Signed)
Pt is aware and will go to [facility] tomorrow.

## 2018-01-08 NOTE — Anesthesia Preprocedure Evaluation (Signed)
Anesthesia Evaluation  Patient identified by MRN, date of birth, ID band Patient awake    Reviewed: Allergy & Precautions, H&P , NPO status , Patient's Chart, lab work & pertinent test results  Airway Mallampati: II  TM Distance: >3 FB Neck ROM: full    Dental no notable dental hx.    Pulmonary neg pulmonary ROS, former smoker,    Pulmonary exam normal breath sounds clear to auscultation       Cardiovascular Exercise Tolerance: Good hypertension, negative cardio ROS   Rhythm:regular Rate:Normal     Neuro/Psych negative neurological ROS  negative psych ROS   GI/Hepatic negative GI ROS, Neg liver ROS, GERD  ,(+) Hepatitis -, C  Endo/Other  negative endocrine ROSdiabetes, Type 2  Renal/GU Renal diseasenegative Renal ROS  negative genitourinary   Musculoskeletal   Abdominal   Peds  Hematology negative hematology ROS (+)   Anesthesia Other Findings Kidney stones NAFLD (nonalcoholic fatty liver disease)  Reproductive/Obstetrics negative OB ROS                             Anesthesia Physical Anesthesia Plan  ASA: III  Anesthesia Plan: MAC   Post-op Pain Management:    Induction:   PONV Risk Score and Plan:   Airway Management Planned:   Additional Equipment:   Intra-op Plan:   Post-operative Plan:   Informed Consent: I have reviewed the patients History and Physical, chart, labs and discussed the procedure including the risks, benefits and alternatives for the proposed anesthesia with the patient or authorized representative who has indicated his/her understanding and acceptance.   Dental Advisory Given  Plan Discussed with: CRNA  Anesthesia Plan Comments:         Anesthesia Quick Evaluation

## 2018-01-08 NOTE — Telephone Encounter (Signed)
LMOM for a return call from pt.  

## 2018-01-08 NOTE — Op Note (Signed)
[facility] Patient Name: Stephen Stephenson Procedure Date: 01/08/2018 7:16 AM MRN: [id number] Date of Birth: 12-08-1950 Attending MD: Norvel Richards , MD CSN: [id number] Age: 67 Admit Type: Outpatient Procedure:                Upper GI endoscopy Indications:              Screening procedure, Dysphagia Providers:                Norvel Richards, MD, Janeece Riggers, RN, Randa Spike, Technician Referring MD:             Halford Chessman MD, MD Medicines:                Propofol per Anesthesia Complications:            No immediate complications. Estimated Blood Loss:     Estimated blood loss: none. Procedure:                Pre-Anesthesia Assessment:                           - Prior to the procedure, a History and Physical                            was performed, and patient medications and                            allergies were reviewed. The patient's tolerance of                            previous anesthesia was also reviewed. The risks                            and benefits of the procedure and the sedation                            options and risks were discussed with the patient.                            All questions were answered, and informed consent                            was obtained. Prior Anticoagulants: The patient has                            taken no previous anticoagulant or antiplatelet                            agents. ASA Grade Assessment: II - A patient with                            mild systemic disease. After reviewing the risks  and benefits, the patient was deemed in                            satisfactory condition to undergo the procedure.                           After obtaining informed consent, the endoscope was                            passed under direct vision. Throughout the                            procedure, the patient's blood pressure, pulse, and            oxygen saturations were monitored continuously. The                            [id number]) scope was introduced through the                            and advanced to the second part of duodenum. The                            upper GI endoscopy was accomplished without                            difficulty. The patient tolerated the procedure                            well. Scope In: 7:44:59 AM Scope Out: 7:50:32 AM Total Procedure Duration: 0 hours 5 minutes 33 seconds  Findings:      Esophagitis was found. Distal esophageal erosions within 5 mm of the GE       junction. No Barrett's epithelium. No varices. Tubular esophagus pain       throughout his course. Estimated blood loss: none. The scope was       withdrawn. Dilation was performed with a Maloney dilator with mild       resistance at 56 Fr. The dilation site was examined following endoscope       reinsertion and showed no change. Estimated blood loss: none.      Portal hypertensive gastropathy was found in the entire examined stomach.      The duodenal bulb and second portion of the duodenum were normal. Impression:               - Mild erosive reflux esophagitis. Status post                            Maloney dilation.                           - Portal hypertensive gastropathy.                           - Normal duodenal bulb and second portion of the  duodenum.                           - No specimens collected. Moderate Sedation:      Moderate (conscious) sedation was personally administered by an       anesthesia professional. The following parameters were monitored: oxygen       saturation, heart rate, blood pressure, respiratory rate, EKG, adequacy       of pulmonary ventilation, and response to care. Total physician       intraservice time was 16 minutes. Recommendation:           - Repeat upper endoscopy in 2 years for screening                            purposes.                            - Return to GI clinic in 3 months. Increase                            omeprazole to 40 mg daily. Will retrieve Dr.                            Delanna Ahmadi labs. Procedure Code(s):        --- Professional ---                           [phone], Esophagogastroduodenoscopy, flexible,                            transoral; diagnostic, including collection of                            specimen(s) by brushing or washing, when performed                            (separate procedure)                           43450, Dilation of esophagus, by unguided sound or                            bougie, single or multiple passes Diagnosis Code(s):        --- Professional ---                           K20.9, Esophagitis, unspecified                           K76.6, Portal hypertension                           K31.89, Other diseases of stomach and duodenum                           Z13.810, Encounter for screening for upper  gastrointestinal disorder                           R13.10, Dysphagia, unspecified CPT copyright 2017 American Medical Association. All rights reserved. The codes documented in this report are preliminary and upon coder review may  be revised to meet current compliance requirements. Cristopher Estimable. Drena Ham, MD Norvel Richards, MD 01/08/2018 8:04:31 AM This report has been signed electronically. Number of Addenda: 0

## 2018-01-08 NOTE — Telephone Encounter (Signed)
Dr. Gala Romney,   Elmo Putt is off today.  I have printed the recent labs ordered by Dr. Hilma Favors and and Dr. Zadie Rhine from [place].  Pt had labs on 01/07/2018 that consists of:  Lipid panel PSA CBC with diff and platelet CMP  Urine albumin  I am faxing these results to you at Endo.   I will order the PT/INR.

## 2018-01-08 NOTE — Telephone Encounter (Signed)
Rourk, Cristopher Estimable, MD  Derrick Ravel, CMA        Needs chem 12, INR and CBC. States he has had labs from Dr. Hilma Favors. We need to retrieve them. If the above is not included we need to get what they did not do perform

## 2018-01-09 DIAGNOSIS — K760 Fatty (change of) liver, not elsewhere classified: Secondary | ICD-10-CM | POA: Diagnosis not present

## 2018-01-10 LAB — PROTIME-INR
INR: 1 (ref 0.8–1.2)
PROTHROMBIN TIME: 10.5 s (ref 9.1–12.0)

## 2018-01-12 ENCOUNTER — Encounter (HOSPITAL_COMMUNITY): Payer: Self-pay | Admitting: Internal Medicine

## 2018-01-12 NOTE — Progress Notes (Signed)
LMOM to call.

## 2018-01-13 NOTE — Progress Notes (Signed)
PT is aware.

## 2018-02-23 ENCOUNTER — Encounter: Payer: Self-pay | Admitting: Internal Medicine

## 2018-02-27 DIAGNOSIS — N200 Calculus of kidney: Secondary | ICD-10-CM | POA: Diagnosis not present

## 2018-04-10 ENCOUNTER — Encounter: Payer: Self-pay | Admitting: Internal Medicine

## 2018-04-10 ENCOUNTER — Ambulatory Visit (INDEPENDENT_AMBULATORY_CARE_PROVIDER_SITE_OTHER): Payer: PPO | Admitting: Internal Medicine

## 2018-04-10 VITALS — BP 143/87 | HR 92 | Temp 96.9°F | Ht 72.0 in | Wt 212.8 lb

## 2018-04-10 DIAGNOSIS — K219 Gastro-esophageal reflux disease without esophagitis: Secondary | ICD-10-CM | POA: Diagnosis not present

## 2018-04-10 DIAGNOSIS — K7469 Other cirrhosis of liver: Secondary | ICD-10-CM

## 2018-04-10 NOTE — Patient Instructions (Signed)
Continue Omeprazole 20 mg daily  Ultrasound of liver every 6 months to a year  Repeat EGD and colonoscopy in 2021

## 2018-04-10 NOTE — Progress Notes (Signed)
Primary Care Physician:  Sharilyn Sites, MD Primary Gastroenterologist:  Dr. Gala Romney  Pre-Procedure History & Physical: HPI:  Stephen Stephenson is a 67 y.o. male here for follow-up of GERD and cirrhosis.  Status post esophageal dilation.  No longer having dysphagia.  Reflux symptoms well controlled on PPI.  Has been vaccinated against hepatitis A and C.  History of colonic adenoma due for screening EGD and surveillance colonoscopy 2021.  Past Medical History:  Diagnosis Date  . DM (diabetes mellitus) ([place])    type II  . GERD (gastroesophageal reflux disease)   . Hb-AS genotype ([place])   . Hepatitis C    genotype 1 eradicated  . IBS (irritable bowel syndrome)   . Insomnia   . Kidney stone   . NASH (nonalcoholic steatohepatitis)    Metavir F3, 10/2013, patient reports completing hepatitis a and B vaccination. 2016    Past Surgical History:  Procedure Laterality Date  . BACK SURGERY    . CHOLECYSTECTOMY    . COLONOSCOPY  02/05/02   anal canal herrhoids ow normal/normal ileum/suspect pt bled from hemorrhoids presently  . COLONOSCOPY  09/27/2011   WUJ:[id number] hemorrhoids. Hepatic flexure polyp-removed TUBULAR ADENOMA, due for surveillance March 2016  . COLONOSCOPY N/A 01/20/2015   GNF:AOZHYQ colonic polyp removed/colonic diverticulosis  . CYSTOSCOPY W/ URETERAL STENT PLACEMENT Left 02/03/2013   Procedure: CYSTOSCOPY WITH RETROGRADE PYELOGRAM/URETERAL STENT PLACEMENT;  Surgeon: Marissa Nestle, MD;  Location: [facility];  Service: Urology;  Laterality: Left;  . ESOPHAGOGASTRODUODENOSCOPY  05/17/05   normal esophagus/gastric mucosa appeared normal  . ESOPHAGOGASTRODUODENOSCOPY N/A 04/25/2014   Abnormal gastric mucosa, benign biopsies. No esophageal varices. Next EGD October 2018.  [name] ESOPHAGOGASTRODUODENOSCOPY (EGD) WITH PROPOFOL N/A 01/08/2018   Procedure: ESOPHAGOGASTRODUODENOSCOPY (EGD) WITH PROPOFOL;  Surgeon: [name], MD;  Location: [facility];  Service: Endoscopy;  Laterality:  N/A;  7:30am  . EXTRACORPOREAL SHOCK WAVE LITHOTRIPSY Left 02/03/2013   Procedure: EXTRACORPOREAL SHOCK WAVE LITHOTRIPSY (ESWL) LEFT RENAL CALCULUS;  Surgeon: Marissa Nestle, MD;  Location: [facility];  Service: Urology;  Laterality: Left;  . KIDNEY STONE SURGERY    . MALONEY DILATION N/A 01/08/2018   Procedure: Venia Minks DILATION;  Surgeon: [name], MD;  Location: [facility];  Service: Endoscopy;  Laterality: N/A;  . SHOULDER SURGERY      Prior to Admission medications   Medication Sig Start Date End Date Taking? Authorizing Provider  glimepiride (AMARYL) 4 MG tablet Take 4 mg by mouth 2 (two) times daily.   Yes [provider]  omeprazole (PRILOSEC) 40 MG capsule Take 1 capsule by mouth daily. 03/29/18  Yes [provider]  potassium citrate (UROCIT-K) 10 MEQ (1080 MG) SR tablet Take 10 mEq by mouth 2 (two) times daily.    Yes [provider]  cholecalciferol (VITAMIN D) 1000 units tablet Take 1,000 Units by mouth daily.    [provider]    Allergies as of 04/10/2018  . (No Known Allergies)    Family History  Problem Relation Age of Onset  . Liver disease Father        ? Hepatitis C; Alcoholic  . Colon cancer Neg Hx     Social History   Socioeconomic History  . Marital status: Married    Spouse name: Not on file  . Number of children: Not on file  . Years of education: Not on file  . Highest education level: Not on file  Occupational History  . Not on file  Social Needs  . Financial resource strain: Not on file  . Food insecurity:    Worry: Not on file    Inability: Not on file  . Transportation needs:    Medical: Not on file    Non-medical: Not on file  Tobacco Use  . Smoking status: Former Smoker    Packs/day: 1.00    Years: 25.00    Pack years: 25.00    Types: Cigarettes    Last attempt to quit: 08/02/1995    Years since quitting: 22.7  . Smokeless tobacco: Former Systems developer    Quit date: 10/12/1992  Substance and Sexual  Activity  . Alcohol use: Yes    Alcohol/week: 0.0 standard drinks    Comment: Occasionally; Average one drink every few months; Summer will occasionally drink a bottle of wine.  . Drug use: No  . Sexual activity: Yes    Birth control/protection: None  Lifestyle  . Physical activity:    Days per week: Not on file    Minutes per session: Not on file  . Stress: Not on file  Relationships  . Social connections:    Talks on phone: Not on file    Gets together: Not on file    Attends religious service: Not on file    Active member of club or organization: Not on file    Attends meetings of clubs or organizations: Not on file    Relationship status: Not on file  . Intimate partner violence:    Fear of current or ex partner: Not on file    Emotionally abused: Not on file    Physically abused: Not on file    Forced sexual activity: Not on file  Other Topics Concern  . Not on file  Social History Narrative  . Not on file    Review of Systems: See HPI, otherwise negative ROS  Physical Exam: BP (!) 143/87   Pulse 92   Temp (!) 96.9 F (36.1 C) (Oral)   Ht 6' (1.829 m)   Wt 212 lb 12.8 oz (96.5 kg)   BMI 28.86 kg/m  General:   Alert,  Well-developed, well-nourished, pleasant and cooperative in NAD  Neck:  Supple; no masses or thyromegaly. No significant cervical adenopathy. Lungs:  Clear throughout to auscultation.   No wheezes, crackles, or rhonchi. No acute distress. Heart:  Regular rate and rhythm; no murmurs, clicks, rubs,  or gallops. Abdomen: Non-distended, normal bowel sounds.  Soft and nontender without appreciable mass or hepatosplenomegaly.  Pulses:  Normal pulses noted. Extremities:  Without clubbing or edema.  Impression/Plan: 67 year old gentleman with a history of HCV cirrhosis (status post eradication with SVR-documented).   GERD well-controlled on PPI.  History of colonic adenoma.  Recommendations:Continue Omeprazole 20 mg daily  Ultrasound of liver every  6 months to a year  Repeat EGD and colonoscopy in 2021    Notice: This dictation was prepared with Dragon dictation along with smaller phrase technology. Any transcriptional errors that result from this process are unintentional and may not be corrected upon review.

## 2018-05-21 DIAGNOSIS — Z23 Encounter for immunization: Secondary | ICD-10-CM | POA: Diagnosis not present

## 2018-07-02 DIAGNOSIS — E782 Mixed hyperlipidemia: Secondary | ICD-10-CM | POA: Diagnosis not present

## 2018-07-02 DIAGNOSIS — E559 Vitamin D deficiency, unspecified: Secondary | ICD-10-CM | POA: Diagnosis not present

## 2018-07-02 DIAGNOSIS — E663 Overweight: Secondary | ICD-10-CM | POA: Diagnosis not present

## 2018-07-02 DIAGNOSIS — I10 Essential (primary) hypertension: Secondary | ICD-10-CM | POA: Diagnosis not present

## 2018-07-02 DIAGNOSIS — E7849 Other hyperlipidemia: Secondary | ICD-10-CM | POA: Diagnosis not present

## 2018-07-02 DIAGNOSIS — Z6828 Body mass index (BMI) 28.0-28.9, adult: Secondary | ICD-10-CM | POA: Diagnosis not present

## 2018-07-02 DIAGNOSIS — E119 Type 2 diabetes mellitus without complications: Secondary | ICD-10-CM | POA: Diagnosis not present

## 2018-07-28 DIAGNOSIS — E119 Type 2 diabetes mellitus without complications: Secondary | ICD-10-CM | POA: Diagnosis not present

## 2018-08-18 ENCOUNTER — Telehealth: Payer: Self-pay

## 2018-08-18 MED ORDER — OMEPRAZOLE 40 MG PO CPDR: 40.0000 mg | DELAYED_RELEASE_CAPSULE | Freq: Every day | ORAL | 5 refills | Status: DC

## 2018-08-18 NOTE — Telephone Encounter (Signed)
Done

## 2018-08-18 NOTE — Telephone Encounter (Signed)
Refill request from [facility]  for Omeprazole 40 mg # 30, take 1 capsule po once daily. Pt has been without medication for 2 weeks.

## 2018-08-18 NOTE — Addendum Note (Signed)
Addended by: [name] on: 08/18/2018 04:50 PM   Modules accepted: Orders

## 2018-10-26 DIAGNOSIS — Z681 Body mass index (BMI) 19 or less, adult: Secondary | ICD-10-CM | POA: Diagnosis not present

## 2018-10-26 DIAGNOSIS — I491 Atrial premature depolarization: Secondary | ICD-10-CM | POA: Diagnosis not present

## 2018-10-26 DIAGNOSIS — E119 Type 2 diabetes mellitus without complications: Secondary | ICD-10-CM | POA: Diagnosis not present

## 2018-10-26 DIAGNOSIS — Z0000 Encounter for general adult medical examination without abnormal findings: Secondary | ICD-10-CM | POA: Diagnosis not present

## 2018-10-26 DIAGNOSIS — Z1389 Encounter for screening for other disorder: Secondary | ICD-10-CM | POA: Diagnosis not present

## 2018-10-26 DIAGNOSIS — H401131 Primary open-angle glaucoma, bilateral, mild stage: Secondary | ICD-10-CM | POA: Diagnosis not present

## 2018-10-26 DIAGNOSIS — R509 Fever, unspecified: Secondary | ICD-10-CM | POA: Diagnosis not present

## 2018-10-26 DIAGNOSIS — R4020 Unspecified coma: Secondary | ICD-10-CM | POA: Diagnosis not present

## 2018-10-26 DIAGNOSIS — R0902 Hypoxemia: Secondary | ICD-10-CM | POA: Diagnosis not present

## 2018-10-26 DIAGNOSIS — R42 Dizziness and giddiness: Secondary | ICD-10-CM | POA: Diagnosis not present

## 2018-10-26 DIAGNOSIS — R55 Syncope and collapse: Secondary | ICD-10-CM | POA: Diagnosis not present

## 2018-10-26 DIAGNOSIS — H35033 Hypertensive retinopathy, bilateral: Secondary | ICD-10-CM | POA: Diagnosis not present

## 2018-10-26 DIAGNOSIS — H35372 Puckering of macula, left eye: Secondary | ICD-10-CM | POA: Diagnosis not present

## 2018-10-26 DIAGNOSIS — Z23 Encounter for immunization: Secondary | ICD-10-CM | POA: Diagnosis not present

## 2019-01-19 DIAGNOSIS — Z6827 Body mass index (BMI) 27.0-27.9, adult: Secondary | ICD-10-CM | POA: Diagnosis not present

## 2019-01-19 DIAGNOSIS — M549 Dorsalgia, unspecified: Secondary | ICD-10-CM | POA: Diagnosis not present

## 2019-01-19 DIAGNOSIS — D696 Thrombocytopenia, unspecified: Secondary | ICD-10-CM | POA: Diagnosis not present

## 2019-01-19 DIAGNOSIS — E119 Type 2 diabetes mellitus without complications: Secondary | ICD-10-CM | POA: Diagnosis not present

## 2019-01-19 DIAGNOSIS — E7849 Other hyperlipidemia: Secondary | ICD-10-CM | POA: Diagnosis not present

## 2019-01-19 DIAGNOSIS — I10 Essential (primary) hypertension: Secondary | ICD-10-CM | POA: Diagnosis not present

## 2019-01-19 DIAGNOSIS — Z1389 Encounter for screening for other disorder: Secondary | ICD-10-CM | POA: Diagnosis not present

## 2019-01-19 DIAGNOSIS — E559 Vitamin D deficiency, unspecified: Secondary | ICD-10-CM | POA: Diagnosis not present

## 2019-01-19 DIAGNOSIS — E663 Overweight: Secondary | ICD-10-CM | POA: Diagnosis not present

## 2019-03-29 ENCOUNTER — Encounter: Payer: Self-pay | Admitting: Internal Medicine

## 2019-04-14 DIAGNOSIS — Z23 Encounter for immunization: Secondary | ICD-10-CM | POA: Diagnosis not present

## 2019-05-04 ENCOUNTER — Other Ambulatory Visit: Payer: Self-pay

## 2019-05-04 ENCOUNTER — Telehealth: Payer: Self-pay

## 2019-05-04 ENCOUNTER — Ambulatory Visit (INDEPENDENT_AMBULATORY_CARE_PROVIDER_SITE_OTHER): Payer: PPO | Admitting: Internal Medicine

## 2019-05-04 ENCOUNTER — Encounter: Payer: Self-pay | Admitting: Internal Medicine

## 2019-05-04 VITALS — BP 156/83 | HR 74 | Temp 96.7°F | Ht 72.0 in | Wt 211.6 lb

## 2019-05-04 DIAGNOSIS — R1900 Intra-abdominal and pelvic swelling, mass and lump, unspecified site: Secondary | ICD-10-CM | POA: Diagnosis not present

## 2019-05-04 DIAGNOSIS — K219 Gastro-esophageal reflux disease without esophagitis: Secondary | ICD-10-CM | POA: Diagnosis not present

## 2019-05-04 DIAGNOSIS — R1030 Lower abdominal pain, unspecified: Secondary | ICD-10-CM

## 2019-05-04 NOTE — Progress Notes (Signed)
Primary Care Physician:  Sharilyn Sites, MD Primary Gastroenterologist:  Dr. Gala Romney  Pre-Procedure History & Physical: HPI:  Stephen Stephenson is a 68 y.o. male here for here for follow-up.  History of HCV-eradicated.  Increased fibrosis.;  Due for esophageal variceal screening next year.  History of colonic adenoma; due for surveillance colonoscopy 2021 as well.  Patient notes intermittent lower abdominal swelling joint swelling and bulging after he eats from time to time has any difficulties with change in bowel habits or bleeding.  Feels he has a hernia.  Past Medical History:  Diagnosis Date  . DM (diabetes mellitus) (Stephen Stephenson)    type II  . GERD (gastroesophageal reflux disease)   . Hb-AS genotype (Stephen Stephenson)   . Hepatitis C    genotype 1 eradicated  . IBS (irritable bowel syndrome)   . Insomnia   . Kidney stone   . NASH (nonalcoholic steatohepatitis)    Metavir F3, 10/2013, patient reports completing hepatitis a and B vaccination. 2016    Past Surgical History:  Procedure Laterality Date  . BACK SURGERY    . CHOLECYSTECTOMY    . COLONOSCOPY  02/05/02   anal canal herrhoids ow normal/normal ileum/suspect pt bled from hemorrhoids presently  . COLONOSCOPY  09/27/2011   [id number] hemorrhoids. Hepatic flexure polyp-removed TUBULAR ADENOMA, due for surveillance March 2016  . COLONOSCOPY N/A 01/20/2015   [id number] colonic polyp removed/colonic diverticulosis  . CYSTOSCOPY W/ URETERAL STENT PLACEMENT Left 02/03/2013   Procedure: CYSTOSCOPY WITH RETROGRADE PYELOGRAM/URETERAL STENT PLACEMENT;  Surgeon: Stephen Nestle, MD;  Location: [facility];  Service: Urology;  Laterality: Left;  . ESOPHAGOGASTRODUODENOSCOPY  05/17/05   normal esophagus/gastric mucosa appeared normal  . ESOPHAGOGASTRODUODENOSCOPY N/A 04/25/2014   Abnormal gastric mucosa, benign biopsies. No esophageal varices. Next EGD October 2018.  [name] ESOPHAGOGASTRODUODENOSCOPY (EGD) WITH PROPOFOL N/A 01/08/2018   Procedure:  ESOPHAGOGASTRODUODENOSCOPY (EGD) WITH PROPOFOL;  Surgeon: Stephen Dolin, MD;  Location: [facility];  Service: Endoscopy;  Laterality: N/A;  7:30am  . EXTRACORPOREAL SHOCK WAVE LITHOTRIPSY Left 02/03/2013   Procedure: EXTRACORPOREAL SHOCK WAVE LITHOTRIPSY (ESWL) LEFT RENAL CALCULUS;  Surgeon: Stephen Nestle, MD;  Location: [facility];  Service: Urology;  Laterality: Left;  . KIDNEY STONE SURGERY    . MALONEY DILATION N/A 01/08/2018   Procedure: Venia Minks DILATION;  Surgeon: Stephen Dolin, MD;  Location: [facility];  Service: Endoscopy;  Laterality: N/A;  . SHOULDER SURGERY      Prior to Admission medications   Medication Sig Start Date End Date Taking? Authorizing Provider  glimepiride (AMARYL) 4 MG tablet Take 4 mg by mouth 2 (two) times daily.   Yes [provider]  potassium citrate (UROCIT-K) 10 MEQ (1080 MG) SR tablet Take 10 mEq by mouth 2 (two) times daily.    Yes [provider]  cholecalciferol (VITAMIN D) 1000 units tablet Take 1,000 Units by mouth daily.    [provider]  omeprazole (PRILOSEC) 40 MG capsule Take 1 capsule (40 mg total) by mouth daily. Patient not taking: Reported on 05/04/2019 08/18/18   Stephen Needs, NP    Allergies as of 05/04/2019  . (No Known Allergies)    Family History  Problem Relation Age of Onset  . Liver disease Father        ? Hepatitis C; Alcoholic  . Colon cancer Neg Hx     Social History   Socioeconomic History  . Marital status: Married    Spouse name: Not on file  .  Number of children: Not on file  . Years of education: Not on file  . Highest education level: Not on file  Occupational History  . Not on file  Social Stephenson  . Financial resource strain: Not on file  . Food insecurity    Worry: Not on file    Inability: Not on file  . Transportation Stephenson    Medical: Not on file    Non-medical: Not on file  Tobacco Use  . Smoking status: Former Smoker    Packs/day: 1.00    Years: 25.00    Pack  years: 25.00    Types: Cigarettes    Quit date: 08/02/1995    Years since quitting: 23.7  . Smokeless tobacco: Former Systems developer    Quit date: 10/12/1992  Substance and Sexual Activity  . Alcohol use: Not Currently    Alcohol/week: 0.0 standard drinks    Comment: Occasionally; Average one drink every few months; Summer will occasionally drink a bottle of wine.  . Drug use: No  . Sexual activity: Yes    Birth control/protection: None  Lifestyle  . Physical activity    Days per week: Not on file    Minutes per session: Not on file  . Stress: Not on file  Relationships  . Social Herbalist on phone: Not on file    Gets together: Not on file    Attends religious service: Not on file    Active member of club or organization: Not on file    Attends meetings of clubs or organizations: Not on file    Relationship status: Not on file  . Intimate partner violence    Fear of current or ex partner: Not on file    Emotionally abused: Not on file    Physically abused: Not on file    Forced sexual activity: Not on file  Other Topics Concern  . Not on file  Social History Narrative  . Not on file    Review of Systems: See HPI, otherwise negative ROS  Physical Exam: BP (!) 156/83   Pulse 74   Temp (!) 96.7 F (35.9 C)   Ht 6' (1.829 m)   Wt 211 lb 9.6 oz (96 kg)   BMI 28.70 kg/m  General:   Alert,  , pleasant and cooperative in NAD Lungs:  Clear throughout to auscultation.   No wheezes, crackles, or rhonchi. No acute distress. Heart:  Regular rate and rhythm; no murmurs, clicks, rubs,  or gallops. Abdomen: Nondistended.  Soft he does appear to have diastases recti no obvious overt inguinal or umbilical hernia.  When he stands his lower abdomen does protrude a bit.  No obvious mass organomegaly Pulses:  Normal pulses noted. Extremities:  Without clubbing or edema.  Impression/Plan: Pleasant 68 year old gentleman with HCV fibrosis due for screening EGD next year.  History  colonic adenoma removed previously; due for surveillance colonoscopy next year as well.  Abdominal bulging.  Abdominal complaints as outlined above.  May have an occult hernia or other process contributing to his symptoms. GERD well-controlled on omeprazole. Plan for further evaluation.  Recommendations:  Schedule contrast CT of abdomen and pelvis -  Post-prandial lower abdominal pain and swelling - evaluate for hernia  Continue omeprazole 40 mg daily  Plan for EGD and colonoscopy  -  Spring 2021  Further recommendations to follow    Notice: This dictation was prepared with Dragon dictation along with smaller phrase technology. Any transcriptional errors that result from  this process are unintentional and may not be corrected upon review.

## 2019-05-04 NOTE — Progress Notes (Unsigned)
ct 

## 2019-05-04 NOTE — Telephone Encounter (Signed)
CT abd/pelvis w/contrast scheduled for 05/19/19 at 8:00am, arrive at 7:45am. NPO 4 hours prior to test. Pick up contrast. Needs creatinine prior to test.  Called and informed pt of appt. Appt letter mailed with lab order.

## 2019-05-04 NOTE — Patient Instructions (Signed)
Schedule contrast CT of abdomen and pelvis -  Post-prandial lower abdominal pain and swelling - evaluate for hernia  Continue omeprazole 40 mg daily  Plan for EGD and colonoscopy  -  Spring 2021  Further recommendations to follow

## 2019-05-14 DIAGNOSIS — R1030 Lower abdominal pain, unspecified: Secondary | ICD-10-CM | POA: Diagnosis not present

## 2019-05-14 DIAGNOSIS — R1900 Intra-abdominal and pelvic swelling, mass and lump, unspecified site: Secondary | ICD-10-CM | POA: Diagnosis not present

## 2019-05-15 LAB — CREATININE, SERUM: Creat: 1.01 mg/dL (ref 0.70–1.25)

## 2019-05-19 ENCOUNTER — Ambulatory Visit (HOSPITAL_COMMUNITY)
Admission: RE | Admit: 2019-05-19 | Discharge: 2019-05-19 | Disposition: A | Discharge status: Home / Self Care | Payer: PPO | Source: Ambulatory Visit | Attending: Internal Medicine | Admitting: Internal Medicine

## 2019-05-19 ENCOUNTER — Other Ambulatory Visit: Payer: Self-pay

## 2019-05-19 DIAGNOSIS — R1900 Intra-abdominal and pelvic swelling, mass and lump, unspecified site: Secondary | ICD-10-CM | POA: Insufficient documentation

## 2019-05-19 DIAGNOSIS — R109 Unspecified abdominal pain: Secondary | ICD-10-CM | POA: Diagnosis not present

## 2019-05-19 DIAGNOSIS — R1030 Lower abdominal pain, unspecified: Secondary | ICD-10-CM

## 2019-05-19 MED ORDER — IOHEXOL 300 MG/ML  SOLN
100.0000 mL | Freq: Once | INTRAMUSCULAR | Status: AC | PRN
  Administered 2019-05-19: 100 mL via INTRAVENOUS

## 2019-06-07 DIAGNOSIS — N200 Calculus of kidney: Secondary | ICD-10-CM | POA: Diagnosis not present

## 2019-06-16 DIAGNOSIS — Z20828 Contact with and (suspected) exposure to other viral communicable diseases: Secondary | ICD-10-CM | POA: Diagnosis not present

## 2019-06-16 DIAGNOSIS — Z9189 Other specified personal risk factors, not elsewhere classified: Secondary | ICD-10-CM | POA: Diagnosis not present

## 2019-06-16 DIAGNOSIS — E119 Type 2 diabetes mellitus without complications: Secondary | ICD-10-CM | POA: Diagnosis not present

## 2019-06-16 DIAGNOSIS — U071 COVID-19: Secondary | ICD-10-CM | POA: Diagnosis not present

## 2019-06-20 ENCOUNTER — Other Ambulatory Visit: Payer: Self-pay | Admitting: Gastroenterology

## 2019-06-21 DIAGNOSIS — E119 Type 2 diabetes mellitus without complications: Secondary | ICD-10-CM | POA: Diagnosis not present

## 2019-06-21 DIAGNOSIS — U071 COVID-19: Secondary | ICD-10-CM | POA: Diagnosis not present

## 2019-06-21 DIAGNOSIS — Z9189 Other specified personal risk factors, not elsewhere classified: Secondary | ICD-10-CM | POA: Diagnosis not present

## 2019-06-30 ENCOUNTER — Telehealth: Payer: Self-pay

## 2019-06-30 NOTE — Telephone Encounter (Signed)
-----   Message from [name], MD sent at 06/30/2019 10:35 AM EST ----- I believe she is a pediatric patient with GI issues.  Please round up numbers for the peds person in [place] and [facility];  provide him with a couple of telephone numbers.  I do not know those folks.  I do not have a preference. Please confirm his daughter is less than 18.  Thanks. ----- Message ----- From: Theadora Rama Sent: 06/30/2019  10:11 AM EST To: [name], MD  Mr Hausmann called and said he had spoke to you about his daughter, Stephen Stephenson 08/08/2007 and her medical condition. He said that you had offered to do a referral to a doctor or recommend a doctor for her. Mr Salguero can be reached at [phone]

## 2019-07-26 DIAGNOSIS — E663 Overweight: Secondary | ICD-10-CM | POA: Diagnosis not present

## 2019-07-26 DIAGNOSIS — Z0001 Encounter for general adult medical examination with abnormal findings: Secondary | ICD-10-CM | POA: Diagnosis not present

## 2019-07-26 DIAGNOSIS — Z1389 Encounter for screening for other disorder: Secondary | ICD-10-CM | POA: Diagnosis not present

## 2019-07-26 DIAGNOSIS — U071 COVID-19: Secondary | ICD-10-CM | POA: Diagnosis not present

## 2019-07-26 DIAGNOSIS — Z6827 Body mass index (BMI) 27.0-27.9, adult: Secondary | ICD-10-CM | POA: Diagnosis not present

## 2019-07-26 DIAGNOSIS — E1165 Type 2 diabetes mellitus with hyperglycemia: Secondary | ICD-10-CM | POA: Diagnosis not present

## 2019-07-26 DIAGNOSIS — Z20828 Contact with and (suspected) exposure to other viral communicable diseases: Secondary | ICD-10-CM | POA: Diagnosis not present

## 2019-07-26 DIAGNOSIS — E119 Type 2 diabetes mellitus without complications: Secondary | ICD-10-CM | POA: Diagnosis not present

## 2019-08-15 DIAGNOSIS — E1165 Type 2 diabetes mellitus with hyperglycemia: Secondary | ICD-10-CM | POA: Diagnosis not present

## 2019-08-15 DIAGNOSIS — I10 Essential (primary) hypertension: Secondary | ICD-10-CM | POA: Diagnosis not present

## 2019-08-15 DIAGNOSIS — E7849 Other hyperlipidemia: Secondary | ICD-10-CM | POA: Diagnosis not present

## 2019-08-24 DIAGNOSIS — L309 Dermatitis, unspecified: Secondary | ICD-10-CM | POA: Diagnosis not present

## 2019-08-24 DIAGNOSIS — Z6826 Body mass index (BMI) 26.0-26.9, adult: Secondary | ICD-10-CM | POA: Diagnosis not present

## 2019-08-24 DIAGNOSIS — E663 Overweight: Secondary | ICD-10-CM | POA: Diagnosis not present

## 2019-08-24 DIAGNOSIS — B182 Chronic viral hepatitis C: Secondary | ICD-10-CM | POA: Diagnosis not present

## 2019-09-07 DIAGNOSIS — E119 Type 2 diabetes mellitus without complications: Secondary | ICD-10-CM | POA: Diagnosis not present

## 2019-09-12 DIAGNOSIS — I10 Essential (primary) hypertension: Secondary | ICD-10-CM | POA: Diagnosis not present

## 2019-09-12 DIAGNOSIS — E7849 Other hyperlipidemia: Secondary | ICD-10-CM | POA: Diagnosis not present

## 2019-09-12 DIAGNOSIS — E1165 Type 2 diabetes mellitus with hyperglycemia: Secondary | ICD-10-CM | POA: Diagnosis not present

## 2019-11-09 DIAGNOSIS — Z6826 Body mass index (BMI) 26.0-26.9, adult: Secondary | ICD-10-CM | POA: Diagnosis not present

## 2019-11-09 DIAGNOSIS — E663 Overweight: Secondary | ICD-10-CM | POA: Diagnosis not present

## 2019-11-09 DIAGNOSIS — E1165 Type 2 diabetes mellitus with hyperglycemia: Secondary | ICD-10-CM | POA: Diagnosis not present

## 2019-12-06 ENCOUNTER — Encounter: Payer: Self-pay | Admitting: Internal Medicine

## 2019-12-14 ENCOUNTER — Encounter: Payer: Self-pay | Admitting: Internal Medicine

## 2020-01-12 ENCOUNTER — Other Ambulatory Visit: Payer: Self-pay | Admitting: Gastroenterology

## 2020-02-11 DIAGNOSIS — E1165 Type 2 diabetes mellitus with hyperglycemia: Secondary | ICD-10-CM | POA: Diagnosis not present

## 2020-02-11 DIAGNOSIS — E7849 Other hyperlipidemia: Secondary | ICD-10-CM | POA: Diagnosis not present

## 2020-02-11 DIAGNOSIS — I10 Essential (primary) hypertension: Secondary | ICD-10-CM | POA: Diagnosis not present

## 2020-02-29 ENCOUNTER — Ambulatory Visit: Payer: PPO

## 2020-03-13 DIAGNOSIS — Z024 Encounter for examination for driving license: Secondary | ICD-10-CM | POA: Diagnosis not present

## 2020-03-13 DIAGNOSIS — I10 Essential (primary) hypertension: Secondary | ICD-10-CM | POA: Diagnosis not present

## 2020-03-13 DIAGNOSIS — Z1389 Encounter for screening for other disorder: Secondary | ICD-10-CM | POA: Diagnosis not present

## 2020-03-13 DIAGNOSIS — E663 Overweight: Secondary | ICD-10-CM | POA: Diagnosis not present

## 2020-03-13 DIAGNOSIS — Z0001 Encounter for general adult medical examination with abnormal findings: Secondary | ICD-10-CM | POA: Diagnosis not present

## 2020-03-13 DIAGNOSIS — Z6827 Body mass index (BMI) 27.0-27.9, adult: Secondary | ICD-10-CM | POA: Diagnosis not present

## 2020-03-13 DIAGNOSIS — Z7689 Persons encountering health services in other specified circumstances: Secondary | ICD-10-CM | POA: Diagnosis not present

## 2020-03-13 DIAGNOSIS — E7849 Other hyperlipidemia: Secondary | ICD-10-CM | POA: Diagnosis not present

## 2020-03-13 DIAGNOSIS — E1140 Type 2 diabetes mellitus with diabetic neuropathy, unspecified: Secondary | ICD-10-CM | POA: Diagnosis not present

## 2020-04-05 ENCOUNTER — Ambulatory Visit: Payer: PPO

## 2020-04-13 DIAGNOSIS — I10 Essential (primary) hypertension: Secondary | ICD-10-CM | POA: Diagnosis not present

## 2020-04-13 DIAGNOSIS — E7849 Other hyperlipidemia: Secondary | ICD-10-CM | POA: Diagnosis not present

## 2020-04-13 DIAGNOSIS — E1165 Type 2 diabetes mellitus with hyperglycemia: Secondary | ICD-10-CM | POA: Diagnosis not present

## 2020-05-24 ENCOUNTER — Other Ambulatory Visit: Payer: Self-pay

## 2020-05-24 ENCOUNTER — Ambulatory Visit (INDEPENDENT_AMBULATORY_CARE_PROVIDER_SITE_OTHER): Payer: Self-pay | Admitting: *Deleted

## 2020-05-24 VITALS — Ht 72.0 in | Wt 211.0 lb

## 2020-05-24 DIAGNOSIS — Z86010 Personal history of colonic polyps: Secondary | ICD-10-CM

## 2020-05-24 MED ORDER — NA SULFATE-K SULFATE-MG SULF 17.5-3.13-1.6 GM/177ML PO SOLN: 1.0000 | Freq: Once | ORAL | 0 refills | Status: AC

## 2020-05-24 NOTE — Patient Instructions (Signed)
Stephen Stephenson  1950/10/18 MRN: [id number]     Procedure Date: 07/05/2020 Time to register: 11:00 am Place to register: [facility] Procedure Time: 12:00 pm Scheduled provider: Dr. Gala Romney    PREPARATION FOR COLONOSCOPY WITH SUPREP BOWEL PREP KIT  Note: Suprep Bowel Prep Kit is a split-dose (2day) regimen. Consumption of BOTH 6-ounce bottles is required for a complete prep.  Please notify us immediately if you are diabetic, take iron supplements, or if you are on Coumadin or any other blood thinners.  Please hold the following medications: See letter                                                                                                                                                  2 DAYS BEFORE PROCEDURE:  DATE: 07/03/2020   DAY: Monday Begin clear liquid diet AFTER your lunch meal. NO SOLID FOODS after this point.  1 DAY BEFORE PROCEDURE:  DATE: 07/04/2020   DAY: Tuesday Continue clear liquids the entire day - NO SOLID FOOD.   Diabetic medications adjustments for today: See letter  At 6:00pm: Complete steps 1 through 4 below, using ONE (1) 6-ounce bottle, before going to bed. Step 1:  Pour ONE (1) 6-ounce bottle of SUPREP liquid into the mixing container.  Step 2:  Add cool drinking water to the 16 ounce line on the container and mix.  Note: Dilute the solution concentrate as directed prior to use. Step 3:  DRINK ALL the liquid in the container. Step 4:  You MUST drink an additional two (2) or more 16 ounce containers of water over the next one (1) hour.   Continue clear liquids.  DAY OF PROCEDURE:   DATE: 07/05/2020   DAY: Wednesday If you take medications for your heart, blood pressure, or breathing, you may take these medications.  Diabetic medications adjustments for today: See letter  5 hours before your procedure at :  7:00 am Step 1:  Pour ONE (1) 6-ounce bottle of SUPREP liquid into the mixing container.  Step 2:  Add cool drinking water to the  16 ounce line on the container and mix.  Note: Dilute the solution concentrate as directed prior to use. Step 3:  DRINK ALL the liquid in the container. Step 4:  You MUST drink an additional two (2) or more 16 ounce containers of water over the next one (1) hour. You MUST complete the final glass of water at least 3 hours before your colonoscopy. Nothing by mouth past 9:00 am.  You may take your morning medications with sip of water unless we have instructed otherwise.    Please see below for Dietary Information.  CLEAR LIQUIDS INCLUDE:  Water Jello (NOT red in color)   Ice Popsicles (NOT red in color)   Tea (sugar ok, no milk/cream) Powdered fruit flavored drinks  Coffee (sugar ok, no milk/cream) Gatorade/ Lemonade/ Kool-Aid  (NOT red in color)   Juice: apple, white grape, white cranberry Soft drinks  Clear bullion, consomme, broth (fat free beef/chicken/vegetable)  Carbonated beverages (any kind)  Strained chicken noodle soup Hard Candy   Remember: Clear liquids are liquids that will allow you to see your fingers on the other side of a clear glass. Be sure liquids are NOT red in color, and not cloudy, but CLEAR.  DO NOT EAT OR DRINK ANY OF THE FOLLOWING:  Dairy products of any kind   Cranberry juice Tomato juice / V8 juice   Grapefruit juice Orange juice     Red grape juice  Do not eat any solid foods, including such foods as: cereal, oatmeal, yogurt, fruits, vegetables, creamed soups, eggs, bread, crackers, pureed foods in a blender, etc.   HELPFUL HINTS FOR DRINKING PREP SOLUTION:   Make sure prep is extremely cold. Mix and refrigerate the the morning of the prep. You may also put in the freezer.   You may try mixing some Crystal Light or Country Time Lemonade if you prefer. Mix in small amounts; add more if necessary.  Try drinking through a straw  Rinse mouth with water or a mouthwash between glasses, to remove after-taste.  Try sipping on a cold beverage /ice/ popsicles  between glasses of prep.  Place a piece of sugar-free hard candy in mouth between glasses.  If you become nauseated, try consuming smaller amounts, or stretch out the time between glasses. Stop for 30-60 minutes, then slowly start back drinking.     OTHER INSTRUCTIONS  You will need a responsible adult at least 70 years of age to accompany you and drive you home. This person must remain in the waiting room during your procedure. The hospital will cancel your procedure if you do not have a responsible adult with you.   1. Wear loose fitting clothing that is easily removed. 2. Leave jewelry and other valuables at home.  3. Remove all body piercing jewelry and leave at home. 4. Total time from sign-in until discharge is approximately 2-3 hours. 5. You should go home directly after your procedure and rest. You can resume normal activities the day after your procedure. 6. The day of your procedure you should not:  Drive  Make legal decisions  Operate machinery  Drink alcohol  Return to work   You may call the office (Dept: [phone]) before 5:00pm, or page the doctor on call [phone]) after 5:00pm, for further instructions, if necessary.   Insurance Information YOU WILL NEED TO CHECK WITH YOUR INSURANCE COMPANY FOR THE BENEFITS OF COVERAGE YOU HAVE FOR THIS PROCEDURE.  UNFORTUNATELY, NOT ALL INSURANCE COMPANIES HAVE BENEFITS TO COVER ALL OR PART OF THESE TYPES OF PROCEDURES.  IT IS YOUR RESPONSIBILITY TO CHECK YOUR BENEFITS, HOWEVER, WE WILL BE GLAD TO ASSIST YOU WITH ANY CODES YOUR INSURANCE COMPANY MAY NEED.    PLEASE NOTE THAT MOST INSURANCE COMPANIES WILL NOT COVER A SCREENING COLONOSCOPY FOR PEOPLE UNDER THE AGE OF 50  IF YOU HAVE BCBS INSURANCE, YOU MAY HAVE BENEFITS FOR A SCREENING COLONOSCOPY BUT IF POLYPS ARE FOUND THE DIAGNOSIS WILL CHANGE AND THEN YOU MAY HAVE A DEDUCTIBLE THAT WILL NEED TO BE MET. SO PLEASE MAKE SURE YOU CHECK YOUR BENEFITS FOR A SCREENING  COLONOSCOPY AS WELL AS A DIAGNOSTIC COLONOSCOPY.

## 2020-05-24 NOTE — Progress Notes (Signed)
Gastroenterology Pre-Procedure Review  Request Date: 05/24/2020 Requesting Physician: 5 year recall, Last TCS 01/20/2015 by Dr. Gala Romney, colon polyp, hx of tubular adenoma 2013  PATIENT REVIEW QUESTIONS: The patient responded to the following health history questions as indicated:    1. Diabetes Melitis: yes, type II 2. Joint replacements in the past 12 months: no 3. Major health problems in the past 3 months: no 4. Has an artificial valve or MVP: no 5. Has a defibrillator: no 6. Has been advised in past to take antibiotics in advance of a procedure like teeth cleaning: no 7. Family history of colon cancer:  no 8. Alcohol Use: no 9. Illicit drug Use: no 10. History of sleep apnea: no 11. History of coronary artery or other vascular stents placed within the last 12 months: no 12. History of any prior anesthesia complications: no 13. Body mass index is 28.62 kg/m.    MEDICATIONS & ALLERGIES:    Patient reports the following regarding taking any blood thinners:   Plavix? no Aspirin? no Coumadin? no Brilinta? no Xarelto? no Eliquis? no Pradaxa? no Savaysa? no Effient? no  Patient confirms/reports the following medications:  Current Outpatient Medications  Medication Sig Dispense Refill  . cholecalciferol (VITAMIN D) 1000 units tablet Take 1,000 Units by mouth daily.    [name] glimepiride (AMARYL) 4 MG tablet Take 4 mg by mouth 2 (two) times daily.    [name] omeprazole (PRILOSEC) 40 MG capsule Take 1 capsule by mouth once daily 90 capsule 0  . potassium citrate (UROCIT-K) 10 MEQ (1080 MG) SR tablet Take 10 mEq by mouth 2 (two) times daily.      No current facility-administered medications for this visit.    Patient confirms/reports the following allergies:  No Known Allergies  No orders of the defined types were placed in this encounter.   AUTHORIZATION INFORMATION Primary Insurance: [employer],  ID #: [id number],  Group #: [id number] Pre-Cert / Auth required: Pre-Cert /  Auth #:  SCHEDULE INFORMATION: Procedure has been scheduled as follows:  Date: 07/05/2020, Time: 12:00 Location: [facility] with Dr. Gala Romney  This Gastroenterology Pre-Precedure Review Form is being routed to the following provider(s): Walden Field, NP

## 2020-05-30 NOTE — Progress Notes (Signed)
Ok to schedule.  glimepiride: half night before and none morning of.  On prep day: Check CBG ac and hs as well (if they normally check their blood sugar) as if the patient feels like their blood sugar is off. Can use soda, juice (that's in [place]) as needed for any low blood sugar.  Check CBG on arrival to endo unit.

## 2020-05-31 NOTE — Progress Notes (Signed)
ASA II, thanks

## 2020-06-01 ENCOUNTER — Encounter: Payer: Self-pay | Admitting: *Deleted

## 2020-06-01 NOTE — Progress Notes (Signed)
Mailed letter to pt with diabetes medication adjustments.

## 2020-06-05 DIAGNOSIS — N200 Calculus of kidney: Secondary | ICD-10-CM | POA: Diagnosis not present

## 2020-06-13 DIAGNOSIS — E1165 Type 2 diabetes mellitus with hyperglycemia: Secondary | ICD-10-CM | POA: Diagnosis not present

## 2020-06-13 DIAGNOSIS — E7849 Other hyperlipidemia: Secondary | ICD-10-CM | POA: Diagnosis not present

## 2020-06-13 DIAGNOSIS — I10 Essential (primary) hypertension: Secondary | ICD-10-CM | POA: Diagnosis not present

## 2020-07-03 ENCOUNTER — Other Ambulatory Visit (HOSPITAL_COMMUNITY)
Admission: RE | Admit: 2020-07-03 | Discharge: 2020-07-03 | Disposition: A | Discharge status: Home / Self Care | Payer: PPO | Source: Ambulatory Visit | Attending: Internal Medicine | Admitting: Internal Medicine

## 2020-07-03 ENCOUNTER — Other Ambulatory Visit: Payer: Self-pay

## 2020-07-03 DIAGNOSIS — Z20822 Contact with and (suspected) exposure to covid-19: Secondary | ICD-10-CM | POA: Insufficient documentation

## 2020-07-03 DIAGNOSIS — Z01812 Encounter for preprocedural laboratory examination: Secondary | ICD-10-CM | POA: Insufficient documentation

## 2020-07-04 LAB — SARS CORONAVIRUS 2 (TAT 6-24 HRS): SARS Coronavirus 2: NEGATIVE

## 2020-07-05 ENCOUNTER — Ambulatory Visit (HOSPITAL_COMMUNITY)
Admission: RE | Admit: 2020-07-05 | Discharge: 2020-07-05 | Disposition: A | Discharge status: Home / Self Care | Payer: PPO | Attending: Internal Medicine | Admitting: Internal Medicine

## 2020-07-05 ENCOUNTER — Other Ambulatory Visit: Payer: Self-pay

## 2020-07-05 ENCOUNTER — Encounter (HOSPITAL_COMMUNITY)
Admission: RE | Disposition: A | Discharge status: Home / Self Care | Payer: Self-pay | Source: Home / Self Care | Attending: Internal Medicine

## 2020-07-05 ENCOUNTER — Encounter (HOSPITAL_COMMUNITY): Payer: Self-pay | Admitting: Internal Medicine

## 2020-07-05 DIAGNOSIS — K589 Irritable bowel syndrome without diarrhea: Secondary | ICD-10-CM | POA: Diagnosis not present

## 2020-07-05 DIAGNOSIS — Z79899 Other long term (current) drug therapy: Secondary | ICD-10-CM | POA: Diagnosis not present

## 2020-07-05 DIAGNOSIS — Z8379 Family history of other diseases of the digestive system: Secondary | ICD-10-CM | POA: Insufficient documentation

## 2020-07-05 DIAGNOSIS — K219 Gastro-esophageal reflux disease without esophagitis: Secondary | ICD-10-CM | POA: Insufficient documentation

## 2020-07-05 DIAGNOSIS — Z1211 Encounter for screening for malignant neoplasm of colon: Secondary | ICD-10-CM | POA: Insufficient documentation

## 2020-07-05 DIAGNOSIS — Z86010 Personal history of colonic polyps: Secondary | ICD-10-CM | POA: Diagnosis not present

## 2020-07-05 DIAGNOSIS — Z7984 Long term (current) use of oral hypoglycemic drugs: Secondary | ICD-10-CM | POA: Diagnosis not present

## 2020-07-05 DIAGNOSIS — K5520 Angiodysplasia of colon without hemorrhage: Secondary | ICD-10-CM | POA: Diagnosis not present

## 2020-07-05 DIAGNOSIS — E119 Type 2 diabetes mellitus without complications: Secondary | ICD-10-CM | POA: Diagnosis not present

## 2020-07-05 DIAGNOSIS — K635 Polyp of colon: Secondary | ICD-10-CM

## 2020-07-05 DIAGNOSIS — K7581 Nonalcoholic steatohepatitis (NASH): Secondary | ICD-10-CM | POA: Diagnosis not present

## 2020-07-05 HISTORY — PX: POLYPECTOMY: SHX5525

## 2020-07-05 HISTORY — PX: COLONOSCOPY: SHX5424

## 2020-07-05 LAB — GLUCOSE, CAPILLARY: Glucose-Capillary: 191 mg/dL — ABNORMAL HIGH (ref 70–99)

## 2020-07-05 SURGERY — COLONOSCOPY
Anesthesia: Moderate Sedation

## 2020-07-05 MED ORDER — SODIUM CHLORIDE 0.9 % IV SOLN: INTRAVENOUS | Status: DC

## 2020-07-05 MED ORDER — MEPERIDINE HCL 50 MG/ML IJ SOLN
INTRAMUSCULAR | Status: AC
  Filled 2020-07-05: qty 1

## 2020-07-05 MED ORDER — MIDAZOLAM HCL 5 MG/5ML IJ SOLN
INTRAMUSCULAR | Status: AC
  Filled 2020-07-05: qty 10

## 2020-07-05 MED ORDER — ONDANSETRON HCL 4 MG/2ML IJ SOLN
INTRAMUSCULAR | Status: DC | PRN
  Administered 2020-07-05: 4 mg via INTRAVENOUS

## 2020-07-05 MED ORDER — MIDAZOLAM HCL 5 MG/5ML IJ SOLN
INTRAMUSCULAR | Status: DC | PRN
  Administered 2020-07-05: 2 mg via INTRAVENOUS
  Administered 2020-07-05 (×2): 1 mg via INTRAVENOUS

## 2020-07-05 MED ORDER — ONDANSETRON HCL 4 MG/2ML IJ SOLN
INTRAMUSCULAR | Status: AC
  Filled 2020-07-05: qty 2

## 2020-07-05 MED ORDER — MEPERIDINE HCL 100 MG/ML IJ SOLN
INTRAMUSCULAR | Status: DC | PRN
  Administered 2020-07-05: 40 mg via INTRAVENOUS

## 2020-07-05 MED ORDER — STERILE WATER FOR IRRIGATION IR SOLN
Status: DC | PRN
  Administered 2020-07-05: 11:00:00 500 mL

## 2020-07-05 NOTE — Discharge Instructions (Signed)
Colonoscopy Discharge Instructions  Read the instructions outlined below and refer to this sheet in the next few weeks. These discharge instructions provide you with general information on caring for yourself after you leave the hospital. Your doctor may also give you specific instructions. While your treatment has been planned according to the most current medical practices available, unavoidable complications occasionally occur. If you have any problems or questions after discharge, call Dr. Gala Romney at [phone]. ACTIVITY  You may resume your regular activity, but move at a slower pace for the next 24 hours.   Take frequent rest periods for the next 24 hours.   Walking will help get rid of the air and reduce the bloated feeling in your belly (abdomen).   No driving for 24 hours (because of the medicine (anesthesia) used during the test).    Do not sign any important legal documents or operate any machinery for 24 hours (because of the anesthesia used during the test).  NUTRITION  Drink plenty of fluids.   You may resume your normal diet as instructed by your doctor.   Begin with a light meal and progress to your normal diet. Heavy or fried foods are harder to digest and may make you feel sick to your stomach (nauseated).   Avoid alcoholic beverages for 24 hours or as instructed.  MEDICATIONS  You may resume your normal medications unless your doctor tells you otherwise.  WHAT YOU CAN EXPECT TODAY  Some feelings of bloating in the abdomen.   Passage of more gas than usual.   Spotting of blood in your stool or on the toilet paper.  IF YOU HAD POLYPS REMOVED DURING THE COLONOSCOPY:  No aspirin products for 7 days or as instructed.   No alcohol for 7 days or as instructed.   Eat a soft diet for the next 24 hours.  FINDING OUT THE RESULTS OF YOUR TEST Not all test results are available during your visit. If your test results are not back during the visit, make an appointment  with your caregiver to find out the results. Do not assume everything is normal if you have not heard from your caregiver or the medical facility. It is important for you to follow up on all of your test results.  SEEK IMMEDIATE MEDICAL ATTENTION IF:  You have more than a spotting of blood in your stool.   Your belly is swollen (abdominal distention).   You are nauseated or vomiting.   You have a temperature over 101.   You have abdominal pain or discomfort that is severe or gets worse throughout the day.    1 small polyp removed from your colon  Further recommendations to follow pending review of pathology report  At patient request, I called spouse at [phone] -reviewed results  Office visit with [place] to check up on your liver in March or April 2022   Colon Polyps  Polyps are tissue growths inside the body. Polyps can grow in many places, including the large intestine (colon). A polyp may be a round bump or a mushroom-shaped growth. You could have one polyp or several. Most colon polyps are noncancerous (benign). However, some colon polyps can become cancerous over time. Finding and removing the polyps early can help prevent this. What are the causes? The exact cause of colon polyps is not known. What increases the risk? You are more likely to develop this condition if you:  Have a family history of colon cancer or colon polyps.  Are  older than 57 or older than 89 if you are African American.  Have inflammatory bowel disease, such as ulcerative colitis or Crohn's disease.  Have certain hereditary conditions, such as: ? Familial adenomatous polyposis. ? Lynch syndrome. ? Turcot syndrome. ? Peutz-Jeghers syndrome.  Are overweight.  Smoke cigarettes.  Do not get enough exercise.  Drink too much alcohol.  Eat a diet that is high in fat and red meat and low in fiber.  Had childhood cancer that was treated with abdominal radiation. What are the signs or  symptoms? Most polyps do not cause symptoms. If you have symptoms, they may include:  Blood coming from your rectum when having a bowel movement.  Blood in your stool. The stool may look dark red or black.  Abdominal pain.  A change in bowel habits, such as constipation or diarrhea. How is this diagnosed? This condition is diagnosed with a colonoscopy. This is a procedure in which a lighted, flexible scope is inserted into the anus and then passed into the colon to examine the area. Polyps are sometimes found when a colonoscopy is done as part of routine cancer screening tests. How is this treated? Treatment for this condition involves removing any polyps that are found. Most polyps can be removed during a colonoscopy. Those polyps will then be tested for cancer. Additional treatment may be needed depending on the results of testing. Follow these instructions at home: Lifestyle  Maintain a healthy weight, or lose weight if recommended by your health care provider.  Exercise every day or as told by your health care provider.  Do not use any products that contain nicotine or tobacco, such as cigarettes and e-cigarettes. If you need help quitting, ask your health care provider.  If you drink alcohol, limit how much you have: ? 0-1 drink a day for women. ? 0-2 drinks a day for men.  Be aware of how much alcohol is in your drink. In the [place] one drink equals one 12 oz bottle of beer (355 mL), one 5 oz glass of wine (148 mL), or one 1 oz shot of hard liquor (44 mL). Eating and drinking   Eat foods that are high in fiber, such as fruits, vegetables, and whole grains.  Eat foods that are high in calcium and vitamin D, such as milk, cheese, yogurt, eggs, liver, fish, and broccoli.  Limit foods that are high in fat, such as fried foods and desserts.  Limit the amount of red meat and processed meat you eat, such as hot dogs, sausage, bacon, and lunch meats. General instructions  Keep  all follow-up visits as told by your health care provider. This is important. ? This includes having regularly scheduled colonoscopies. ? Talk to your health care provider about when you need a colonoscopy. Contact a health care provider if:  You have new or worsening bleeding during a bowel movement.  You have new or increased blood in your stool.  You have a change in bowel habits.  You lose weight for no known reason. Summary  Polyps are tissue growths inside the body. Polyps can grow in many places, including the colon.  Most colon polyps are noncancerous (benign), but some can become cancerous over time.  This condition is diagnosed with a colonoscopy.  Treatment for this condition involves removing any polyps that are found. Most polyps can be removed during a colonoscopy. This information is not intended to replace advice given to you by your health care provider. Make sure you  discuss any questions you have with your health care provider. Document Revised: 10/16/2017 Document Reviewed: 10/16/2017 Elsevier Patient Education  Stephen Stephenson.

## 2020-07-05 NOTE — H&P (Signed)
@LOGO @   Primary Care Physician:  Sharilyn Sites, MD Primary Gastroenterologist:  Dr. Gala Romney  Pre-Procedure History & Physical: HPI:  Stephen Stephenson is a 69 y.o. male here for surveillance colonoscopy.  History of colonic adenoma removed 2013; no adenomas found 2016.  No bowel symptoms currently.  Here for surveillance colonoscopy.  Past Medical History:  Diagnosis Date  . DM (diabetes mellitus) ([place])    type II  . GERD (gastroesophageal reflux disease)   . Hb-AS genotype ([place])   . Hepatitis C    genotype 1 eradicated  . IBS (irritable bowel syndrome)   . Insomnia   . Kidney stone   . NASH (nonalcoholic steatohepatitis)    Metavir F3, 10/2013, patient reports completing hepatitis a and B vaccination. 2016    Past Surgical History:  Procedure Laterality Date  . BACK SURGERY    . CHOLECYSTECTOMY    . COLONOSCOPY  02/05/02   anal canal herrhoids ow normal/normal ileum/suspect pt bled from hemorrhoids presently  . COLONOSCOPY  09/27/2011   GYB:WLSLHTDS hemorrhoids. Hepatic flexure polyp-removed TUBULAR ADENOMA, due for surveillance March 2016  . COLONOSCOPY N/A 01/20/2015   KAJ:GOTLXB colonic polyp removed/colonic diverticulosis  . CYSTOSCOPY W/ URETERAL STENT PLACEMENT Left 02/03/2013   Procedure: CYSTOSCOPY WITH RETROGRADE PYELOGRAM/URETERAL STENT PLACEMENT;  Surgeon: Marissa Nestle, MD;  Location: [facility];  Service: Urology;  Laterality: Left;  . ESOPHAGOGASTRODUODENOSCOPY  05/17/05   normal esophagus/gastric mucosa appeared normal  . ESOPHAGOGASTRODUODENOSCOPY N/A 04/25/2014   Abnormal gastric mucosa, benign biopsies. No esophageal varices. Next EGD October 2018.  [name] ESOPHAGOGASTRODUODENOSCOPY (EGD) WITH PROPOFOL N/A 01/08/2018   Procedure: ESOPHAGOGASTRODUODENOSCOPY (EGD) WITH PROPOFOL;  Surgeon: [name], MD;  Location: [facility];  Service: Endoscopy;  Laterality: N/A;  7:30am  . EXTRACORPOREAL SHOCK WAVE LITHOTRIPSY Left 02/03/2013   Procedure: EXTRACORPOREAL SHOCK  WAVE LITHOTRIPSY (ESWL) LEFT RENAL CALCULUS;  Surgeon: Marissa Nestle, MD;  Location: [facility];  Service: Urology;  Laterality: Left;  . KIDNEY STONE SURGERY    . MALONEY DILATION N/A 01/08/2018   Procedure: Venia Minks DILATION;  Surgeon: [name], MD;  Location: [facility];  Service: Endoscopy;  Laterality: N/A;  . SHOULDER SURGERY      Prior to Admission medications   Medication Sig Start Date End Date Taking? Authorizing Provider  glimepiride (AMARYL) 4 MG tablet Take 4 mg by mouth daily with breakfast.    [provider]  JANUMET 50-1000 MG tablet Take 1 tablet by mouth 2 (two) times daily. 04/11/20   [provider]  omeprazole (PRILOSEC) 40 MG capsule Take 1 capsule by mouth once daily Patient taking differently: Take 40 mg by mouth daily. 01/18/20   [name], NP  potassium citrate (UROCIT-K) 10 MEQ (1080 MG) SR tablet Take 10 mEq by mouth 2 (two) times daily.     [provider]    Allergies as of 06/01/2020  . (No Known Allergies)    Family History  Problem Relation Age of Onset  . Liver disease Father        ? Hepatitis C; Alcoholic  . Colon cancer Neg Hx     Social History   Socioeconomic History  . Marital status: Married    Spouse name: Not on file  . Number of children: Not on file  . Years of education: Not on file  . Highest education level: Not on file  Occupational History  . Not on file  Tobacco Use  . Smoking status: Former Smoker  Packs/day: 1.00    Years: 25.00    Pack years: 25.00    Types: Cigarettes    Quit date: 08/02/1995    Years since quitting: 24.9  . Smokeless tobacco: Former Systems developer    Quit date: 10/12/1992  Vaping Use  . Vaping Use: Never used  Substance and Sexual Activity  . Alcohol use: Not Currently    Alcohol/week: 0.0 standard drinks    Comment: Occasionally; Average one drink every few months; Summer will occasionally drink a bottle of wine.  . Drug use: No  . Sexual activity: Yes    Birth  control/protection: None  Other Topics Concern  . Not on file  Social History Narrative  . Not on file   Social Determinants of Health   Financial Resource Strain: Not on file  Food Insecurity: Not on file  Transportation Needs: Not on file  Physical Activity: Not on file  Stress: Not on file  Social Connections: Not on file  Intimate Partner Violence: Not on file    Review of Systems: See HPI, otherwise negative ROS  Physical Exam: BP (!) 152/87   Temp 97.8 F (36.6 C) (Oral)   Resp 15   Ht 6' (1.829 m)   Wt 93.4 kg   SpO2 97%   BMI 27.94 kg/m  General:   Alert,  Well-developed, well-nourished, pleasant and cooperative in NAD Neck:  Supple; no masses or thyromegaly. No significant cervical adenopathy. Lungs:  Clear throughout to auscultation.   No wheezes, crackles, or rhonchi. No acute distress. Heart:  Regular rate and rhythm; no murmurs, clicks, rubs,  or gallops. Abdomen: Non-distended, normal bowel sounds.  Soft and nontender without appreciable mass or hepatosplenomegaly.  Pulses:  Normal pulses noted. Extremities:  Without clubbing or edema.  Impression/Plan: 69 year old gentleman here with a history colonic adenoma for surveillance colonoscopy per plan. The risks, benefits, limitations, alternatives and imponderables have been reviewed with the patient. Questions have been answered. All parties are agreeable.      Notice: This dictation was prepared with Dragon dictation along with smaller phrase technology. Any transcriptional errors that result from this process are unintentional and may not be corrected upon review.

## 2020-07-05 NOTE — Op Note (Signed)
[facility] Patient Name: Stephen Stephenson Procedure Date: 07/05/2020 10:15 AM MRN: [id number] Date of Birth: 11-Sep-1950 Attending MD: Norvel Richards , MD CSN: [id number] Age: 69 Admit Type: Outpatient Procedure:                Colonoscopy Indications:              High risk colon cancer surveillance: Personal                            history of colonic polyps Providers:                Norvel Richards, MD, Lurline Del, RN, Wynonia Musty Tech, Technician Referring MD:              Medicines:                Midazolam 4 mg IV, Meperidine 40 mg IV Complications:            No immediate complications. Estimated Blood Loss:     Estimated blood loss was minimal. Procedure:                Pre-Anesthesia Assessment:                           - Prior to the procedure, a History and Physical                            was performed, and patient medications and                            allergies were reviewed. The patient's tolerance of                            previous anesthesia was also reviewed. The risks                            and benefits of the procedure and the sedation                            options and risks were discussed with the patient.                            All questions were answered, and informed consent                            was obtained. Prior Anticoagulants: The patient has                            taken no previous anticoagulant or antiplatelet                            agents. ASA Grade Assessment: II - A patient with  mild systemic disease. After reviewing the risks                            and benefits, the patient was deemed in                            satisfactory condition to undergo the procedure.                           After obtaining informed consent, the colonoscope                            was passed under direct vision. Throughout the                             procedure, the patient's blood pressure, pulse, and                            oxygen saturations were monitored continuously. The                            CF-[id number] ([id number]) scope was introduced through                            the anus and advanced to the the cecum, identified                            by appendiceal orifice and ileocecal valve. The                            colonoscopy was performed without difficulty. The                            patient tolerated the procedure well. The quality                            of the bowel preparation was adequate. Scope In: 10:46:38 AM Scope Out: 11:00:58 AM Scope Withdrawal Time: 0 hours 11 minutes 9 seconds  Total Procedure Duration: 0 hours 14 minutes 20 seconds  Findings:      The perianal and digital rectal examinations were normal.      A 3 mm polyp was found in the recto-sigmoid colon. The polyp was       semi-pedunculated. The polyp was removed with a cold snare. Resection       and retrieval were complete. Estimated blood loss was minimal. 7 mm       cecal AVM.      The exam was otherwise without abnormality on direct and retroflexion       views. Impression:               - One 3 mm polyp at the recto-sigmoid colon,                            removed with a cold snare. Resected and retrieved.  7 mm innocent appearing AVM in the base the cecum.                           - The examination was otherwise normal on direct                            and retroflexion views. Moderate Sedation:      Moderate (conscious) sedation was personally administered by an       anesthesia professional. The following parameters were monitored: oxygen       saturation, heart rate, blood pressure, respiratory rate, EKG, adequacy       of pulmonary ventilation, and response to care. Total physician       intraservice time was 19 minutes. Recommendation:           - Patient has a contact number available for                             emergencies. The signs and symptoms of potential                            delayed complications were discussed with the                            patient. Return to normal activities tomorrow.                            Written discharge instructions were provided to the                            patient.                           - Resume previous diet.                           - Continue present medications.                           - Repeat colonoscopy date to be determined after                            pending pathology results are reviewed for                            surveillance.                           - Return to GI office in 3 months. Procedure Code(s):        --- Professional ---                           [phone], Colonoscopy, flexible; with removal of                            tumor(s), polyp(s), or other lesion(s) by snare  technique Diagnosis Code(s):        --- Professional ---                           Z86.010, Personal history of colonic polyps                           K63.5, Polyp of colon CPT copyright 2019 American Medical Association. All rights reserved. The codes documented in this report are preliminary and upon coder review may  be revised to meet current compliance requirements. Cristopher Estimable. Jacob Chamblee, MD Norvel Richards, MD 07/05/2020 11:10:27 AM This report has been signed electronically. Number of Addenda: 0

## 2020-07-06 LAB — SURGICAL PATHOLOGY

## 2020-07-10 ENCOUNTER — Encounter: Payer: Self-pay | Admitting: Internal Medicine

## 2020-07-11 ENCOUNTER — Encounter: Payer: Self-pay | Admitting: Internal Medicine

## 2020-07-13 ENCOUNTER — Encounter (HOSPITAL_COMMUNITY): Payer: Self-pay | Admitting: Internal Medicine

## 2020-07-15 DIAGNOSIS — Z86711 Personal history of pulmonary embolism: Secondary | ICD-10-CM

## 2020-07-15 HISTORY — DX: Personal history of pulmonary embolism: Z86.711

## 2020-08-01 DIAGNOSIS — L309 Dermatitis, unspecified: Secondary | ICD-10-CM | POA: Diagnosis not present

## 2020-08-01 DIAGNOSIS — E663 Overweight: Secondary | ICD-10-CM | POA: Diagnosis not present

## 2020-08-01 DIAGNOSIS — Z6827 Body mass index (BMI) 27.0-27.9, adult: Secondary | ICD-10-CM | POA: Diagnosis not present

## 2020-08-08 DIAGNOSIS — I10 Essential (primary) hypertension: Secondary | ICD-10-CM | POA: Diagnosis not present

## 2020-08-08 DIAGNOSIS — E663 Overweight: Secondary | ICD-10-CM | POA: Diagnosis not present

## 2020-08-08 DIAGNOSIS — E7849 Other hyperlipidemia: Secondary | ICD-10-CM | POA: Diagnosis not present

## 2020-08-08 DIAGNOSIS — B182 Chronic viral hepatitis C: Secondary | ICD-10-CM | POA: Diagnosis not present

## 2020-08-08 DIAGNOSIS — E1140 Type 2 diabetes mellitus with diabetic neuropathy, unspecified: Secondary | ICD-10-CM | POA: Diagnosis not present

## 2020-08-08 DIAGNOSIS — Z6827 Body mass index (BMI) 27.0-27.9, adult: Secondary | ICD-10-CM | POA: Diagnosis not present

## 2020-08-08 DIAGNOSIS — E118 Type 2 diabetes mellitus with unspecified complications: Secondary | ICD-10-CM | POA: Diagnosis not present

## 2020-08-12 DIAGNOSIS — I10 Essential (primary) hypertension: Secondary | ICD-10-CM | POA: Diagnosis not present

## 2020-08-12 DIAGNOSIS — E1165 Type 2 diabetes mellitus with hyperglycemia: Secondary | ICD-10-CM | POA: Diagnosis not present

## 2020-08-12 DIAGNOSIS — E7849 Other hyperlipidemia: Secondary | ICD-10-CM | POA: Diagnosis not present

## 2020-08-23 DIAGNOSIS — H40013 Open angle with borderline findings, low risk, bilateral: Secondary | ICD-10-CM | POA: Diagnosis not present

## 2020-08-23 DIAGNOSIS — H25813 Combined forms of age-related cataract, bilateral: Secondary | ICD-10-CM | POA: Diagnosis not present

## 2020-08-23 DIAGNOSIS — H04123 Dry eye syndrome of bilateral lacrimal glands: Secondary | ICD-10-CM | POA: Diagnosis not present

## 2020-08-23 DIAGNOSIS — E119 Type 2 diabetes mellitus without complications: Secondary | ICD-10-CM | POA: Diagnosis not present

## 2020-08-23 DIAGNOSIS — H0102A Squamous blepharitis right eye, upper and lower eyelids: Secondary | ICD-10-CM | POA: Diagnosis not present

## 2020-08-23 DIAGNOSIS — H0102B Squamous blepharitis left eye, upper and lower eyelids: Secondary | ICD-10-CM | POA: Diagnosis not present

## 2020-08-23 DIAGNOSIS — H43393 Other vitreous opacities, bilateral: Secondary | ICD-10-CM | POA: Diagnosis not present

## 2020-09-27 DIAGNOSIS — H43393 Other vitreous opacities, bilateral: Secondary | ICD-10-CM | POA: Diagnosis not present

## 2020-09-27 DIAGNOSIS — H04123 Dry eye syndrome of bilateral lacrimal glands: Secondary | ICD-10-CM | POA: Diagnosis not present

## 2020-09-27 DIAGNOSIS — E119 Type 2 diabetes mellitus without complications: Secondary | ICD-10-CM | POA: Diagnosis not present

## 2020-09-27 DIAGNOSIS — H40013 Open angle with borderline findings, low risk, bilateral: Secondary | ICD-10-CM | POA: Diagnosis not present

## 2020-09-27 DIAGNOSIS — H0102A Squamous blepharitis right eye, upper and lower eyelids: Secondary | ICD-10-CM | POA: Diagnosis not present

## 2020-09-27 DIAGNOSIS — H0102B Squamous blepharitis left eye, upper and lower eyelids: Secondary | ICD-10-CM | POA: Diagnosis not present

## 2020-09-27 DIAGNOSIS — H25813 Combined forms of age-related cataract, bilateral: Secondary | ICD-10-CM | POA: Diagnosis not present

## 2020-09-27 DIAGNOSIS — H01006 Unspecified blepharitis left eye, unspecified eyelid: Secondary | ICD-10-CM | POA: Diagnosis not present

## 2020-10-05 ENCOUNTER — Encounter: Payer: Self-pay | Admitting: Nurse Practitioner

## 2020-10-05 ENCOUNTER — Other Ambulatory Visit: Payer: Self-pay

## 2020-10-05 ENCOUNTER — Ambulatory Visit: Payer: PPO | Admitting: Nurse Practitioner

## 2020-10-05 VITALS — BP 124/76 | HR 86 | Temp 97.1°F | Ht 73.0 in | Wt 209.0 lb

## 2020-10-05 DIAGNOSIS — R1030 Lower abdominal pain, unspecified: Secondary | ICD-10-CM | POA: Diagnosis not present

## 2020-10-05 DIAGNOSIS — Z8619 Personal history of other infectious and parasitic diseases: Secondary | ICD-10-CM | POA: Diagnosis not present

## 2020-10-05 DIAGNOSIS — K7460 Unspecified cirrhosis of liver: Secondary | ICD-10-CM | POA: Diagnosis not present

## 2020-10-05 DIAGNOSIS — K219 Gastro-esophageal reflux disease without esophagitis: Secondary | ICD-10-CM | POA: Diagnosis not present

## 2020-10-05 DIAGNOSIS — K760 Fatty (change of) liver, not elsewhere classified: Secondary | ICD-10-CM

## 2020-10-05 MED ORDER — PANTOPRAZOLE SODIUM 40 MG PO TBEC
40.0000 mg | DELAYED_RELEASE_TABLET | Freq: Every day | ORAL | 3 refills | Status: DC
Start: 2020-10-05 — End: 2021-03-02

## 2020-10-05 NOTE — Patient Instructions (Addendum)
Your health issues we discussed today were:   GERD (reflux/heartburn/indigestion): 1. I sent a prescription for pantoprazole (Protonix) 40 mg.  Take this once a day, every day 2. Try to avoid triggers that cause a worsening of your symptoms.  I printed a list below with typical triggers for most people 3. Avoid taking any NSAIDs (ibuprofen, Motrin, Advil, Aleve, naproxen, Naprosyn, aspirin, Goody powders, BC powders, anything with "NSAID" on the bottle). 4. You can use Tums, Rolaids, or similar over-the-counter medication for any flares despite daily Protonix 5. Call us for any worsening or severe symptoms  History of hepatitis C, fatty liver disease, possible early cirrhosis: 1. Because your CT scan showed possible early cirrhosis we will do further evaluation 2. Have your labs completed when you are able to 3. We will help schedule the special ultrasound your liver to check for the amount of scarring 4. Call us for any concerning symptoms in the meantime  Overall I recommend:  1. Continue other current medications 2. Return for follow-up in 3 months 3. Call us for any questions or concerns   ---------------------------------------------------------------  I am glad you have gotten your COVID-19 vaccination!  Even though you are fully vaccinated you should continue to follow CDC and state/local guidelines.  ---------------------------------------------------------------   At [facility] we value your feedback. You may receive a survey about your visit today. Please share your experience as we strive to create trusting relationships with our patients to provide genuine, compassionate, quality care.  We appreciate your understanding and patience as we review any laboratory studies, imaging, and other diagnostic tests that are ordered as we care for you. Our office policy is 5 business days for review of these results, and any emergent or urgent results are addressed in a  timely manner for your best interest. If you do not hear from our office in 1 week, please contact us.   We also encourage the use of MyChart, which contains your medical information for your review as well. If you are not enrolled in this feature, an access code is on this after visit summary for your convenience. Thank you for allowing [place] to be involved in your care.  It was great to see you today!  I hope you have a great spring!!      Food Choices for Gastroesophageal Reflux Disease, Adult When you have gastroesophageal reflux disease (GERD), the foods you eat and your eating habits are very important. Choosing the right foods can help ease the discomfort of GERD. Consider working with a dietitian to help you make healthy food choices. What are tips for following this plan? Reading food labels  Look for foods that are low in saturated fat. Foods that have less than 5% of daily value (DV) of fat and 0 g of trans fats may help with your symptoms. Cooking  Cook foods using methods other than frying. This may include baking, steaming, grilling, or broiling. These are all methods that do not need a lot of fat for cooking.  To add flavor, try to use herbs that are low in spice and acidity. Meal planning  Choose healthy foods that are low in fat, such as fruits, vegetables, whole grains, low-fat dairy products, lean meats, fish, and poultry.  Eat frequent, small meals instead of three large meals each day. Eat your meals slowly, in a relaxed setting. Avoid bending over or lying down until 2-3 hours after eating.  Limit high-fat foods such as fatty meats or fried foods.  Limit your intake of fatty foods, such as oils, butter, and shortening.  Avoid the following as told by your health care provider: ? Foods that cause symptoms. These may be different for different people. Keep a food diary to keep track of foods that cause symptoms. ? Alcohol. ? Drinking large amounts of liquid with  meals. ? Eating meals during the 2-3 hours before bed.   Lifestyle  Maintain a healthy weight. Ask your health care provider what weight is healthy for you. If you need to lose weight, work with your health care provider to do so safely.  Exercise for at least 30 minutes on 5 or more days each week, or as told by your health care provider.  Avoid wearing clothes that fit tightly around your waist and chest.  Do not use any products that contain nicotine or tobacco. These products include cigarettes, chewing tobacco, and vaping devices, such as e-cigarettes. If you need help quitting, ask your health care provider.  Sleep with the head of your bed raised. Use a wedge under the mattress or blocks under the bed frame to raise the head of the bed.  Chew sugar-free gum after mealtimes. What foods should I eat? Eat a healthy, well-balanced diet of fruits, vegetables, whole grains, low-fat dairy products, lean meats, fish, and poultry. Each person is different. Foods that may trigger symptoms in one person may not trigger any symptoms in another person. Work with your health care provider to identify foods that are safe for you. The items listed above may not be a complete list of recommended foods and beverages. Contact a dietitian for more information.   What foods should I avoid? Limiting some of these foods may help manage the symptoms of GERD. Everyone is different. Consult a dietitian or your health care provider to help you identify the exact foods to avoid, if any. Fruits Any fruits prepared with added fat. Any fruits that cause symptoms. For some people this may include citrus fruits, such as oranges, grapefruit, pineapple, and lemons. Vegetables Deep-fried vegetables. [place] fries. Any vegetables prepared with added fat. Any vegetables that cause symptoms. For some people, this may include tomatoes and tomato products, chili peppers, onions and garlic, and horseradish. Grains Pastries or  quick breads with added fat. Meats and other proteins High-fat meats, such as fatty beef or pork, hot dogs, ribs, ham, sausage, salami, and bacon. Fried meat or protein, including fried fish and fried chicken. Nuts and nut butters, in large amounts. Dairy Whole milk and chocolate milk. Sour cream. Cream. Ice cream. Cream cheese. Milkshakes. Fats and oils Butter. Margarine. Shortening. Ghee. Beverages Coffee and tea, with or without caffeine. Carbonated beverages. Sodas. Energy drinks. Fruit juice made with acidic fruits, such as orange or grapefruit. Tomato juice. Alcoholic drinks. Sweets and desserts Chocolate and cocoa. Donuts. Seasonings and condiments Pepper. Peppermint and spearmint. Added salt. Any condiments, herbs, or seasonings that cause symptoms. For some people, this may include curry, hot sauce, or vinegar-based salad dressings. The items listed above may not be a complete list of foods and beverages to avoid. Contact a dietitian for more information. Questions to ask your health care provider Diet and lifestyle changes are usually the first steps that are taken to manage symptoms of GERD. If diet and lifestyle changes do not improve your symptoms, talk with your health care provider about taking medicines. Where to find more information  International Foundation for Gastrointestinal Disorders: aboutgerd.org Summary  When you have gastroesophageal reflux disease (GERD),  food and lifestyle choices may be very helpful in easing the discomfort of GERD.  Eat frequent, small meals instead of three large meals each day. Eat your meals slowly, in a relaxed setting. Avoid bending over or lying down until 2-3 hours after eating.  Limit high-fat foods such as fatty meats or fried foods. This information is not intended to replace advice given to you by your health care provider. Make sure you discuss any questions you have with your health care provider. Document Revised: 01/10/2020  Document Reviewed: 01/10/2020 Elsevier Patient Education  [address].

## 2020-10-05 NOTE — Progress Notes (Signed)
Referring Provider: Sharilyn Sites, MD Primary Care Physician:  Sharilyn Sites, MD Primary GI:  Dr. Gala Romney  Chief Complaint  Patient presents with  . NAFLD    F/u. Reports has stomach issues comes/goes, indigestion really bad at times    HPI:   Stephen Stephenson is a 70 y.o. male who presents for "liver issues".  Patient was last seen in our office 05/04/2019 for abdominal wall bulge and GERD.  At that time noted history of hepatitis C status post eradication with noted increased fibrosis.  Due for esophageal variceal screening in 2021.  History of colonic adenoma and due for surveillance colonoscopy in 2021 as well.  Feels he has a hernia.  Recommended CT of the abdomen and pelvis, continue omeprazole, plan for EGD and colonoscopy in the spring.  CT of the abdomen and pelvis with contrast was completed 05/19/2019 which found no acute findings.  Findings suspicious for progressive hepatic cirrhosis since prior study without evidence of hepatoma and recommended correlation with LFTs and consider hepatic elastography.  Also noted colonic diverticulosis, mildly enlarged prostate, nonobstructing left renal calculus, aortic atherosclerosis.  Recommended if hernia symptoms continue plan for referral to surgeon.  Otherwise follow-up in office as previously recommended.  Patient did undergo EGD 01/08/2018 which found nonerosive reflux esophagitis status post Maloney dilation, portal hypertensive gastropathy, normal duodenum.  Recommended repeat EGD in 2 years for screening purposes (2021).  Return to GI clinic in 3 months, increase omeprazole 40 mg daily.  He separately underwent a colonoscopy as recommended 07/05/2020 which found a single 3 mm polyp at the rectosigmoid colon and a 7 mm innocent appearing AVM at the base of the cecum, otherwise normal.  Surgical pathology found the polyp to be hyperplastic.  Recommended no future colonoscopy unless new symptoms develop.  No recent liver labs in our  system.  Today he states he is doing okay overall. He has an ultrasound on his kidneys every year. States he's having a lot of GERD/reflux. He has been on omeprazole once daily, but takes it only as needed. Was taking it daily, but slacked off. Now when he gets heartburn it's pretty bad. When he was taking omeprazole was working pretty well, though sometimes has breakthrough. Is wondering if a different medication would work better. Since having COVID-19 a couple years ago his stomach will wake him with "growling", diarrhea, and discomfort and belching; has only occurred a few times in the past couple years. Denies abdominal pain, N/V, hematochezia, melena, fever, chills, unintentional weight loss. Denies any yellowing of the skin/eyes, darkened urine, acute episodic confusion, generalized tremors, generalized pruritis. Denies URI or flu-like symptoms. Denies loss of sense of taste or smell. The patient has received COVID-19 vaccination(s). They have also had a booster dose. Denies chest pain, dyspnea, dizziness, lightheadedness, syncope, near syncope. Denies any other upper or lower GI symptoms.  Also notes rare/occasional abdominal pain lower abdomen that lasts half a day to a day. Was previously told "it may be colitis". Recommended he call us if this occurs so it can be evaluated when going on.  Past Medical History:  Diagnosis Date  . DM (diabetes mellitus) ([place])    type II  . GERD (gastroesophageal reflux disease)   . Hb-AS genotype ([place])   . Hepatitis C    genotype 1 eradicated  . IBS (irritable bowel syndrome)   . Insomnia   . Kidney stone   . NASH (nonalcoholic steatohepatitis)    Metavir F3, 10/2013, patient reports  completing hepatitis a and B vaccination. 2016    Past Surgical History:  Procedure Laterality Date  . BACK SURGERY    . CHOLECYSTECTOMY    . COLONOSCOPY  02/05/02   anal canal herrhoids ow normal/normal ileum/suspect pt bled from hemorrhoids presently  . COLONOSCOPY   09/27/2011   RUE:[id number] hemorrhoids. Hepatic flexure polyp-removed TUBULAR ADENOMA, due for surveillance March 2016  . COLONOSCOPY N/A 01/20/2015   BJY:NWGNFA colonic polyp removed/colonic diverticulosis  . COLONOSCOPY N/A 07/05/2020   Procedure: COLONOSCOPY;  Surgeon: [name], MD;  Location: [facility];  Service: Endoscopy;  Laterality: N/A;  12:00  . CYSTOSCOPY W/ URETERAL STENT PLACEMENT Left 02/03/2013   Procedure: CYSTOSCOPY WITH RETROGRADE PYELOGRAM/URETERAL STENT PLACEMENT;  Surgeon: Marissa Nestle, MD;  Location: [facility];  Service: Urology;  Laterality: Left;  . ESOPHAGOGASTRODUODENOSCOPY  05/17/05   normal esophagus/gastric mucosa appeared normal  . ESOPHAGOGASTRODUODENOSCOPY N/A 04/25/2014   Abnormal gastric mucosa, benign biopsies. No esophageal varices. Next EGD October 2018.  [name] ESOPHAGOGASTRODUODENOSCOPY (EGD) WITH PROPOFOL N/A 01/08/2018   Procedure: ESOPHAGOGASTRODUODENOSCOPY (EGD) WITH PROPOFOL;  Surgeon: [name], MD;  Location: [facility];  Service: Endoscopy;  Laterality: N/A;  7:30am  . EXTRACORPOREAL SHOCK WAVE LITHOTRIPSY Left 02/03/2013   Procedure: EXTRACORPOREAL SHOCK WAVE LITHOTRIPSY (ESWL) LEFT RENAL CALCULUS;  Surgeon: Marissa Nestle, MD;  Location: [facility];  Service: Urology;  Laterality: Left;  . KIDNEY STONE SURGERY    . MALONEY DILATION N/A 01/08/2018   Procedure: Venia Minks DILATION;  Surgeon: [name], MD;  Location: [facility];  Service: Endoscopy;  Laterality: N/A;  . POLYPECTOMY  07/05/2020   Procedure: POLYPECTOMY;  Surgeon: [name], MD;  Location: [facility];  Service: Endoscopy;;  recto-sigmoid   . SHOULDER SURGERY      Current Outpatient Medications  Medication Sig Dispense Refill  . glimepiride (AMARYL) 4 MG tablet Take 4 mg by mouth daily with breakfast.    . JANUMET 50-1000 MG tablet Take 1 tablet by mouth 2 (two) times daily.    [name] omeprazole (PRILOSEC) 40 MG capsule Take 1 capsule by mouth once daily  (Patient taking differently: Take 40 mg by mouth daily.) 90 capsule 0  . potassium citrate (UROCIT-K) 10 MEQ (1080 MG) SR tablet Take 10 mEq by mouth 2 (two) times daily.      No current facility-administered medications for this visit.    Allergies as of 10/05/2020  . (No Known Allergies)    Family History  Problem Relation Age of Onset  . Liver disease Father        ? Hepatitis C; Alcoholic  . Colon cancer Neg Hx     Social History   Socioeconomic History  . Marital status: Married    Spouse name: Not on file  . Number of children: Not on file  . Years of education: Not on file  . Highest education level: Not on file  Occupational History  . Not on file  Tobacco Use  . Smoking status: Former Smoker    Packs/day: 1.00    Years: 25.00    Pack years: 25.00    Types: Cigarettes    Quit date: 08/02/1995    Years since quitting: 25.1  . Smokeless tobacco: Former Systems developer    Quit date: 10/12/1992  Vaping Use  . Vaping Use: Never used  Substance and Sexual Activity  . Alcohol use: Not Currently    Alcohol/week: 0.0 standard drinks    Comment: Occasionally; Average one  drink every few months; Summer will occasionally drink a bottle of wine.  . Drug use: No  . Sexual activity: Yes    Birth control/protection: None  Other Topics Concern  . Not on file  Social History Narrative  . Not on file   Social Determinants of Health   Financial Resource Strain: Not on file  Food Insecurity: Not on file  Transportation Needs: Not on file  Physical Activity: Not on file  Stress: Not on file  Social Connections: Not on file    Subjective: Review of Systems  Constitutional: Negative for chills, fever, malaise/fatigue and weight loss.  HENT: Negative for congestion and sore throat.   Respiratory: Negative for cough and shortness of breath.   Cardiovascular: Negative for chest pain and palpitations.  Gastrointestinal: Positive for abdominal pain, diarrhea and heartburn. Negative  for blood in stool, constipation, melena, nausea and vomiting.  Musculoskeletal: Negative for joint pain and myalgias.  Skin: Negative for rash.  Neurological: Negative for dizziness and weakness.  Endo/Heme/Allergies: Does not bruise/bleed easily.  Psychiatric/Behavioral: Negative for depression. The patient is not nervous/anxious.   All other systems reviewed and are negative.    Objective: BP 124/76   Pulse 86   Temp (!) 97.1 F (36.2 C)   Ht 6' 1"  (1.854 m)   Wt 209 lb (94.8 kg)   BMI 27.57 kg/m  Physical Exam Vitals and nursing note reviewed.  Constitutional:      General: He is not in acute distress.    Appearance: Normal appearance. He is normal weight. He is not ill-appearing, toxic-appearing or diaphoretic.  HENT:     Head: Normocephalic and atraumatic.     Nose: No congestion or rhinorrhea.  Eyes:     General: No scleral icterus. Cardiovascular:     Rate and Rhythm: Normal rate and regular rhythm.     Heart sounds: Normal heart sounds.  Pulmonary:     Effort: Pulmonary effort is normal.     Breath sounds: Normal breath sounds.  Abdominal:     General: Bowel sounds are normal. There is no distension.     Palpations: Abdomen is soft. There is no hepatomegaly, splenomegaly or mass.     Tenderness: There is no abdominal tenderness. There is no guarding or rebound.     Hernia: No hernia is present.  Musculoskeletal:     Cervical back: Neck supple.  Skin:    General: Skin is warm and dry.     Coloration: Skin is not jaundiced.     Findings: No bruising or rash.  Neurological:     General: No focal deficit present.     Mental Status: He is alert and oriented to person, place, and time. Mental status is at baseline.  Psychiatric:        Mood and Affect: Mood normal.        Behavior: Behavior normal.        Thought Content: Thought content normal.      Assessment:  Pleasant 71 year old male who presents for follow-up on liver issues, possible early cirrhosis  with a history of hepatitis C status post eradication and fatty liver disease.  Also complains of GERD/indigestion as well as abdominal pain associated with diarrhea that occurs rarely.  No red flag/warning signs or symptoms at this time.  Liver disease: At this point has not had recent labs in our system.  I will check CBC, CMP, INR, AFP.  Possible progression of fatty liver disease with remote history of  hepatitis C status post eradication.  CT imaging recently showed questionable early cirrhosis and recommended elastography.  We will schedule the elastography as well to judge the degree of scarring.  If he does indeed have early cirrhosis he will need to be enrolled in routine liver care.  Further recommendations will follow.  Generally asymptomatic from a hepatic standpoint today.  GERD/indigestion: Ran out of omeprazole.  Previously recommended to take omeprazole daily.  However, he states he "slacked off" and was only taking it if remembered/as needed.  He would like to try different medication because he does not feel omeprazole is very effective, even though he only takes it as needed.  I will switch his medication to Protonix 40 mg daily.  He can use Tums, Rolaids, or the like for flare of symptoms.  I will also print GERD education as well.  Reassess at follow-up  Occasional/rare abdominal pain with diarrhea: States this happens a couple times every several months.  Generally only lasts a day.  I recommend he call us when this occurs we can further evaluated.  At this point, as he is asymptomatic, it is difficult to really try to pack down what could be going on.   Plan: 1. Switch omeprazole to Protonix 40 mg daily 2. Trigger avoidance 3. GERD printed information 4. Tums/Rolaids, etc. for flares 5. Liver elastography 6. CBC, CMP, INR, AFP 7. Follow-up in 3 months    Thank you for allowing [place] to participate in the care of Ilda Basset, DNP, AGNP-C Adult & Gerontological  Nurse Practitioner [facility]   10/05/2020 10:22 AM   Disclaimer: This note was dictated with voice recognition software. Similar sounding words can inadvertently be transcribed and may not be corrected upon review.

## 2020-10-09 ENCOUNTER — Inpatient Hospital Stay (HOSPITAL_COMMUNITY)
Admission: EM | Admit: 2020-10-09 | Discharge: 2020-10-12 | DRG: 175 | Disposition: A | Discharge status: Home / Self Care | Payer: PPO | Attending: Internal Medicine | Admitting: Internal Medicine

## 2020-10-09 ENCOUNTER — Encounter (HOSPITAL_COMMUNITY): Payer: Self-pay

## 2020-10-09 ENCOUNTER — Emergency Department (HOSPITAL_COMMUNITY): Payer: PPO

## 2020-10-09 ENCOUNTER — Other Ambulatory Visit: Payer: Self-pay

## 2020-10-09 DIAGNOSIS — I10 Essential (primary) hypertension: Secondary | ICD-10-CM | POA: Diagnosis present

## 2020-10-09 DIAGNOSIS — Z7984 Long term (current) use of oral hypoglycemic drugs: Secondary | ICD-10-CM

## 2020-10-09 DIAGNOSIS — R0602 Shortness of breath: Secondary | ICD-10-CM | POA: Diagnosis not present

## 2020-10-09 DIAGNOSIS — B1920 Unspecified viral hepatitis C without hepatic coma: Secondary | ICD-10-CM | POA: Diagnosis present

## 2020-10-09 DIAGNOSIS — Z20822 Contact with and (suspected) exposure to covid-19: Secondary | ICD-10-CM | POA: Diagnosis not present

## 2020-10-09 DIAGNOSIS — I959 Hypotension, unspecified: Secondary | ICD-10-CM | POA: Diagnosis present

## 2020-10-09 DIAGNOSIS — S0990XA Unspecified injury of head, initial encounter: Secondary | ICD-10-CM | POA: Diagnosis not present

## 2020-10-09 DIAGNOSIS — E1165 Type 2 diabetes mellitus with hyperglycemia: Secondary | ICD-10-CM | POA: Diagnosis not present

## 2020-10-09 DIAGNOSIS — I82442 Acute embolism and thrombosis of left tibial vein: Secondary | ICD-10-CM | POA: Diagnosis present

## 2020-10-09 DIAGNOSIS — K589 Irritable bowel syndrome without diarrhea: Secondary | ICD-10-CM | POA: Diagnosis not present

## 2020-10-09 DIAGNOSIS — M25511 Pain in right shoulder: Secondary | ICD-10-CM | POA: Diagnosis not present

## 2020-10-09 DIAGNOSIS — E7849 Other hyperlipidemia: Secondary | ICD-10-CM | POA: Diagnosis not present

## 2020-10-09 DIAGNOSIS — I82432 Acute embolism and thrombosis of left popliteal vein: Secondary | ICD-10-CM | POA: Diagnosis not present

## 2020-10-09 DIAGNOSIS — Z79899 Other long term (current) drug therapy: Secondary | ICD-10-CM | POA: Diagnosis not present

## 2020-10-09 DIAGNOSIS — R52 Pain, unspecified: Secondary | ICD-10-CM

## 2020-10-09 DIAGNOSIS — M47812 Spondylosis without myelopathy or radiculopathy, cervical region: Secondary | ICD-10-CM | POA: Diagnosis not present

## 2020-10-09 DIAGNOSIS — K219 Gastro-esophageal reflux disease without esophagitis: Secondary | ICD-10-CM | POA: Diagnosis not present

## 2020-10-09 DIAGNOSIS — I4510 Unspecified right bundle-branch block: Secondary | ICD-10-CM | POA: Diagnosis present

## 2020-10-09 DIAGNOSIS — M79662 Pain in left lower leg: Secondary | ICD-10-CM | POA: Diagnosis not present

## 2020-10-09 DIAGNOSIS — G8929 Other chronic pain: Secondary | ICD-10-CM | POA: Diagnosis not present

## 2020-10-09 DIAGNOSIS — J9601 Acute respiratory failure with hypoxia: Secondary | ICD-10-CM | POA: Diagnosis not present

## 2020-10-09 DIAGNOSIS — I2699 Other pulmonary embolism without acute cor pulmonale: Principal | ICD-10-CM | POA: Diagnosis present

## 2020-10-09 DIAGNOSIS — K7581 Nonalcoholic steatohepatitis (NASH): Secondary | ICD-10-CM | POA: Diagnosis present

## 2020-10-09 DIAGNOSIS — Z87891 Personal history of nicotine dependence: Secondary | ICD-10-CM

## 2020-10-09 DIAGNOSIS — R0789 Other chest pain: Secondary | ICD-10-CM | POA: Diagnosis not present

## 2020-10-09 DIAGNOSIS — R55 Syncope and collapse: Secondary | ICD-10-CM | POA: Diagnosis not present

## 2020-10-09 DIAGNOSIS — M19011 Primary osteoarthritis, right shoulder: Secondary | ICD-10-CM | POA: Diagnosis not present

## 2020-10-09 DIAGNOSIS — I2602 Saddle embolus of pulmonary artery with acute cor pulmonale: Secondary | ICD-10-CM | POA: Diagnosis not present

## 2020-10-09 DIAGNOSIS — I2609 Other pulmonary embolism with acute cor pulmonale: Secondary | ICD-10-CM | POA: Diagnosis not present

## 2020-10-09 DIAGNOSIS — R4020 Unspecified coma: Secondary | ICD-10-CM | POA: Diagnosis not present

## 2020-10-09 DIAGNOSIS — R000 Tachycardia, unspecified: Secondary | ICD-10-CM | POA: Diagnosis not present

## 2020-10-09 DIAGNOSIS — R42 Dizziness and giddiness: Secondary | ICD-10-CM | POA: Diagnosis not present

## 2020-10-09 DIAGNOSIS — J9811 Atelectasis: Secondary | ICD-10-CM | POA: Diagnosis not present

## 2020-10-09 DIAGNOSIS — R079 Chest pain, unspecified: Secondary | ICD-10-CM | POA: Diagnosis not present

## 2020-10-09 LAB — COMPREHENSIVE METABOLIC PANEL
ALT: 26 U/L (ref 0–44)
AST: 26 U/L (ref 15–41)
Albumin: 3.6 g/dL (ref 3.5–5.0)
Alkaline Phosphatase: 38 U/L (ref 38–126)
Anion gap: 14 (ref 5–15)
BUN: 22 mg/dL (ref 8–23)
CO2: 20 mmol/L — ABNORMAL LOW (ref 22–32)
Calcium: 9.5 mg/dL (ref 8.9–10.3)
Chloride: 99 mmol/L (ref 98–111)
Creatinine, Ser: 1.47 mg/dL — ABNORMAL HIGH (ref 0.61–1.24)
GFR, Estimated: 51 mL/min — ABNORMAL LOW (ref >60)
Glucose, Bld: 326 mg/dL — ABNORMAL HIGH (ref 70–99)
Potassium: 3.7 mmol/L (ref 3.5–5.1)
Sodium: 133 mmol/L — ABNORMAL LOW (ref 135–145)
Total Bilirubin: 1 mg/dL (ref 0.3–1.2)
Total Protein: 7.2 g/dL (ref 6.5–8.1)

## 2020-10-09 LAB — CBC WITH DIFFERENTIAL/PLATELET
Abs Immature Granulocytes: 0.1 10*3/uL — ABNORMAL HIGH (ref 0.00–0.07)
Basophils Absolute: 0.1 10*3/uL (ref 0.0–0.1)
Basophils Relative: 0 %
Eosinophils Absolute: 0.1 10*3/uL (ref 0.0–0.5)
Eosinophils Relative: 1 %
HCT: 46.8 % (ref 39.0–52.0)
Hemoglobin: 15.8 g/dL (ref 13.0–17.0)
Immature Granulocytes: 1 %
Lymphocytes Relative: 21 %
Lymphs Abs: 2.4 10*3/uL (ref 0.7–4.0)
MCH: 30.9 pg (ref 26.0–34.0)
MCHC: 33.8 g/dL (ref 30.0–36.0)
MCV: 91.6 fL (ref 80.0–100.0)
Monocytes Absolute: 1 10*3/uL (ref 0.1–1.0)
Monocytes Relative: 9 %
Neutro Abs: 7.8 10*3/uL — ABNORMAL HIGH (ref 1.7–7.7)
Neutrophils Relative %: 68 %
Platelets: 125 10*3/uL — ABNORMAL LOW (ref 150–400)
RBC: 5.11 MIL/uL (ref 4.22–5.81)
RDW: 13.2 % (ref 11.5–15.5)
WBC: 11.5 10*3/uL — ABNORMAL HIGH (ref 4.0–10.5)
nRBC: 0 % (ref 0.0–0.2)

## 2020-10-09 LAB — RAPID URINE DRUG SCREEN, HOSP PERFORMED
Amphetamines: NOT DETECTED
Barbiturates: NOT DETECTED
Benzodiazepines: NOT DETECTED
Cocaine: NOT DETECTED
Opiates: NOT DETECTED
Tetrahydrocannabinol: NOT DETECTED

## 2020-10-09 LAB — APTT
aPTT: 29 seconds (ref 24–36)
aPTT: 62 seconds — ABNORMAL HIGH (ref 24–36)

## 2020-10-09 LAB — BRAIN NATRIURETIC PEPTIDE: B Natriuretic Peptide: 41 pg/mL (ref 0.0–100.0)

## 2020-10-09 LAB — RESP PANEL BY RT-PCR (FLU A&B, COVID) ARPGX2
Influenza A by PCR: NEGATIVE
Influenza B by PCR: NEGATIVE
SARS Coronavirus 2 by RT PCR: NEGATIVE

## 2020-10-09 LAB — LIPID PANEL
Cholesterol: 154 mg/dL (ref 0–200)
HDL: 41 mg/dL (ref >40)
LDL Cholesterol: 75 mg/dL (ref 0–99)
Total CHOL/HDL Ratio: 3.8 RATIO
Triglycerides: 191 mg/dL — ABNORMAL HIGH (ref <150)
VLDL: 38 mg/dL (ref 0–40)

## 2020-10-09 LAB — GLUCOSE, CAPILLARY: Glucose-Capillary: 189 mg/dL — ABNORMAL HIGH (ref 70–99)

## 2020-10-09 LAB — TSH: TSH: 4.219 u[IU]/mL (ref 0.350–4.500)

## 2020-10-09 LAB — LACTIC ACID, PLASMA: Lactic Acid, Venous: 2 mmol/L (ref 0.5–1.9)

## 2020-10-09 LAB — TROPONIN I (HIGH SENSITIVITY)
Troponin I (High Sensitivity): 32 ng/L — ABNORMAL HIGH (ref <18)
Troponin I (High Sensitivity): 249 ng/L (ref <18)

## 2020-10-09 LAB — PROTIME-INR
INR: 1.1 (ref 0.8–1.2)
Prothrombin Time: 14.1 seconds (ref 11.4–15.2)

## 2020-10-09 MED ORDER — HEPARIN (PORCINE) 25000 UT/250ML-% IV SOLN
1500.0000 [IU]/h | INTRAVENOUS | Status: DC
  Administered 2020-10-09: 1500 [IU]/h via INTRAVENOUS
  Filled 2020-10-09: qty 250

## 2020-10-09 MED ORDER — INSULIN ASPART 100 UNIT/ML ~~LOC~~ SOLN
0.0000 [IU] | Freq: Every day | SUBCUTANEOUS | Status: DC
  Administered 2020-10-10: 2 [IU] via SUBCUTANEOUS

## 2020-10-09 MED ORDER — HYDRALAZINE HCL 20 MG/ML IJ SOLN
10.0000 mg | Freq: Three times a day (TID) | INTRAMUSCULAR | Status: DC | PRN
  Administered 2020-10-10: 20 mg via INTRAVENOUS
  Filled 2020-10-09: qty 1

## 2020-10-09 MED ORDER — INSULIN ASPART 100 UNIT/ML ~~LOC~~ SOLN
0.0000 [IU] | Freq: Three times a day (TID) | SUBCUTANEOUS | Status: DC
  Administered 2020-10-10: 5 [IU] via SUBCUTANEOUS
  Administered 2020-10-10: 11 [IU] via SUBCUTANEOUS
  Administered 2020-10-10 – 2020-10-11 (×2): 5 [IU] via SUBCUTANEOUS
  Administered 2020-10-11: 3 [IU] via SUBCUTANEOUS
  Administered 2020-10-11: 5 [IU] via SUBCUTANEOUS
  Administered 2020-10-12: 8 [IU] via SUBCUTANEOUS

## 2020-10-09 MED ORDER — HEPARIN BOLUS VIA INFUSION
5000.0000 [IU] | Freq: Once | INTRAVENOUS | Status: AC
  Administered 2020-10-09: 5000 [IU] via INTRAVENOUS

## 2020-10-09 MED ORDER — POLYETHYLENE GLYCOL 3350 17 G PO PACK: 17.0000 g | PACK | Freq: Every day | ORAL | Status: DC | PRN

## 2020-10-09 MED ORDER — ALTEPLASE (PULMONARY EMBOLISM) INFUSION
50.0000 mg | Freq: Once | INTRAVENOUS | Status: AC
  Administered 2020-10-09: 50 mg via INTRAVENOUS
  Filled 2020-10-09: qty 100
  Filled 2020-10-09: qty 50

## 2020-10-09 MED ORDER — SODIUM CHLORIDE 0.9 % IV SOLN
250.0000 mL | Freq: Once | INTRAVENOUS | Status: AC
  Administered 2020-10-09: 250 mL via INTRAVENOUS

## 2020-10-09 MED ORDER — DOCUSATE SODIUM 100 MG PO CAPS: 100.0000 mg | ORAL_CAPSULE | Freq: Two times a day (BID) | ORAL | Status: DC | PRN

## 2020-10-09 MED ORDER — ALTEPLASE (PULMONARY EMBOLISM) INFUSION: 50.0000 mg | Freq: Once | INTRAVENOUS | Status: DC

## 2020-10-09 MED ORDER — MELATONIN 3 MG PO TABS
3.0000 mg | ORAL_TABLET | Freq: Every day | ORAL | Status: DC
  Administered 2020-10-09 – 2020-10-11 (×3): 3 mg via ORAL
  Filled 2020-10-09 (×3): qty 1

## 2020-10-09 MED ORDER — ALTEPLASE (PULMONARY EMBOLISM) INFUSION: 100.0000 mg | Freq: Once | INTRAVENOUS | Status: DC

## 2020-10-09 MED ORDER — HEPARIN (PORCINE) 25000 UT/250ML-% IV SOLN
1950.0000 [IU]/h | INTRAVENOUS | Status: DC
  Administered 2020-10-09: 1300 [IU]/h via INTRAVENOUS
  Administered 2020-10-10: 1650 [IU]/h via INTRAVENOUS
  Filled 2020-10-09 (×3): qty 250

## 2020-10-09 MED ORDER — IOHEXOL 350 MG/ML SOLN
100.0000 mL | Freq: Once | INTRAVENOUS | Status: AC | PRN
  Administered 2020-10-09: 100 mL via INTRAVENOUS

## 2020-10-09 MED ORDER — MORPHINE SULFATE (PF) 4 MG/ML IV SOLN
4.0000 mg | Freq: Once | INTRAVENOUS | Status: AC
  Administered 2020-10-09: 4 mg via INTRAVENOUS
  Filled 2020-10-09: qty 1

## 2020-10-09 NOTE — ED Notes (Signed)
Per Dr.Miller, place pt on 5L with NRB.

## 2020-10-09 NOTE — ED Notes (Signed)
Lactic Acid 2.0 reported to EDP miller at this time.. No new orders

## 2020-10-09 NOTE — ED Provider Notes (Addendum)
[facility] EMERGENCY DEPARTMENT Provider Note   CSN: [id number] Arrival date & time: 10/09/20  1534     History Chief Complaint  Patient presents with  . Loss of Consciousness    Stephen Stephenson is a 70 y.o. male.  HPI   Patient is a 70 year old male he is a known diabetic and recently started medication for high blood pressure as well though he cannot remember the name.  The patient reports that he was in his usual state of health except for some slight increased coughing recently when today he had 2 different syncopal episodes, he was feeling a right anterior chest pain just right of midline, this is worse with taking a deep breath, this is associated with pain in the left calf.  Then he had a syncopal episode, he hit his head on the ground, he was able to get back up and within 20 steps he had another one.  The patient works as a Recruitment consultant, he has not had any recent other long travel immobilization or surgery, no history of cancer, no history of blood clots or myocardial infarction or other cardiac disease.  The patient arrives by paramedic transport hypoxic tachycardic and in distress.  Level 5 caveat applies secondary to the degree of critical illness.  Past Medical History:  Diagnosis Date  . DM (diabetes mellitus) ([place])    type II  . GERD (gastroesophageal reflux disease)   . Hb-AS genotype ([place])   . Hepatitis C    genotype 1 eradicated  . IBS (irritable bowel syndrome)   . Insomnia   . Kidney stone   . NASH (nonalcoholic steatohepatitis)    Metavir F3, 10/2013, patient reports completing hepatitis a and B vaccination. 2016    Patient Active Problem List   Diagnosis Date Noted  . Pulmonary emboli ([place]) 10/09/2020  . Hepatic cirrhosis ([place]) 10/05/2020  . History of colonic polyps   . Diverticulosis of colon without hemorrhage   . Hx of adenomatous colonic polyps 12/19/2014  . NAFLD (nonalcoholic fatty liver disease) 08/09/2013  . Hemorrhoid 08/09/2013  . Diarrhea  08/09/2013  . Abdominal pain 08/09/2013  . IBS (irritable bowel syndrome) 10/08/2012  . Kidney stone 03/14/2012  . HTN (hypertension) 03/14/2012  . DM (diabetes mellitus) ([place]) 03/14/2012  . History of hepatitis C 03/14/2012  . GERD (gastroesophageal reflux disease) 05/21/2011    Past Surgical History:  Procedure Laterality Date  . BACK SURGERY    . CHOLECYSTECTOMY    . COLONOSCOPY  02/05/02   anal canal herrhoids ow normal/normal ileum/suspect pt bled from hemorrhoids presently  . COLONOSCOPY  09/27/2011   DGU:YQIHKVQQ hemorrhoids. Hepatic flexure polyp-removed TUBULAR ADENOMA, due for surveillance March 2016  . COLONOSCOPY N/A 01/20/2015   VZD:GLOVFI colonic polyp removed/colonic diverticulosis  . COLONOSCOPY N/A 07/05/2020   Procedure: COLONOSCOPY;  Surgeon: [name], MD;  Location: [facility] END[facility];  Service: Endoscopy;  Laterality: N/A;  12:00  . CYSTOSCOPY W/ URETERAL STENT PLACEMENT Left 02/03/2013   Procedure: CYSTOSCOPY WITH RETROGRADE PYELOGRAM/URETERAL STENT PLACEMENT;  Surgeon: Marissa Nestle, MD;  Location: [facility] ORS;  Service: Urology;  Laterality: Left;  . ESOPHAGOGASTRODUODENOSCOPY  05/17/05   normal esophagus/gastric mucosa appeared normal  . ESOPHAGOGASTRODUODENOSCOPY N/A 04/25/2014   Abnormal gastric mucosa, benign biopsies. No esophageal varices. Next EGD October 2018.  [name] ESOPHAGOGASTRODUODENOSCOPY (EGD) WITH PROPOFOL N/A 01/08/2018   Procedure: ESOPHAGOGASTRODUODENOSCOPY (EGD) WITH PROPOFOL;  Surgeon: [name], MD;  Location: [facility];  Service: Endoscopy;  Laterality: N/A;  7:30am  . EXTRACORPOREAL SHOCK WAVE LITHOTRIPSY Left 02/03/2013   Procedure: EXTRACORPOREAL SHOCK WAVE LITHOTRIPSY (ESWL) LEFT RENAL CALCULUS;  Surgeon: Marissa Nestle, MD;  Location: [facility] ORS;  Service: Urology;  Laterality: Left;  . KIDNEY STONE SURGERY    . MALONEY DILATION N/A 01/08/2018   Procedure: Venia Minks DILATION;  Surgeon: [name], MD;  Location: [facility];   Service: Endoscopy;  Laterality: N/A;  . POLYPECTOMY  07/05/2020   Procedure: POLYPECTOMY;  Surgeon: [name], MD;  Location: [facility];  Service: Endoscopy;;  recto-sigmoid   . SHOULDER SURGERY         Family History  Problem Relation Age of Onset  . Liver disease Father        ? Hepatitis C; Alcoholic  . Colon cancer Neg Hx     Social History   Tobacco Use  . Smoking status: Former Smoker    Packs/day: 1.00    Years: 25.00    Pack years: 25.00    Types: Cigarettes    Quit date: 08/02/1995    Years since quitting: 25.2  . Smokeless tobacco: Former Systems developer    Quit date: 10/12/1992  Vaping Use  . Vaping Use: Never used  Substance Use Topics  . Alcohol use: Not Currently    Alcohol/week: 0.0 standard drinks    Comment: Occasionally; Average one drink every few months; Summer will occasionally drink a bottle of wine.  . Drug use: No    Home Medications Prior to Admission medications   Medication Sig Start Date End Date Taking? Authorizing Provider  glimepiride (AMARYL) 4 MG tablet Take 4 mg by mouth daily with breakfast.    [provider]  JANUMET 50-1000 MG tablet Take 1 tablet by mouth 2 (two) times daily. 04/11/20   [provider]  omeprazole (PRILOSEC) 40 MG capsule Take 1 capsule by mouth once daily Patient taking differently: Take 40 mg by mouth daily. 01/18/20   [name], NP  pantoprazole (PROTONIX) 40 MG tablet Take 1 tablet (40 mg total) by mouth daily. 10/05/20   Carlis Stable, NP  potassium citrate (UROCIT-K) 10 MEQ (1080 MG) SR tablet Take 10 mEq by mouth 2 (two) times daily.     [provider]    Allergies    Patient has no known allergies.  Review of Systems   Review of Systems  Unable to perform ROS: Acuity of condition    Physical Exam Updated Vital Signs BP 123/81   Pulse (!) 119   Resp 17   Ht 1.854 m (6' 1" )   Wt 96.2 kg   SpO2 97%   BMI 27.97 kg/m   Physical Exam Vitals and nursing note reviewed.   Constitutional:      General: He is in acute distress.     Appearance: He is well-developed. He is ill-appearing.  HENT:     Head: Normocephalic and atraumatic.     Comments: There is no obvious trauma about the head or the neck, he has a headache    Mouth/Throat:     Pharynx: No oropharyngeal exudate.  Eyes:     General: No scleral icterus.       Right eye: No discharge.        Left eye: No discharge.     Conjunctiva/sclera: Conjunctivae normal.     Pupils: Pupils are equal, round, and reactive to light.  Neck:     Thyroid: No thyromegaly.     Vascular: No  JVD.  Cardiovascular:     Rate and Rhythm: Regular rhythm. Tachycardia present.     Heart sounds: Normal heart sounds. No murmur heard. No friction rub. No gallop.   Pulmonary:     Effort: Respiratory distress present.     Breath sounds: Normal breath sounds. No wheezing or rales.     Comments: Mild tachypnea Abdominal:     General: Bowel sounds are normal. There is no distension.     Palpations: Abdomen is soft. There is no mass.     Tenderness: There is no abdominal tenderness.  Musculoskeletal:        General: No tenderness. Normal range of motion.     Cervical back: Normal range of motion and neck supple.     Comments: No edema of the bilateral legs, mild tenderness in the left calf, no palpable cords.  No spinal tenderness  Lymphadenopathy:     Cervical: No cervical adenopathy.  Skin:    General: Skin is warm and dry.     Findings: No erythema or rash.  Neurological:     Mental Status: He is alert.     Coordination: Coordination normal.     Comments: The patient is a little bit sleepy, he is able to follow commands, he is able to move all 4 extremities, he does not have any abnormal mental status  Psychiatric:        Behavior: Behavior normal.     ED Results / Procedures / Treatments   Labs (all labs ordered are listed, but only abnormal results are displayed) Labs Reviewed  CBC WITH DIFFERENTIAL/PLATELET  - Abnormal; Notable for the following components:      Result Value   WBC 11.5 (*)    Platelets 125 (*)    Neutro Abs 7.8 (*)    Abs Immature Granulocytes 0.10 (*)    All other components within normal limits  COMPREHENSIVE METABOLIC PANEL  RAPID URINE DRUG SCREEN, HOSP PERFORMED  TSH  LIPID PANEL  HEPARIN LEVEL (UNFRACTIONATED)  APTT  PROTIME-INR  TROPONIN I (HIGH SENSITIVITY)    EKG EKG Interpretation  Date/Time:  Monday October 09 2020 16:12:46 EDT Ventricular Rate:  115 PR Interval:    QRS Duration: 144 QT Interval:  363 QTC Calculation: 503 R Axis:   -3 Text Interpretation: Sinus tachycardia Right bundle branch block Since last tracing rate faster Confirmed by Noemi Chapel [id number]) on 10/09/2020 4:37:24 PM   Radiology CT Head Wo Contrast  Result Date: 10/09/2020 CLINICAL DATA:  Two syncopal episodes with loss of consciousness today. Unsure of head injury. Hypoxemia. EXAM: CT HEAD WITHOUT CONTRAST CT CERVICAL SPINE WITHOUT CONTRAST TECHNIQUE: Multidetector CT imaging of the head and cervical spine was performed following the standard protocol without intravenous contrast. Multiplanar CT image reconstructions of the cervical spine were also generated. COMPARISON:  CT head 10/02/2011. FINDINGS: CT HEAD FINDINGS Brain: There is no evidence of acute intracranial hemorrhage, mass lesion, brain edema or extra-axial fluid collection. Mild atrophy with mild prominence of the ventricles and subarachnoid spaces. There is no CT evidence of acute cortical infarction. Vascular: Intracranial vascular calcifications. No hyperdense vessel identified. Skull: Negative for fracture or focal lesion. Sinuses/Orbits: The visualized paranasal sinuses and mastoid air cells are clear. No orbital abnormalities are seen. Other: None. CT CERVICAL SPINE FINDINGS Alignment: Normal. Skull base and vertebrae: No evidence of acute fracture or traumatic subluxation. Soft tissues and spinal canal: No prevertebral  fluid or swelling. No visible canal hematoma. Disc levels: Relatively mild multilevel spondylosis  with disc bulging and uncinate spurring. There is asymmetric facet hypertrophy on the left at C4-5, contributing to mild left foraminal narrowing. Upper chest: Unremarkable. Other: Mild left carotid atherosclerosis. IMPRESSION: 1. No acute intracranial or calvarial findings. 2. No evidence of acute cervical spine fracture, traumatic subluxation or static signs of instability. 3. Mild cervical spondylosis. Electronically Signed   By: Richardean Sale M.D.   On: 10/09/2020 16:42   CT Angio Chest PE W and/or Wo Contrast  Result Date: 10/09/2020 CLINICAL DATA:  High probability of pulmonary embolism. Short of breath EXAM: CT ANGIOGRAPHY CHEST WITH CONTRAST TECHNIQUE: Multidetector CT imaging of the chest was performed using the standard protocol during bolus administration of intravenous contrast. Multiplanar CT image reconstructions and MIPs were obtained to evaluate the vascular anatomy. CONTRAST:  121m OMNIPAQUE IOHEXOL 350 MG/ML SOLN COMPARISON:  None. FINDINGS: Cardiovascular: Large filling defect within the distal RIGHT main pulmonary artery which extends into the lower lobe pulmonary arteries and upper lobe pulmonary arteries. The tubular filling defects are occlusive and consistent with acute pulmonary emboli. Filling defects within the proximal segmental branches of the LEFT lower lobe pulmonary arteries as well as the LEFT upper lobe pulmonary arteries. These filling defects are partially occlusive on the LEFT. The RIGHT ventricular ratio to LEFT ventricular ratio is equal to 2.78. Mediastinum/Nodes: No axillary or supraclavicular adenopathy. No mediastinal or hilar adenopathy. No pericardial fluid. Esophagus normal. Lungs/Pleura: Mild atelectasis at the lung bases. No clear evidence of pulmonary infarction. No pneumonia. Pneumothorax. Upper Abdomen: Limited view of the liver, kidneys, pancreas are  unremarkable. Normal adrenal glands. Musculoskeletal: No acute osseous abnormality. Review of the MIP images confirms the above findings. IMPRESSION: 1. Bilateral acute occlusive pulmonary emboli. Overall clot burden is severe with evidence of RIGHT heart strain (RV/LV Ratio = 2.78) consistent with at least submassive (intermediate risk) PE. The presence of right heart strain has been associated with an increased risk of morbidity and mortality. 2. No evidence of pulmonary infarction. Critical Value/emergent results were called by telephone at the time of interpretation on 10/09/2020 at 4:42 pm [facility] who verbally acknowledged these results. Electronically Signed   By: SSuzy BouchardM.D.   On: 10/09/2020 16:44   CT Cervical Spine Wo Contrast  Result Date: 10/09/2020 CLINICAL DATA:  Two syncopal episodes with loss of consciousness today. Unsure of head injury. Hypoxemia. EXAM: CT HEAD WITHOUT CONTRAST CT CERVICAL SPINE WITHOUT CONTRAST TECHNIQUE: Multidetector CT imaging of the head and cervical spine was performed following the standard protocol without intravenous contrast. Multiplanar CT image reconstructions of the cervical spine were also generated. COMPARISON:  CT head 10/02/2011. FINDINGS: CT HEAD FINDINGS Brain: There is no evidence of acute intracranial hemorrhage, mass lesion, brain edema or extra-axial fluid collection. Mild atrophy with mild prominence of the ventricles and subarachnoid spaces. There is no CT evidence of acute cortical infarction. Vascular: Intracranial vascular calcifications. No hyperdense vessel identified. Skull: Negative for fracture or focal lesion. Sinuses/Orbits: The visualized paranasal sinuses and mastoid air cells are clear. No orbital abnormalities are seen. Other: None. CT CERVICAL SPINE FINDINGS Alignment: Normal. Skull base and vertebrae: No evidence of acute fracture or traumatic subluxation. Soft tissues and spinal canal: No prevertebral fluid or  swelling. No visible canal hematoma. Disc levels: Relatively mild multilevel spondylosis with disc bulging and uncinate spurring. There is asymmetric facet hypertrophy on the left at C4-5, contributing to mild left foraminal narrowing. Upper chest: Unremarkable. Other: Mild left carotid atherosclerosis. IMPRESSION: 1. No acute  intracranial or calvarial findings. 2. No evidence of acute cervical spine fracture, traumatic subluxation or static signs of instability. 3. Mild cervical spondylosis. Electronically Signed   By: Richardean Sale M.D.   On: 10/09/2020 16:42    Procedures .Critical Care Performed by: Noemi Chapel, MD Authorized by: Noemi Chapel, MD   Critical care provider statement:    Critical care time (minutes):  75   Critical care time was exclusive of:  Separately billable procedures and treating other patients and teaching time   Critical care was necessary to treat or prevent imminent or life-threatening deterioration of the following conditions:  Circulatory failure   Critical care was time spent personally by me on the following activities:  Blood draw for specimens, development of treatment plan with patient or surrogate, discussions with consultants, evaluation of patient's response to treatment, examination of patient, obtaining history from patient or surrogate, ordering and performing treatments and interventions, ordering and review of laboratory studies, ordering and review of radiographic studies, pulse oximetry, re-evaluation of patient's condition and review of old charts Comments:           Medications Ordered in ED Medications  0.9 %  sodium chloride infusion (has no administration in time range)  alteplase (ACTIVASE) 1 mg/mL infusion 50 mg ( Intravenous Infusion Verify 10/09/20 1731)  heparin bolus via infusion 5,000 Units (5,000 Units Intravenous Bolus from Bag 10/09/20 1608)  iohexol (OMNIPAQUE) 350 MG/ML injection 100 mL (100 mLs Intravenous Contrast Given  10/09/20 1600)  0.9 %  sodium chloride infusion (0 mLs Intravenous Stopped 10/09/20 1714)    ED Course  I have reviewed the triage vital signs and the nursing notes.  Pertinent labs & imaging results that were available during my care of the patient were reviewed by me and considered in my medical decision making (see chart for details).    MDM Rules/Calculators/A&P                          Certainly this patient is tachycardic, he has hypoxic to approximately 75 to 80%, he appears a little pale and cyanotic.  No history of anemia or gastrointestinal bleeding  He does not take any anticoagulants including no aspirin  Arrives with 2 syncopal episodes after having chest pain, suspect pulmonary embolism, going to CT scan.  We will also check for trauma to the head and cervical spine, the patient is agreeable, he is critically ill.  I have gone to the CT with the pt and he has evidence of CT in all lung fields - He has O2 of 80% on RA - has O2 of 93% on 3 L and is 95% or more on NRB.  I have personally viewed the CT scan and my diagnosis is that he has massive pulmonary embolism in both lungs.  I discussed this with the radiologist when I called at 4:40 PM, they also agree that this is the case.  They also agreed that there is no signs of hemorrhage on the CT scan of the brain  He is still tachycardic, heparin is started, pt is critically ill - will d/w ICU re: location for admission - no hypotension at this time.  This patient's vital signs have changed, he has now more hypotensive with a blood pressure in the 90s, he has a narrow pulse pressure and continues to be tachycardic.  I discussed his care with Dr. Lynetta Mare of the ICU service on a conference call with the ICU  team and they have agreed to accept the patient to the ICU and recommend half dose TPA at 50 mg over 1 hour.  I have discussed this with her pharmacist who is facilitating this.  I have also discussed at length with the patient  that he is at risk for complications of bleeding, he is agreeable to take the medication though he knows that some of the complications could include bleeding and even death.  This patient is very critically ill  Rechecked multiple times - has improved HR to the 112 range - his BP has improved and he is having less pain - still requiring non rebreather - has been accepted to ICU - pending bed availability - told it would be within a short period of time.  TPA infused - pt did well.   Vitals:   10/09/20 1540 10/09/20 1541 10/09/20 1545  BP: 112/72    Pulse: (!) 111    Resp: (!) 21    SpO2: 92%  99%  Weight:  96.2 kg   Height:  1.854 m (6' 1" )      Final Clinical Impression(s) / ED Diagnoses Final diagnoses:  Other acute pulmonary embolism, unspecified whether acute cor pulmonale present (HCC)      Noemi Chapel, MD 10/09/20 1640    Noemi Chapel, MD 10/09/20 [phone]

## 2020-10-09 NOTE — H&P (Signed)
NAME:  Stephen Stephenson, MRN:  [id number], DOB:  Nov 30, 1950, LOS: 0 ADMISSION DATE:  10/09/2020, CONSULTATION DATE:  10/09/2020 REFERRING MD:  Dr. Sabra Heck, CHIEF COMPLAINT:  Pulmonary embolism with hemodynamic compromise    History of Present Illness:  70 year old male with PMHx significant for HTN, T2DM, NASH, hepatitis C and IBS who presented to [facility] with 2 episodes of syncope and significant hypoxia.  Of note, patient was recently started on antihypertensive medications. Patient reports development of a cough 2 days prior to admission, but chief complaint on admission was syncope x 2 with associated R anterior chest pain. Patient does report hitting his head during the first syncopal episode, but states that he quickly regained consciousness, stood up and ambulated approximately 20 steps before having repeat syncopal episode. Patient also reported L calf pain on admission. Patient denies any history of blood clots, cancer, tobacco use, recent long travel, immobilization, or surgery.  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  On arrival to [facility], patient was noted to have mild tachycardia and tachypnea. Given history concerning for PE, stat CTA chest was obtained. Primary interpretation of images per EDP revealed large PE with evidence of right heart strain (RV/LV ratio 2.78). PCCM was consulted for possible transfer and decision on need for thrombolytics. At time of call patient was noted to have worsening hemodynamics to include hypotension (reperoted SBP of 93) and worsening tachycardia with HR 120s. Given the presence of soft tissue frontal contusion (no obvious bleed on CT Head) decision was made to proceed with half dose TPA; patient was accepted for transfer to [facility].   On arrival to [facility], patient reported feeling "much better" than prior. Endorsed ongoing SOB and CP on deep inspiration, though able to maintain O2 sats on RA. Vitals remained stable to slightly  improved with HR 90s, BP 130s-150s and O2 sats > 95% on RA. Bedsided Echo did not show significant RV dilation. Echo and LE dopplers were ordered.  Pertinent  Medical History  Diabetes, HTN, NASH, hepatitis C, and IBS  Significant Hospital Events: Including procedures, antibiotic start and stop dates in addition to other pertinent events   . 3/28 - Presented to [facility] for syncope, chest pain, and left calf pain, found to have large PE. Half dose tPA given at [facility] with transfer to [facility] placed. Arrived at [facility] ~2230, improved hemodynamics; Echo/Dopplers ordered  Interim History / Subjective:    Objective   Blood pressure (!) 126/103, pulse (!) 111, resp. rate 17, height 6' 1"  (1.854 m), weight 96.2 kg, SpO2 92 %.       No intake or output data in the 24 hours ending 10/09/20 1624 Filed Weights   10/09/20 1541  Weight: 96.2 kg   Physical Examination: General: Acutely ill-appearing elderly male in NAD. HEENT: [place]/AT, anicteric sclera, PERRL, moist mucous membranes. Mildly HOH. Neuro: Awake, oriented x 4. Responds to verbal stimuli. Following commands consistently. Moves all 4 extremities spontaneously. Strength 5/5 in all 4 extremities. CV: Tachycardic, regular rhyhtm, no m/g/r. PULM: Breathing even and unlabored on RA. Lung fields CTAB, inspiration limited by splinting/pain. GI: Soft, nontender, nondistended. Normoactive bowel sounds. Extremities: No significant LE edema noted. Skin: Warm/dry, multiple areas of focal erythema noted from adhesive. Small laceration above L eyebrow, bilateral knees.  Labs/imaging that I have personally reviewed    3/28 CTA chest with bilateral occlusive PE with severe clot burden and evidence of right heart strain, with a RV/LV ratio of 2.78  3/28 Head CT negative   3/28 12-Lead EKG with tachycardia and RBBB that has been present on previous [facility]  Resolved Hospital Problem list     Assessment & Plan:  Acute pulmonary embolism with hemodynamic compromise  and right heart strain CTA Chest completed 3/28 demonstrating bilateral occlusive PE with severe clot burden and evidence of right heart strain, RV/LV ratio 2.78. PESI score 151 placing patient at a very high risk for mortality and severe morbidity. Management options including conservative management, lytic administration and thrombectomy discussed with decision for administration of lytic therapy (half-dose TPA in the setting of potential head trauma with syncopal episodes). - S/p TPA half dose at ~1730 3/28 - Therapeutic anticoagulation with heparin gtt - F/u Echo - F/u LE Dopplers - Eventual transition to PO anticoagulation once nearing discharge   Syncope with brief LOC Syncopal episode x 2 at home prior to admission; patient notes that he fell during first episode with +LOC. Likely related to significant clot burden with PE.  - Bedrest for now - F/u Echo result - Monitor mental status closely with TPA administration in the setting of fall with syncopal episode   History of Hepatitis C NASH History of Hep C treated in 90s with SVR. - Will need CT A/P surveillance to [facility] in the setting of Hep C history   History of hypertension Recently started on antihypertensives at home (olmesartan 27m daily). - Hold home antihypertensives at present in the setting of hypotension with PE - Goal MAP > 65 - Resume olmesartan 3/29AM - Hydralazine PRN for now  Type 2 diabetes  Home meds include Amaryl and Janumet, glucoses well-controlled at home per patient report. - Hold home meds for now - CBGs AC/HS - SSI PRN  GERD - Continue PPI  Best practice   Diet:  Oral Diabetic Diet Pain/Anxiety/Delirium protocol (if indicated): No VAP protocol (if indicated): Not indicated DVT prophylaxis: Systemic AC GI prophylaxis: PPI Glucose control:  SSI Yes Central venous access:  N/A Arterial line:  N/A Foley:  N/A Mobility:  bed rest  PT consulted: N/A Last date of multidisciplinary goals of  care discussion: Pending  Code Status:  full code Disposition: ICU   Labs   CBC: Recent Labs  Lab 10/09/20 1543  WBC 11.5*  NEUTROABS 7.8*  HGB 15.8  HCT 46.8  MCV 91.6  PLT 125*    Basic Metabolic Panel: No results for input(s): NA, K, CL, CO2, GLUCOSE, BUN, CREATININE, CALCIUM, MG, PHOS in the last 168 hours. GFR: CrCl cannot be calculated (Patient's most recent lab result is older than the maximum 21 days allowed.). Recent Labs  Lab 10/09/20 1543  WBC 11.5*    Liver Function Tests: No results for input(s): AST, ALT, ALKPHOS, BILITOT, PROT, ALBUMIN in the last 168 hours. No results for input(s): LIPASE, AMYLASE in the last 168 hours. No results for input(s): AMMONIA in the last 168 hours.  ABG    Component Value Date/Time   TCO2 23 02/03/2013 1610     Coagulation Profile: No results for input(s): INR, PROTIME in the last 168 hours.  Cardiac Enzymes: No results for input(s): CKTOTAL, CKMB, CKMBINDEX, TROPONINI in the last 168 hours.  HbA1C: Hgb A1c MFr Bld  Date/Time Value Ref Range Status  03/14/2012 05:44 PM 6.3 (H) <5.7 % Final    Comment:    (NOTE)  According to the ADA Clinical Practice Recommendations for 2011, when HbA1c is used as a screening test:  >=6.5%   Diagnostic of Diabetes Mellitus           (if abnormal result is confirmed) 5.7-6.4%   Increased risk of developing Diabetes Mellitus References:Diagnosis and Classification of Diabetes Mellitus,Diabetes [id number],8088,11(SRPRX 1):S62-S69 and Standards of Medical Care in         Diabetes - 2011,Diabetes YVOP,9292,44 (Suppl 1):S11-S61.    CBG: No results for input(s): GLUCAP in the last 168 hours.  Review of Systems: Positive in bold  Gen: Denies fever, chills, weight change, fatigue, night sweats HEENT: Denies blurred vision, double vision, hearing loss, tinnitus, sinus congestion, rhinorrhea, sore throat, neck stiffness,  dysphagia PULM: Denies shortness of breath, cough, sputum production, hemoptysis, wheezing CV: Denies chest pain, edema, orthopnea, paroxysmal nocturnal dyspnea, palpitations GI: Denies abdominal pain, nausea, vomiting, diarrhea, hematochezia, melena, constipation, change in bowel habits GU: Denies dysuria, hematuria, polyuria, oliguria, urethral discharge Endocrine: Denies hot or cold intolerance, polyuria, polyphagia or appetite change Derm: Denies rash, dry skin, scaling or peeling skin change Heme: Denies easy bruising, bleeding, bleeding gums Neuro: Denies headache, numbness, weakness, slurred speech, loss of memory or Syncope   Past Medical History:  He,  has a past medical history of DM (diabetes mellitus) ([place]), GERD (gastroesophageal reflux disease), Hb-AS genotype ([place]), Hepatitis C, IBS (irritable bowel syndrome), Insomnia, Kidney stone, and NASH (nonalcoholic steatohepatitis).   Surgical History:   Past Surgical History:  Procedure Laterality Date  . BACK SURGERY    . CHOLECYSTECTOMY    . COLONOSCOPY  02/05/02   anal canal herrhoids ow normal/normal ileum/suspect pt bled from hemorrhoids presently  . COLONOSCOPY  09/27/2011   QKM:MNOTRRNH hemorrhoids. Hepatic flexure polyp-removed TUBULAR ADENOMA, due for surveillance March 2016  . COLONOSCOPY N/A 01/20/2015   AFB:XUXYBF colonic polyp removed/colonic diverticulosis  . COLONOSCOPY N/A 07/05/2020   Procedure: COLONOSCOPY;  Surgeon: [name], MD;  Location: [facility] END[facility];  Service: Endoscopy;  Laterality: N/A;  12:00  . CYSTOSCOPY W/ URETERAL STENT PLACEMENT Left 02/03/2013   Procedure: CYSTOSCOPY WITH RETROGRADE PYELOGRAM/URETERAL STENT PLACEMENT;  Surgeon: Marissa Nestle, MD;  Location: [facility] ORS;  Service: Urology;  Laterality: Left;  . ESOPHAGOGASTRODUODENOSCOPY  05/17/05   normal esophagus/gastric mucosa appeared normal  . ESOPHAGOGASTRODUODENOSCOPY N/A 04/25/2014   Abnormal gastric mucosa, benign biopsies. No  esophageal varices. Next EGD October 2018.  [name] ESOPHAGOGASTRODUODENOSCOPY (EGD) WITH PROPOFOL N/A 01/08/2018   Procedure: ESOPHAGOGASTRODUODENOSCOPY (EGD) WITH PROPOFOL;  Surgeon: [name], MD;  Location: [facility];  Service: Endoscopy;  Laterality: N/A;  7:30am  . EXTRACORPOREAL SHOCK WAVE LITHOTRIPSY Left 02/03/2013   Procedure: EXTRACORPOREAL SHOCK WAVE LITHOTRIPSY (ESWL) LEFT RENAL CALCULUS;  Surgeon: Marissa Nestle, MD;  Location: [facility] ORS;  Service: Urology;  Laterality: Left;  . KIDNEY STONE SURGERY    . MALONEY DILATION N/A 01/08/2018   Procedure: Venia Minks DILATION;  Surgeon: [name], MD;  Location: [facility];  Service: Endoscopy;  Laterality: N/A;  . POLYPECTOMY  07/05/2020   Procedure: POLYPECTOMY;  Surgeon: [name], MD;  Location: [facility];  Service: Endoscopy;;  recto-sigmoid   . SHOULDER SURGERY       Social History:   reports that he quit smoking about 25 years ago. His smoking use included cigarettes. He has a 25.00 pack-year smoking history. He quit smokeless tobacco use about 28 years ago. He reports previous alcohol use. He reports that he does not use drugs.  Family History:  His family history includes Liver disease in his father. There is no history of Colon cancer.   Allergies No Known Allergies   Home Medications  Prior to Admission medications   Medication Sig Start Date End Date Taking? Authorizing Provider  glimepiride (AMARYL) 4 MG tablet Take 4 mg by mouth daily with breakfast.    [provider]  JANUMET 50-1000 MG tablet Take 1 tablet by mouth 2 (two) times daily. 04/11/20   [provider]  omeprazole (PRILOSEC) 40 MG capsule Take 1 capsule by mouth once daily Patient taking differently: Take 40 mg by mouth daily. 01/18/20   [name], NP  pantoprazole (PROTONIX) 40 MG tablet Take 1 tablet (40 mg total) by mouth daily. 10/05/20   Carlis Stable, NP  potassium citrate (UROCIT-K) 10 MEQ (1080 MG) SR tablet  Take 10 mEq by mouth 2 (two) times daily.     [provider]     Critical care time: 67 minutes   Lestine Mount, PA-C [facility] 10/09/20 10:43 PM  Please see [email or web address] for pager details.

## 2020-10-09 NOTE — Progress Notes (Signed)
Critical care attending attestation note:  Patient seen and examined and relevant ancillary tests reviewed.  I agree with the assessment and plan of care as outlined by Merlene Laughter, NP. This patient was not seen as a shared visit. The following reflects my independent critical care time.   Synopsis of assessment and plan:  70 year old man discussed over the phone with Dr Sabra Heck at [facility] ED, chart reviewed including relevant imaging.   2 syncopal episodes. Presented with severe SOB and tachycardia. CT angio chest personally reviewed showing bilateral proximal PE with RV strain and contrast reflux into IVC.  CT head shows no acute injury.   By report from Dr Sabra Heck, patient remains tachycardic on 5lpm with SBP now down to 93.    I discussed the case with Drs Lake Bells and Carlis Abbott who concurred that the patient represented a very high risk for decline to overt cardiogenic shock and death within the next few hours. We recommended to continue anticoagulation and administer 50 mg of TPA over 2h. We opted for the 1/2 dose of lytics given the recent syncopal episode.  Patient was accepted in transfer.     Kipp Brood, MD [facility] ICU Physician [place]  Pager: [phone] Mobile: [phone] After hours: [phone].  10/09/2020, 4:27 PM

## 2020-10-09 NOTE — ED Triage Notes (Signed)
Pt brought to ED via [facility] for LOC x 2 today. Pt O2 sat by EMS 76%. Pt c/o pain in left calf.

## 2020-10-09 NOTE — ED Notes (Signed)
Troponin on 249 reported to EDP Miller at [facility]. No new orders at this time.

## 2020-10-09 NOTE — Progress Notes (Signed)
ANTICOAGULATION CONSULT NOTE - Initial Consult  Pharmacy Consult for heparin Indication: pulmonary embolus  No Known Allergies  Patient Measurements: Height: 6' 1"  (185.4 cm) Weight: 96.2 kg (212 lb) IBW/kg (Calculated) : 79.9 HEPARIN DW (KG): 96.2  Vital Signs: BP: 112/72 (03/28 1540) Pulse Rate: 111 (03/28 1540)  Labs: Recent Labs    10/09/20 1543  HGB 15.8  HCT 46.8  PLT 125*    CrCl cannot be calculated (Patient's most recent lab result is older than the maximum 21 days allowed.).   Medical History: Past Medical History:  Diagnosis Date  . DM (diabetes mellitus) ([place])    type II  . GERD (gastroesophageal reflux disease)   . Hb-AS genotype ([place])   . Hepatitis C    genotype 1 eradicated  . IBS (irritable bowel syndrome)   . Insomnia   . Kidney stone   . NASH (nonalcoholic steatohepatitis)    Metavir F3, 10/2013, patient reports completing hepatitis a and B vaccination. 2016    Medications:  See med rec  Assessment: Patient presented to ED due to syncopal episodes. He has some chest pain and was worse with taking deep breaths. In addition, he is having pain in the left calf. Concerned with PE, pharmacy asked to start heparin. Patient not on oral anticoagulants PTA.  Goal of Therapy:  INR 2-3 Monitor platelets by anticoagulation protocol: Yes   Plan:  Give 5000 units bolus x 1 Start heparin infusion at 1500 units/hr Check anti-Xa level in ~8 hours and daily while on heparin Continue to monitor H&H and platelets  Isac Sarna, BS [place], BCPS Clinical Pharmacist Pager [phone] 10/09/2020,3:54 PM

## 2020-10-09 NOTE — Progress Notes (Signed)
ANTICOAGULATION CONSULT NOTE - Initial Consult  Pharmacy Consult for heparin Indication: pulmonary embolus  No Known Allergies  Patient Measurements: Height: 6' 1"  (185.4 cm) Weight: 96.2 kg (212 lb) IBW/kg (Calculated) : 79.9 HEPARIN DW (KG): 96.2  Vital Signs: Temp: 98 F (36.7 C) (03/28 2000) Temp Source: Rectal (03/28 1659) BP: 152/98 (03/28 2130) Pulse Rate: 105 (03/28 2130)  Labs: Recent Labs    10/09/20 1524 10/09/20 1543 10/09/20 1750 10/09/20 2030  HGB  --  15.8  --   --   HCT  --  46.8  --   --   PLT  --  125*  --   --   APTT 29  --   --  62*  LABPROT 14.1  --   --   --   INR 1.1  --   --   --   CREATININE  --  1.47*  --   --   TROPONINIHS  --  32* 249*  --     Estimated Creatinine Clearance: 56.3 mL/min (A) (by C-G formula based on SCr of 1.47 mg/dL (H)).   Medical History: Past Medical History:  Diagnosis Date  . DM (diabetes mellitus) ([place])    type II  . GERD (gastroesophageal reflux disease)   . Hb-AS genotype ([place])   . Hepatitis C    genotype 1 eradicated  . IBS (irritable bowel syndrome)   . Insomnia   . Kidney stone   . NASH (nonalcoholic steatohepatitis)    Metavir F3, 10/2013, patient reports completing hepatitis a and B vaccination. 2016    Medications:  See med rec  Assessment: 70 yo M with acute BL occlusive PE with severe clot burden and R heart strain s/p tPA. Pharmacy consulted for heparin when aPTT < 80. Patient not on oral anticoagulants PTA. Baseline INR 1.1, aPTT 29.  TPA 88m given at 17:30. APTT at 20:30 is 62. Admit H/H wnl, plt slightly low at 129. Will start heparin with no bolus.   Goal of Therapy:  Heparin 0.3- 0.5 until 3/29 21:00 then increase to 0.3 -0.7 units/ml Monitor platelets by anticoagulation protocol: Yes   Plan:  Start heparin 1300 units/hr Monitor daily HL, CBC/plt  Monitor for signs/symptoms of bleeding    LBenetta Spar PharmD, BCPS, BCCP Clinical Pharmacist  Please check AMION for all MClymanphone numbers After 10:00 PM, call [phone]

## 2020-10-09 NOTE — ED Notes (Signed)
Unable to get temp at this time. Pt went to CT.

## 2020-10-10 ENCOUNTER — Inpatient Hospital Stay (HOSPITAL_COMMUNITY): Payer: PPO

## 2020-10-10 DIAGNOSIS — I2699 Other pulmonary embolism without acute cor pulmonale: Secondary | ICD-10-CM

## 2020-10-10 DIAGNOSIS — I2609 Other pulmonary embolism with acute cor pulmonale: Secondary | ICD-10-CM

## 2020-10-10 DIAGNOSIS — J9601 Acute respiratory failure with hypoxia: Secondary | ICD-10-CM

## 2020-10-10 DIAGNOSIS — M25511 Pain in right shoulder: Secondary | ICD-10-CM

## 2020-10-10 DIAGNOSIS — I2602 Saddle embolus of pulmonary artery with acute cor pulmonale: Secondary | ICD-10-CM

## 2020-10-10 LAB — HEMOGLOBIN A1C
Hgb A1c MFr Bld: 7.4 % — ABNORMAL HIGH (ref 4.8–5.6)
Mean Plasma Glucose: 165.68 mg/dL

## 2020-10-10 LAB — GLUCOSE, CAPILLARY
Glucose-Capillary: 250 mg/dL — ABNORMAL HIGH (ref 70–99)
Glucose-Capillary: 308 mg/dL — ABNORMAL HIGH (ref 70–99)
Glucose-Capillary: 239 mg/dL — ABNORMAL HIGH (ref 70–99)
Glucose-Capillary: 243 mg/dL — ABNORMAL HIGH (ref 70–99)

## 2020-10-10 LAB — HEPARIN LEVEL (UNFRACTIONATED)
Heparin Unfractionated: 0.15 IU/mL — ABNORMAL LOW (ref 0.30–0.70)
Heparin Unfractionated: 0.16 IU/mL — ABNORMAL LOW (ref 0.30–0.70)

## 2020-10-10 LAB — BASIC METABOLIC PANEL
Anion gap: 9 (ref 5–15)
BUN: 18 mg/dL (ref 8–23)
CO2: 20 mmol/L — ABNORMAL LOW (ref 22–32)
Calcium: 9.2 mg/dL (ref 8.9–10.3)
Chloride: 107 mmol/L (ref 98–111)
Creatinine, Ser: 1 mg/dL (ref 0.61–1.24)
GFR, Estimated: >60 mL/min (ref >60)
Glucose, Bld: 214 mg/dL — ABNORMAL HIGH (ref 70–99)
Potassium: 3.6 mmol/L (ref 3.5–5.1)
Sodium: 136 mmol/L (ref 135–145)

## 2020-10-10 LAB — CBC
HCT: 41.5 % (ref 39.0–52.0)
HCT: 42.6 % (ref 39.0–52.0)
Hemoglobin: 14.8 g/dL (ref 13.0–17.0)
Hemoglobin: 14.9 g/dL (ref 13.0–17.0)
MCH: 31.3 pg (ref 26.0–34.0)
MCH: 31.2 pg (ref 26.0–34.0)
MCHC: 35.7 g/dL (ref 30.0–36.0)
MCHC: 35 g/dL (ref 30.0–36.0)
MCV: 87.7 fL (ref 80.0–100.0)
MCV: 89.1 fL (ref 80.0–100.0)
Platelets: 96 10*3/uL — ABNORMAL LOW (ref 150–400)
Platelets: 94 10*3/uL — ABNORMAL LOW (ref 150–400)
RBC: 4.73 MIL/uL (ref 4.22–5.81)
RBC: 4.78 MIL/uL (ref 4.22–5.81)
RDW: 12.8 % (ref 11.5–15.5)
RDW: 13 % (ref 11.5–15.5)
WBC: 9.1 10*3/uL (ref 4.0–10.5)
WBC: 9.2 10*3/uL (ref 4.0–10.5)
nRBC: 0 % (ref 0.0–0.2)
nRBC: 0 % (ref 0.0–0.2)

## 2020-10-10 LAB — MAGNESIUM: Magnesium: 2.1 mg/dL (ref 1.7–2.4)

## 2020-10-10 LAB — PHOSPHORUS: Phosphorus: 3.8 mg/dL (ref 2.5–4.6)

## 2020-10-10 LAB — MRSA PCR SCREENING: MRSA by PCR: NEGATIVE

## 2020-10-10 LAB — ECHOCARDIOGRAM COMPLETE
AR max vel: 4.07 cm2
AV Area VTI: 3.54 cm2
AV Area mean vel: 3.59 cm2
AV Mean grad: 2 mmHg
AV Peak grad: 4.3 mmHg
Ao pk vel: 1.04 m/s
Area-P 1/2: 2.74 cm2
Height: 73 in
MV VTI: 3.13 cm2
S' Lateral: 2.5 cm
Weight: 3213.42 oz

## 2020-10-10 MED ORDER — MORPHINE SULFATE (PF) 2 MG/ML IV SOLN
4.0000 mg | Freq: Once | INTRAVENOUS | Status: AC
  Administered 2020-10-10: 4 mg via INTRAVENOUS
  Filled 2020-10-10: qty 2

## 2020-10-10 MED ORDER — FLUTICASONE PROPIONATE 50 MCG/ACT NA SUSP
2.0000 | Freq: Every day | NASAL | Status: DC
  Administered 2020-10-10 – 2020-10-12 (×3): 2 via NASAL
  Filled 2020-10-10: qty 16

## 2020-10-10 MED ORDER — CHLORHEXIDINE GLUCONATE CLOTH 2 % EX PADS
6.0000 | MEDICATED_PAD | Freq: Every day | CUTANEOUS | Status: DC
  Administered 2020-10-09 – 2020-10-11 (×3): 6 via TOPICAL

## 2020-10-10 MED ORDER — OXYCODONE HCL 5 MG PO TABS
5.0000 mg | ORAL_TABLET | Freq: Three times a day (TID) | ORAL | Status: DC | PRN
  Administered 2020-10-10 – 2020-10-11 (×4): 5 mg via ORAL
  Filled 2020-10-10 (×4): qty 1

## 2020-10-10 MED ORDER — IRBESARTAN 300 MG PO TABS
300.0000 mg | ORAL_TABLET | Freq: Every day | ORAL | Status: DC
  Administered 2020-10-10 – 2020-10-12 (×3): 300 mg via ORAL
  Filled 2020-10-10 (×3): qty 1

## 2020-10-10 MED ORDER — PANTOPRAZOLE SODIUM 40 MG PO TBEC
40.0000 mg | DELAYED_RELEASE_TABLET | Freq: Every day | ORAL | Status: DC
  Administered 2020-10-10 – 2020-10-12 (×3): 40 mg via ORAL
  Filled 2020-10-10 (×3): qty 1

## 2020-10-10 MED ORDER — ACETAMINOPHEN 325 MG PO TABS
650.0000 mg | ORAL_TABLET | Freq: Four times a day (QID) | ORAL | Status: DC
  Administered 2020-10-10 – 2020-10-12 (×8): 650 mg via ORAL
  Filled 2020-10-10 (×9): qty 2

## 2020-10-10 MED ORDER — INSULIN GLARGINE 100 UNIT/ML ~~LOC~~ SOLN
12.0000 [IU] | Freq: Every day | SUBCUTANEOUS | Status: DC
  Administered 2020-10-10 – 2020-10-12 (×3): 12 [IU] via SUBCUTANEOUS
  Filled 2020-10-10 (×3): qty 0.12

## 2020-10-10 MED ORDER — DOXYCYCLINE HYCLATE 100 MG PO TABS
100.0000 mg | ORAL_TABLET | Freq: Every day | ORAL | Status: DC
  Administered 2020-10-10 – 2020-10-12 (×3): 100 mg via ORAL
  Filled 2020-10-10 (×3): qty 1

## 2020-10-10 NOTE — Progress Notes (Signed)
ANTICOAGULATION CONSULT NOTE - Follow Up Consult  Pharmacy Consult for heparin Indication: pulmonary embolus  No Known Allergies  Patient Measurements: Height: 6' 1"  (185.4 cm) Weight: 91.1 kg (200 lb 13.4 oz) IBW/kg (Calculated) : 79.9 HEPARIN DW (KG): 96.2  Vital Signs: Temp: 97.4 F (36.3 C) (03/29 1600) Temp Source: Oral (03/29 1600) BP: 126/65 (03/29 1600) Pulse Rate: 87 (03/29 1600)  Labs: Recent Labs    10/09/20 1524 10/09/20 1543 10/09/20 1543 10/09/20 1750 10/09/20 2030 10/10/20 0219 10/10/20 0627 10/10/20 0746 10/10/20 1502  HGB  --  15.8   < >  --   --  14.8  --  14.9  --   HCT  --  46.8  --   --   --  41.5  --  42.6  --   PLT  --  125*  --   --   --  96*  --  94*  --   APTT 29  --   --   --  62*  --   --   --   --   LABPROT 14.1  --   --   --   --   --   --   --   --   INR 1.1  --   --   --   --   --   --   --   --   HEPARINUNFRC  --   --   --   --   --   --  0.15*  --  0.16*  CREATININE  --  1.47*  --   --   --  1.00  --   --   --   TROPONINIHS  --  32*  --  249*  --   --   --   --   --    < > = values in this interval not displayed.    Estimated Creatinine Clearance: 76.6 mL/min (by C-G formula based on SCr of 1 mg/dL).   Medical History: Past Medical History:  Diagnosis Date  . DM (diabetes mellitus) ([place])    type II  . GERD (gastroesophageal reflux disease)   . Hb-AS genotype ([place])   . Hepatitis C    genotype 1 eradicated  . IBS (irritable bowel syndrome)   . Insomnia   . Kidney stone   . NASH (nonalcoholic steatohepatitis)    Metavir F3, 10/2013, patient reports completing hepatitis a and B vaccination. 2016    Assessment: 70 yo M with acute BL occlusive PE with severe clot burden and R heart strain s/p tPA at ~1731 on 3/28. Pharmacy consulted for heparin when aPTT < 80. Patient not on oral anticoagulants PTA.  Heparin level 0.16 (remains subtherapeutic) only up very slightly with increased heparin drip rate 1450 units/hr. No issues IV  infusion or access. Hgb 14.8. Plt 96. No noted bleeding.   Goal of Therapy:  Heparin 0.3- 0.5 until 3/29 2000 then increase to 0.3 -0.7 units/ml Monitor platelets by anticoagulation protocol: Yes   Plan:  Increase heparin to 1650 units/hr  Check 6 hr heparin level Monitor heparin level, CBC, and s/s of bleeding daily    Bonnita Nasuti Pharm.D. CPP, BCPS Clinical Pharmacist [phone] 10/10/2020 5:21 PM

## 2020-10-10 NOTE — Progress Notes (Addendum)
NAME:  Stephen Stephenson, MRN:  [id number], DOB:  [birth year], LOS: 1 ADMISSION DATE:  10/09/2020, CONSULTATION DATE:  10/10/2020 REFERRING MD:  Dr. Sabra Heck, CHIEF COMPLAINT:  Pulmonary embolism with hemodynamic compromise  History of Present Illness:  Stephen Stephenson presented to the [facility] on 3/28 after two syncopal episodes, one of which caused him to hit his head. He also complained of R-sided anterior chest pain. ED workup revealed large PE with R heart strain. CT Head was negative for intracranial bleed and he was treated with 1/2 dose TPA and started on heparin gtt. He was transferred to the [facility] ICU due to hemodynamic instability that is much improved today.   Pertinent  Medical History  - DM2, HTN, NASH, hep C, IBS  Significant Hospital Events: Including procedures, antibiotic start and stop dates in addition to other pertinent events    3/28 - Presented to [facility] for syncope, chest pain, and left calf pain, found to have large PE. Half dose tPA given at [facility] with transfer to [facility] placed. Arrived at [facility] ~2230, improved hemodynamics; Echo/Dopplers ordered   Interim History / Subjective:  Stephen Stephenson reports feeling better this morning. His primary complaint today is R-sided shoulder pain. He believes he fell on this shoulder during one of his syncopal events yesterday. He has a history of unknown surgery on the shoulder at age 64 and says that it "causes problems" intermittently, but that the present pain is unlike what he has experienced in the past.  He denies any changes in strength or sensation since administration of TPA yesterday.   Objective   Blood pressure (!) 133/92, pulse 88, temperature 97.6 F (36.4 C), temperature source Oral, resp. rate 16, height 6' 1"  (1.854 m), weight 91.1 kg, SpO2 93 %.        Intake/Output Summary (Last 24 hours) at 10/10/2020 0727 Last data filed at 10/10/2020 0600 Gross per 24 hour  Intake 89.96 ml  Output 1300 ml  Net -1210.04 ml   Filed Weights   10/09/20  1541 10/10/20 0500  Weight: 96.2 kg 91.1 kg   Physical Exam Vitals reviewed.  Constitutional:      General: He is not in acute distress.    Appearance: Normal appearance.  HENT:     Head: No raccoon eyes, masses or laceration.     Comments: Small abrasion with bruising over L eye.  Cardiovascular:     Rate and Rhythm: Normal rate and regular rhythm.     Heart sounds: Normal heart sounds. No murmur heard. No friction rub. No gallop.   Pulmonary:     Comments: Normal WOB and breath sounds. Arrest of deep inspiration.  R anterior chest wall tenderness.  Abdominal:     General: Bowel sounds are normal. There is no distension.     Tenderness: There is no abdominal tenderness.  Musculoskeletal:     Left shoulder: Normal. No swelling or deformity.     Comments: R shoulder with full range of motion, but with diffuse tenderness. No gross deformity or crepitus. Old surgical scar present anteriorly.    Neurological:     Mental Status: He is alert.     Cranial Nerves: Cranial nerves are intact.     Sensory: Sensation is intact.     Motor: Motor function is intact.     Labs/imaging that I havepersonally reviewed  (right click and "Reselect all SmartList Selections" daily)   3/28 CTA chest with bilateral occlusive PE with severe clot burden and evidence  of right heart strain, with a RV/LV ratio of 2.78  3/28 Head CT negative   3/28 12-Lead EKG with tachycardia and RBBB that has been present on previous EKG's  3/28 Troponins 32>249 in the setting of known R heart strain 2/2 large PE  3/29 am labs without gross electrolyte abnormalities   Resolved Hospital Problem list     Assessment & Plan:  Acute pulmonary embolism with hemodynamic compromise and right heart strain S/p TPA half dose 3/28 @ 1730. Breathing comfortably on RA. Hemodynamically improved compared to previous.  No active CA concerns on ROS. Colonoscopy in December with one hyperplastic polyp.  - Will keep in ICU up to  24hrs post TPA  - Therapeutic anticoagulation with heparin gtt - F/u Echo - F/u LE Dopplers - Plan for transition to oral anticoagulation tomorrow  Syncope with brief LOC R Shoulder pain- acute on chronic Feels well this morning, no residual neuro deficits. R shoulder with intact ROM but diffuse tenderness and pain at rest. Xray with diffuse OA but no acute dislocation or fracture.  - Pain control with scheduled tylenol + Oxy PRN - F/u Echo  - Continue to monitor mental status closely with TPA administration in the setting of fall with syncopal episode   History of Hepatitis C NASH History of Hep C treated in 90s with SVR. - Outpatient follow-up with surveillance for [facility]  History of hypertension Recently started on antihypertensives at home (olmesartan 48m daily). BPs uptrending today.   - Will restart home ARB (olmesartan not on formulary, will start Irbesartan at dose equivalent)  - Goal MAP > 65  Type 2 diabetes  Home meds include Amaryl and Janumet, glucoses well-controlled at home per patient report. - Hold home meds for now - CBGs AC/HS - SSI PRN  GERD - Continue PPI  Best practice (right click and "Reselect all SmartList Selections" daily)  Diet:  Oral Pain/Anxiety/Delirium protocol (if indicated): No VAP protocol (if indicated): Not indicated DVT prophylaxis: Systemic AC GI prophylaxis: PPI Glucose control:  SSI Yes Central venous access:  N/A Arterial line:  N/A Foley:  N/A Mobility:  bed rest  PT consulted: N/A Last date of multidisciplinary goals of care discussion: Pending Code Status:  full code Disposition: Plan for transfer to floor after 24 hours  Labs   CBC: Recent Labs  Lab 10/09/20 1543  WBC 11.5*  NEUTROABS 7.8*  HGB 15.8  HCT 46.8  MCV 91.6  PLT 125*    Basic Metabolic Panel: Recent Labs  Lab 10/09/20 1543 10/10/20 0219  NA 133* 136  K 3.7 3.6  CL 99 107  CO2 20* 20*  GLUCOSE 326* 214*  BUN 22 18  CREATININE 1.47* 1.00   CALCIUM 9.5 9.2  MG  --  2.1  PHOS  --  3.8   GFR: Estimated Creatinine Clearance: 76.6 mL/min (by C-G formula based on SCr of 1 mg/dL). Recent Labs  Lab 10/09/20 1543 10/09/20 1750  WBC 11.5*  --   LATICACIDVEN  --  2.0*    Liver Function Tests: Recent Labs  Lab 10/09/20 1543  AST 26  ALT 26  ALKPHOS 38  BILITOT 1.0  PROT 7.2  ALBUMIN 3.6   No results for input(s): LIPASE, AMYLASE in the last 168 hours. No results for input(s): AMMONIA in the last 168 hours.  ABG    Component Value Date/Time   TCO2 23 02/03/2013 1610     Coagulation Profile: Recent Labs  Lab 10/09/20 1524  INR 1.1  Cardiac Enzymes: No results for input(s): CKTOTAL, CKMB, CKMBINDEX, TROPONINI in the last 168 hours.  HbA1C: Hgb A1c MFr Bld  Date/Time Value Ref Range Status  10/10/2020 02:19 AM 7.4 (H) 4.8 - 5.6 % Final    Comment:    (NOTE) Pre diabetes:          5.7%-6.4%  Diabetes:              >6.4%  Glycemic control for   <7.0% adults with diabetes   03/14/2012 05:44 PM 6.3 (H) <5.7 % Final    Comment:    (NOTE)                                                                       According to the ADA Clinical Practice Recommendations for 2011, when HbA1c is used as a screening test:  >=6.5%   Diagnostic of Diabetes Mellitus           (if abnormal result is confirmed) 5.7-6.4%   Increased risk of developing Diabetes Mellitus References:Diagnosis and Classification of Diabetes Mellitus,Diabetes GYKZ,9935,70(VXBLT 1):S62-S69 and Standards of Medical Care in         Diabetes - 2011,Diabetes JQZE,0923,30 (Suppl 1):S11-S61.    CBG: Recent Labs  Lab 10/09/20 2223 10/10/20 0707  GLUCAP 189* 250*    Review of Systems:   Review of Systems  Constitutional: Negative for chills, diaphoresis and fever.  HENT: Negative.   Eyes: Negative for blurred vision and double vision.  Respiratory: Negative for shortness of breath.   Cardiovascular: Positive for chest pain and leg  swelling. Negative for palpitations.  Gastrointestinal: Negative for abdominal pain, diarrhea and nausea.  Genitourinary: Negative.   Musculoskeletal: Positive for falls and joint pain. Negative for neck pain.  Skin: Negative.   Neurological: Negative for dizziness, sensory change, weakness and headaches.  Endo/Heme/Allergies: Does not bruise/bleed easily.  Psychiatric/Behavioral: Negative.      Past Medical History:  He,  has a past medical history of DM (diabetes mellitus) ([place]), GERD (gastroesophageal reflux disease), Hb-AS genotype ([place]), Hepatitis C, IBS (irritable bowel syndrome), Insomnia, Kidney stone, and NASH (nonalcoholic steatohepatitis).   Surgical History:   Past Surgical History:  Procedure Laterality Date  . BACK SURGERY    . CHOLECYSTECTOMY    . COLONOSCOPY  02/05/02   anal canal herrhoids ow normal/normal ileum/suspect pt bled from hemorrhoids presently  . COLONOSCOPY  09/27/2011   QTM:AUQJFHLK hemorrhoids. Hepatic flexure polyp-removed TUBULAR ADENOMA, due for surveillance March 2016  . COLONOSCOPY N/A 01/20/2015   TGY:BWLSLH colonic polyp removed/colonic diverticulosis  . COLONOSCOPY N/A 07/05/2020   Procedure: COLONOSCOPY;  Surgeon: [name], MD;  Location: [facility] END[facility];  Service: Endoscopy;  Laterality: N/A;  12:00  . CYSTOSCOPY W/ URETERAL STENT PLACEMENT Left 02/03/2013   Procedure: CYSTOSCOPY WITH RETROGRADE PYELOGRAM/URETERAL STENT PLACEMENT;  Surgeon: Marissa Nestle, MD;  Location: [facility] ORS;  Service: Urology;  Laterality: Left;  . ESOPHAGOGASTRODUODENOSCOPY  05/17/05   normal esophagus/gastric mucosa appeared normal  . ESOPHAGOGASTRODUODENOSCOPY N/A 04/25/2014   Abnormal gastric mucosa, benign biopsies. No esophageal varices. Next EGD October 2018.  [name] ESOPHAGOGASTRODUODENOSCOPY (EGD) WITH PROPOFOL N/A 01/08/2018   Procedure: ESOPHAGOGASTRODUODENOSCOPY (EGD) WITH PROPOFOL;  Surgeon: [name], MD;  Location: [facility];  Service: Endoscopy;   Laterality: N/A;  7:30am  . EXTRACORPOREAL SHOCK WAVE LITHOTRIPSY Left 02/03/2013   Procedure: EXTRACORPOREAL SHOCK WAVE LITHOTRIPSY (ESWL) LEFT RENAL CALCULUS;  Surgeon: Marissa Nestle, MD;  Location: [facility] ORS;  Service: Urology;  Laterality: Left;  . KIDNEY STONE SURGERY    . MALONEY DILATION N/A 01/08/2018   Procedure: Venia Minks DILATION;  Surgeon: [name], MD;  Location: [facility];  Service: Endoscopy;  Laterality: N/A;  . POLYPECTOMY  07/05/2020   Procedure: POLYPECTOMY;  Surgeon: [name], MD;  Location: [facility];  Service: Endoscopy;;  recto-sigmoid   . SHOULDER SURGERY       Social History:   reports that he quit smoking about 25 years ago. His smoking use included cigarettes. He has a 25.00 pack-year smoking history. He quit smokeless tobacco use about 28 years ago. He reports previous alcohol use. He reports that he does not use drugs.   Family History:  His family history includes Liver disease in his father. There is no history of Colon cancer.   Allergies No Known Allergies   Home Medications  Prior to Admission medications   Medication Sig Start Date End Date Taking? Authorizing Provider  doxycycline (VIBRA-TABS) 100 MG tablet Take 100 mg by mouth daily. Starting 3.16.22 x 30 days for eyes.   Yes [provider]  erythromycin ophthalmic ointment Place 1 application into both eyes at bedtime. Staring 3.16.22 for eyes.   Yes [provider]  glimepiride (AMARYL) 4 MG tablet Take 4 mg by mouth daily with breakfast.   Yes [provider]  JANUMET 50-1000 MG tablet Take 1 tablet by mouth 2 (two) times daily. 04/11/20  Yes [provider]  olmesartan (BENICAR) 40 MG tablet Take 40 mg by mouth daily. 08/23/20  Yes [provider]  pantoprazole (PROTONIX) 40 MG tablet Take 1 tablet (40 mg total) by mouth daily. 10/05/20  Yes Carlis Stable, NP  potassium citrate (UROCIT-K) 10 MEQ (1080 MG) SR tablet Take 10 mEq by mouth 2  (two) times daily.    Yes [provider]  triamcinolone (KENALOG) 0.1 % Apply 1 application topically 2 (two) times daily. APPLY CREAM EXTERNALLY TWICE DAILY FOR UP TO 2 CONTINUOUS WEEKS THEN TAKE A 2 WEEK BREAK BEFORE RESTARTING. 08/28/20  Yes [provider]  omeprazole (PRILOSEC) 40 MG capsule Take 1 capsule by mouth once daily Patient not taking: No sig reported 01/18/20   [name], NP     Critical care time: 35 min

## 2020-10-10 NOTE — Progress Notes (Signed)
Bilateral lower extremity venous duplex complete.  Critical findings reported to RN at bedside during exam. Please see CV proc tab for preliminary results. Lita Mains- RDMS, RVT 4:57 PM  10/10/2020

## 2020-10-10 NOTE — Progress Notes (Signed)
[name] DOB: February 09, 1951 MRN: [id number]   Date of Service  10/10/2020  HPI/Events of Note  Patient states that he suffers from allergies/feels stuffy and is asking for Flonase.  eICU Interventions  Plan: 1. Flonase 2 sprays in each nostril daily.      Intervention Category Major Interventions: Other:  Sheyla Zaffino Cornelia Copa 10/10/2020, 6:09 AM

## 2020-10-10 NOTE — Progress Notes (Signed)
Echocardiogram 2D Echocardiogram has been performed.  Stephen Stephenson 10/10/2020, 4:04 PM

## 2020-10-10 NOTE — Progress Notes (Signed)
[place] Progress Note Patient Name: TREVONTE ASHKAR DOB: Nov 06, 1950 MRN: [id number]   Date of Service  10/10/2020  HPI/Events of Note  Review of EKG reveals: Normal sinus rhythm. Left axis deviation. Right bundle branch block.  eICU Interventions  Continue present management.      Intervention Category Major Interventions: Other:  Savalas Monje Cornelia Copa 10/10/2020, 2:16 AM

## 2020-10-10 NOTE — Progress Notes (Addendum)
ANTICOAGULATION CONSULT NOTE - Follow Up Consult  Pharmacy Consult for heparin Indication: pulmonary embolus  No Known Allergies  Patient Measurements: Height: 6' 1"  (185.4 cm) Weight: 91.1 kg (200 lb 13.4 oz) IBW/kg (Calculated) : 79.9 HEPARIN DW (KG): 96.2  Vital Signs: Temp: 97.6 F (36.4 C) (03/29 0717) Temp Source: Oral (03/29 0717) BP: 133/92 (03/29 0600) Pulse Rate: 88 (03/29 0600)  Labs: Recent Labs    10/09/20 1524 10/09/20 1543 10/09/20 1750 10/09/20 2030 10/10/20 0219 10/10/20 0627  HGB  --  15.8  --   --   --   --   HCT  --  46.8  --   --   --   --   PLT  --  125*  --   --   --   --   APTT 29  --   --  62*  --   --   LABPROT 14.1  --   --   --   --   --   INR 1.1  --   --   --   --   --   HEPARINUNFRC  --   --   --   --   --  0.15*  CREATININE  --  1.47*  --   --  1.00  --   TROPONINIHS  --  32* 249*  --   --   --     Estimated Creatinine Clearance: 76.6 mL/min (by C-G formula based on SCr of 1 mg/dL).   Medical History: Past Medical History:  Diagnosis Date  . DM (diabetes mellitus) ([place])    type II  . GERD (gastroesophageal reflux disease)   . Hb-AS genotype ([place])   . Hepatitis C    genotype 1 eradicated  . IBS (irritable bowel syndrome)   . Insomnia   . Kidney stone   . NASH (nonalcoholic steatohepatitis)    Metavir F3, 10/2013, patient reports completing hepatitis a and B vaccination. 2016    Assessment: 70 yo M with acute BL occlusive PE with severe clot burden and R heart strain s/p tPA at ~1731 on 3/28. Pharmacy consulted for heparin when aPTT < 80. Patient not on oral anticoagulants PTA.  Heparin level 0.15 on heparin 1300 units/hr is subtherapeutic. No issues IV infusion or access. Hgb 14.8. Plt 96. No noted bleeding.   Goal of Therapy:  Heparin 0.3- 0.5 until 3/29 2000 then increase to 0.3 -0.7 units/ml Monitor platelets by anticoagulation protocol: Yes   Plan:  Increase heparin to 1450 units/hr  Check 6 hr heparin  level Monitor heparin level, CBC, and s/s of bleeding daily    Cristela Felt, PharmD Clinical Pharmacist

## 2020-10-10 NOTE — TOC Benefit Eligibility Note (Signed)
Transition of Care [facility]) Benefit Eligibility Note    Patient Details  Name: RAMCES SHOMAKER MRN: [id number] Date of Birth: October 02, 1950   Medication/Dose: Arne Cleveland  2.5 MG BID  CO-PAY -$45.00 and  ELIQUIS  5 MG BID  CO-PAY- $45.00   APIXABAN  and  ELIQUIS 10 MG BID : NON-FORMULARY  Covered?: Yes  Tier: 3 Drug  Prescription Coverage Preferred Pharmacy: [facility]  and    [place] with Person/Company/Phone Number:: SUSIE  @  [employer] # [phone]  Co-Pay: $45.00  Prior Approval: No  Deductible:  (NO DEDUCTIBLE WITH PLAN)  Additional Notes: XARELTO 15 MG BID  CO-PAY- $45.00   and  XARELTO  20 MG DAILY CO-PAY-$45.00    RIVAROXABAN : NON-FORMULARY    Memory Argue Phone Number: 10/10/2020, 10:32 AM

## 2020-10-10 NOTE — Plan of Care (Signed)
Problem: Education: Goal: Ability to describe self-care measures that may prevent or decrease complications (Diabetes Survival Skills Education) will improve 10/10/2020 1254 by William Hamburger, RN Outcome: Progressing 10/10/2020 1253 by William Hamburger, RN Outcome: Progressing Goal: Individualized Educational Video(s) 10/10/2020 1254 by William Hamburger, RN Outcome: Progressing 10/10/2020 1253 by William Hamburger, RN Outcome: Progressing   Problem: Metabolic: Goal: Ability to maintain appropriate glucose levels will improve 10/10/2020 1254 by William Hamburger, RN Outcome: Progressing 10/10/2020 1253 by William Hamburger, RN Outcome: Progressing   Problem: Nutritional: Goal: Maintenance of adequate nutrition will improve 10/10/2020 1254 by William Hamburger, RN Outcome: Progressing 10/10/2020 1253 by William Hamburger, RN Outcome: Progressing Goal: Progress toward achieving an optimal weight will improve 10/10/2020 1254 by William Hamburger, RN Outcome: Progressing 10/10/2020 1253 by William Hamburger, RN Outcome: Progressing   Problem: Education: Goal: Knowledge of General Education information will improve Description: Including pain rating scale, medication(s)/side effects and non-pharmacologic comfort measures 10/10/2020 1254 by William Hamburger, RN Outcome: Progressing 10/10/2020 1253 by William Hamburger, RN Outcome: Progressing   Problem: Health Behavior/Discharge Planning: Goal: Ability to manage health-related needs will improve 10/10/2020 1254 by William Hamburger, RN Outcome: Progressing 10/10/2020 1253 by William Hamburger, RN Outcome: Progressing   Problem: Clinical Measurements: Goal: Ability to maintain clinical measurements within normal limits will improve 10/10/2020 1254 by William Hamburger, RN Outcome: Progressing 10/10/2020 1253 by William Hamburger, RN Outcome: Progressing Goal: Will remain free from infection 10/10/2020 1254 by William Hamburger,  RN Outcome: Progressing 10/10/2020 1253 by William Hamburger, RN Outcome: Progressing Goal: Diagnostic test results will improve 10/10/2020 1254 by William Hamburger, RN Outcome: Progressing 10/10/2020 1253 by William Hamburger, RN Outcome: Progressing Goal: Respiratory complications will improve 10/10/2020 1254 by William Hamburger, RN Outcome: Progressing 10/10/2020 1253 by William Hamburger, RN Outcome: Progressing Goal: Cardiovascular complication will be avoided 10/10/2020 1254 by William Hamburger, RN Outcome: Progressing 10/10/2020 1253 by William Hamburger, RN Outcome: Progressing   Problem: Activity: Goal: Risk for activity intolerance will decrease 10/10/2020 1254 by William Hamburger, RN Outcome: Progressing 10/10/2020 1253 by William Hamburger, RN Outcome: Progressing   Problem: Nutrition: Goal: Adequate nutrition will be maintained 10/10/2020 1254 by William Hamburger, RN Outcome: Progressing 10/10/2020 1253 by William Hamburger, RN Outcome: Progressing   Problem: Coping: Goal: Level of anxiety will decrease 10/10/2020 1254 by William Hamburger, RN Outcome: Progressing 10/10/2020 1253 by William Hamburger, RN Outcome: Progressing   Problem: Elimination: Goal: Will not experience complications related to bowel motility 10/10/2020 1254 by William Hamburger, RN Outcome: Progressing 10/10/2020 1253 by William Hamburger, RN Outcome: Progressing Goal: Will not experience complications related to urinary retention 10/10/2020 1254 by William Hamburger, RN Outcome: Progressing 10/10/2020 1253 by William Hamburger, RN Outcome: Progressing   Problem: Pain Managment: Goal: General experience of comfort will improve 10/10/2020 1254 by William Hamburger, RN Outcome: Progressing 10/10/2020 1253 by William Hamburger, RN Outcome: Progressing   Problem: Safety: Goal: Ability to remain free from injury will improve 10/10/2020 1254 by William Hamburger, RN Outcome:  Progressing 10/10/2020 1253 by William Hamburger, RN Outcome: Progressing   Problem: Skin Integrity: Goal: Risk for impaired skin integrity will decrease 10/10/2020 1254 by William Hamburger, RN Outcome: Progressing 10/10/2020 1253 by William Hamburger, RN Outcome: Progressing   Problem: Consults Goal: Venous Thromboembolism Patient Education Description: See Patient Education Module  for education specifics. Outcome: Progressing Goal: Diagnosis - Venous Thromboembolism (VTE) Description: Choose a selection Outcome: Progressing Goal: Pharmacy Consult for anticoagulation Outcome: Progressing Goal: Skin Care Protocol Initiated - if Braden Score 18 or less Description: If consults are not indicated, leave blank or document N/A Outcome: Progressing Goal: Nutrition Consult-if indicated Outcome: Progressing Goal: Diabetes Guidelines if Diabetic/Glucose > 140 Description: If diabetic or lab glucose is > 140 mg/dl - Initiate Diabetes/Hyperglycemia Guidelines & Document Interventions  Outcome: Progressing   Problem: Phase I Progression Outcomes Goal: Pain controlled with appropriate interventions Outcome: Progressing Goal: Dyspnea controlled at rest (PE) Outcome: Progressing Goal: Tolerating diet Outcome: Progressing Goal: Initial discharge plan identified Outcome: Progressing Goal: Voiding-avoid urinary catheter unless indicated Outcome: Progressing Goal: Hemodynamically stable Outcome: Progressing Goal: Other Phase I Outcomes/Goals Outcome: Progressing   Problem: Phase II Progression Outcomes Goal: Therapeutic drug levels for anticoagulation Outcome: Progressing Goal: 02 sats trending upward/stable (PE) Outcome: Progressing Goal: Discharge plan established Outcome: Progressing Goal: Tolerating diet Outcome: Progressing Goal: Other Phase II Outcomes/Goals Outcome: Progressing   Problem: Phase III Progression Outcomes Goal: 02 sats stabilized Outcome: Progressing Goal:  Activity at appropriate level-compared to baseline Description: (UP IN CHAIR FOR HEMODIALYSIS) Outcome: Progressing Goal: Discharge plan remains appropriate-arrangements made Outcome: Progressing Goal: Other Phase III Outcomes/Goals Outcome: Progressing   Problem: Discharge Progression Outcomes Goal: Barriers To Progression Addressed/Resolved Outcome: Progressing Goal: Discharge plan in place and appropriate Outcome: Progressing Goal: Pain controlled with appropriate interventions Outcome: Progressing Goal: Hemodynamically stable Outcome: Progressing Goal: Complications resolved/controlled Outcome: Progressing Goal: Tolerating diet Outcome: Progressing Goal: Activity appropriate for discharge plan Outcome: Progressing Goal: Other Discharge Outcomes/Goals Outcome: Progressing

## 2020-10-10 NOTE — Progress Notes (Signed)
Benefits check in process : Please check copay: apixaban 71m twice a day x 7 days followed by 533mtwice a day, and rivaroxaban 1525mwice daily x15 days followed by 27m56mily. Thanks! AngeWhitman HeroBSN,CM [phone]

## 2020-10-10 NOTE — Progress Notes (Signed)
[place] Progress Note Patient Name: Stephen Stephenson DOB: [birth year] MRN: [id number]   Date of Service  10/10/2020  HPI/Events of Note  Patient c/o R shoulder pain.   eICU Interventions  Plan: 1. Morphine 4 mg IV X 1 now. 2. Repeat EKG now.      Intervention Category Major Interventions: Other:  Stephen Stephenson 10/10/2020, 1:45 AM

## 2020-10-11 ENCOUNTER — Telehealth: Payer: Self-pay | Admitting: Critical Care Medicine

## 2020-10-11 DIAGNOSIS — E7849 Other hyperlipidemia: Secondary | ICD-10-CM | POA: Diagnosis not present

## 2020-10-11 DIAGNOSIS — M25511 Pain in right shoulder: Secondary | ICD-10-CM

## 2020-10-11 DIAGNOSIS — R55 Syncope and collapse: Secondary | ICD-10-CM

## 2020-10-11 DIAGNOSIS — E1165 Type 2 diabetes mellitus with hyperglycemia: Secondary | ICD-10-CM | POA: Diagnosis not present

## 2020-10-11 DIAGNOSIS — G8929 Other chronic pain: Secondary | ICD-10-CM

## 2020-10-11 DIAGNOSIS — I10 Essential (primary) hypertension: Secondary | ICD-10-CM | POA: Diagnosis not present

## 2020-10-11 DIAGNOSIS — I2699 Other pulmonary embolism without acute cor pulmonale: Principal | ICD-10-CM

## 2020-10-11 LAB — BASIC METABOLIC PANEL
Anion gap: 9 (ref 5–15)
BUN: 22 mg/dL (ref 8–23)
CO2: 19 mmol/L — ABNORMAL LOW (ref 22–32)
Calcium: 8.8 mg/dL — ABNORMAL LOW (ref 8.9–10.3)
Chloride: 106 mmol/L (ref 98–111)
Creatinine, Ser: 1.25 mg/dL — ABNORMAL HIGH (ref 0.61–1.24)
GFR, Estimated: >60 mL/min (ref >60)
Glucose, Bld: 187 mg/dL — ABNORMAL HIGH (ref 70–99)
Potassium: 3.9 mmol/L (ref 3.5–5.1)
Sodium: 134 mmol/L — ABNORMAL LOW (ref 135–145)

## 2020-10-11 LAB — CBC
HCT: 40.9 % (ref 39.0–52.0)
Hemoglobin: 14.4 g/dL (ref 13.0–17.0)
MCH: 31.3 pg (ref 26.0–34.0)
MCHC: 35.2 g/dL (ref 30.0–36.0)
MCV: 88.9 fL (ref 80.0–100.0)
Platelets: 103 10*3/uL — ABNORMAL LOW (ref 150–400)
RBC: 4.6 MIL/uL (ref 4.22–5.81)
RDW: 12.8 % (ref 11.5–15.5)
WBC: 6.3 10*3/uL (ref 4.0–10.5)
nRBC: 0 % (ref 0.0–0.2)

## 2020-10-11 LAB — HEPARIN LEVEL (UNFRACTIONATED)
Heparin Unfractionated: 0.17 IU/mL — ABNORMAL LOW (ref 0.30–0.70)
Heparin Unfractionated: <0.1 IU/mL — ABNORMAL LOW (ref 0.30–0.70)

## 2020-10-11 LAB — GLUCOSE, CAPILLARY
Glucose-Capillary: 186 mg/dL — ABNORMAL HIGH (ref 70–99)
Glucose-Capillary: 208 mg/dL — ABNORMAL HIGH (ref 70–99)
Glucose-Capillary: 210 mg/dL — ABNORMAL HIGH (ref 70–99)
Glucose-Capillary: 202 mg/dL — ABNORMAL HIGH (ref 70–99)

## 2020-10-11 MED ORDER — APIXABAN 5 MG PO TABS
10.0000 mg | ORAL_TABLET | Freq: Two times a day (BID) | ORAL | Status: DC
  Administered 2020-10-11 – 2020-10-12 (×3): 10 mg via ORAL
  Filled 2020-10-11 (×3): qty 2

## 2020-10-11 MED ORDER — APIXABAN 5 MG PO TABS: 5.0000 mg | ORAL_TABLET | Freq: Two times a day (BID) | ORAL | Status: DC

## 2020-10-11 NOTE — Plan of Care (Signed)
  Problem: Education: Goal: Ability to describe self-care measures that may prevent or decrease complications (Diabetes Survival Skills Education) will improve Outcome: Progressing Goal: Individualized Educational Video(s) Outcome: Progressing   Problem: Metabolic: Goal: Ability to maintain appropriate glucose levels will improve Outcome: Progressing   Problem: Nutritional: Goal: Maintenance of adequate nutrition will improve Outcome: Progressing Goal: Progress toward achieving an optimal weight will improve Outcome: Progressing   Problem: Education: Goal: Knowledge of General Education information will improve Description: Including pain rating scale, medication(s)/side effects and non-pharmacologic comfort measures Outcome: Progressing   Problem: Health Behavior/Discharge Planning: Goal: Ability to manage health-related needs will improve Outcome: Progressing   Problem: Clinical Measurements: Goal: Ability to maintain clinical measurements within normal limits will improve Outcome: Progressing Goal: Will remain free from infection Outcome: Progressing Goal: Diagnostic test results will improve Outcome: Progressing Goal: Respiratory complications will improve Outcome: Progressing Goal: Cardiovascular complication will be avoided Outcome: Progressing   Problem: Activity: Goal: Risk for activity intolerance will decrease Outcome: Progressing   Problem: Nutrition: Goal: Adequate nutrition will be maintained Outcome: Progressing   Problem: Coping: Goal: Level of anxiety will decrease Outcome: Progressing   Problem: Elimination: Goal: Will not experience complications related to bowel motility Outcome: Progressing Goal: Will not experience complications related to urinary retention Outcome: Progressing   Problem: Pain Managment: Goal: General experience of comfort will improve Outcome: Progressing   Problem: Safety: Goal: Ability to remain free from injury will  improve Outcome: Progressing   Problem: Skin Integrity: Goal: Risk for impaired skin integrity will decrease Outcome: Progressing   Problem: Consults Goal: Venous Thromboembolism Patient Education Description: See Patient Education Module for education specifics. Outcome: Progressing Goal: Diagnosis - Venous Thromboembolism (VTE) Description: Choose a selection Outcome: Progressing Goal: Pharmacy Consult for anticoagulation Outcome: Progressing Goal: Skin Care Protocol Initiated - if Braden Score 18 or less Description: If consults are not indicated, leave blank or document N/A Outcome: Progressing Goal: Nutrition Consult-if indicated Outcome: Progressing Goal: Diabetes Guidelines if Diabetic/Glucose > 140 Description: If diabetic or lab glucose is > 140 mg/dl - Initiate Diabetes/Hyperglycemia Guidelines & Document Interventions  Outcome: Progressing   Problem: Phase I Progression Outcomes Goal: Pain controlled with appropriate interventions Outcome: Progressing Goal: Dyspnea controlled at rest (PE) Outcome: Progressing Goal: Tolerating diet Outcome: Progressing Goal: Initial discharge plan identified Outcome: Progressing Goal: Voiding-avoid urinary catheter unless indicated Outcome: Progressing Goal: Hemodynamically stable Outcome: Progressing Goal: Other Phase I Outcomes/Goals Outcome: Progressing   Problem: Phase II Progression Outcomes Goal: Therapeutic drug levels for anticoagulation Outcome: Progressing Goal: 02 sats trending upward/stable (PE) Outcome: Progressing Goal: Discharge plan established Outcome: Progressing Goal: Tolerating diet Outcome: Progressing Goal: Other Phase II Outcomes/Goals Outcome: Progressing   Problem: Phase III Progression Outcomes Goal: 02 sats stabilized Outcome: Progressing Goal: Activity at appropriate level-compared to baseline Description: (UP IN CHAIR FOR HEMODIALYSIS) Outcome: Progressing Goal: Discharge plan remains  appropriate-arrangements made Outcome: Progressing Goal: Other Phase III Outcomes/Goals Outcome: Progressing   Problem: Discharge Progression Outcomes Goal: Barriers To Progression Addressed/Resolved Outcome: Progressing Goal: Discharge plan in place and appropriate Outcome: Progressing Goal: Pain controlled with appropriate interventions Outcome: Progressing Goal: Hemodynamically stable Outcome: Progressing Goal: Complications resolved/controlled Outcome: Progressing Goal: Tolerating diet Outcome: Progressing Goal: Activity appropriate for discharge plan Outcome: Progressing Goal: Other Discharge Outcomes/Goals Outcome: Progressing

## 2020-10-11 NOTE — Telephone Encounter (Signed)
ATC pt at phone number listed. No message left because unsure if this number is correct number. Will attempt to reach at later time.

## 2020-10-11 NOTE — Progress Notes (Signed)
Patient ambulated around unit five times with no complaints. Oxygen saturation maintained at 93-95 % on RA.

## 2020-10-11 NOTE — Discharge Instructions (Signed)
Information on my medicine - ELIQUIS (apixaban)  Why was Eliquis prescribed for you? Eliquis was prescribed to treat blood clots that may have been found in the veins of your legs (deep vein thrombosis) or in your lungs (pulmonary embolism) and to reduce the risk of them occurring again.  What do You need to know about Eliquis ? The starting dose is 10 mg (two 5 mg tablets) taken TWICE daily for the FIRST SEVEN (7) DAYS, then on 10/21/20  the dose is reduced to ONE 5 mg tablet taken TWICE daily.  Eliquis may be taken with or without food.   Try to take the dose about the same time in the morning and in the evening. If you have difficulty swallowing the tablet whole please discuss with your pharmacist how to take the medication safely.  Take Eliquis exactly as prescribed and DO NOT stop taking Eliquis without talking to the doctor who prescribed the medication.  Stopping may increase your risk of developing a new blood clot.  Refill your prescription before you run out.  After discharge, you should have regular check-up appointments with your healthcare provider that is prescribing your Eliquis.    What do you do if you miss a dose? If a dose of ELIQUIS is not taken at the scheduled time, take it as soon as possible on the same day and twice-daily administration should be resumed. The dose should not be doubled to make up for a missed dose.  Important Safety Information A possible side effect of Eliquis is bleeding. You should call your healthcare provider right away if you experience any of the following: ? Bleeding from an injury or your nose that does not stop. ? Unusual colored urine (red or dark brown) or unusual colored stools (red or black). ? Unusual bruising for unknown reasons. ? A serious fall or if you hit your head (even if there is no bleeding).  Some medicines may interact with Eliquis and might increase your risk of bleeding or clotting while on Eliquis. To help avoid  this, consult your healthcare provider or pharmacist prior to using any new prescription or non-prescription medications, including herbals, vitamins, non-steroidal anti-inflammatory drugs (NSAIDs) and supplements.  This website has more information on Eliquis (apixaban): http://www.eliquis.com/eliquis/home

## 2020-10-11 NOTE — Telephone Encounter (Signed)
Spoke with the pt and scheduled appt for [place] for 11/13/20 at 10:30 am. Pt aware to arrive at 10:15 and is aware of office location.

## 2020-10-11 NOTE — Progress Notes (Signed)
Inpatient Diabetes Program Recommendations  AACE/ADA: New Consensus Statement on Inpatient Glycemic Control (2015)  Target Ranges:  Prepandial:   less than 140 mg/dL      Peak postprandial:   less than 180 mg/dL (1-2 hours)      Critically ill patients:  140 - 180 mg/dL   Lab Results  Component Value Date   GLUCAP 186 (H) 10/11/2020   HGBA1C 7.4 (H) 10/10/2020    Review of Glycemic Control Results for Stephen Stephenson, Stephen Stephenson (MRN [id number]) as of 10/11/2020 09:01  Ref. Range 10/10/2020 07:07 10/10/2020 10:56 10/10/2020 16:17 10/10/2020 21:19 10/11/2020 07:25  Glucose-Capillary Latest Ref Range: 70 - 99 mg/dL 250 (H) 308 (H) 239 (H) 243 (H) 186 (H)   Diabetes history: DM2 Outpatient Diabetes medications: Amaryl 4 mg daily, Janumet 50-1000 mg BID Current orders for Inpatient glycemic control: Lantus 12 daily, Novolog 0-15 TID and 0-5 QHS  Inpatient Diabetes Program Recommendations:     Novolog 2-3 units TID with meals if eats at least 50%.  Will continue to follow while inpatient.  Thank you, Reche Dixon, RN, BSN Diabetes Coordinator Inpatient Diabetes Program [phone] (team pager from 8a-5p)

## 2020-10-11 NOTE — Telephone Encounter (Signed)
Patient needs follow up appointment in 1 month.   Julian Hy, DO 10/11/20 8:29 AM [facility]

## 2020-10-11 NOTE — Progress Notes (Signed)
PROGRESS NOTE  Stephen Stephenson DHR:[id number] DOB: Dec 10, 1950 DOA: 10/09/2020 PCP: Sharilyn Sites, MD  HPI/Recap of past 24 hours: HPI from PCCM Stephen Stephenson presented to [facility] on 3/28 after two syncopal episodes, one of which caused him to hit his head. He also complained of R-sided anterior chest pain. ED workup revealed large PE with R heart strain. CT Head was negative for intracranial bleed and he was treated with 1/2 dose TPA and started on heparin gtt. He was transferred to the [facility] ICU due to hemodynamic instability.    Today, patient reports feeling tired, has not slept in few days. Denies any chest pain, worsening shortness of breath, abdominal pain, nausea/vomiting, fever/chills, N/V, denies any bleeding noted.  Patient able to ambulate the hallway without any issues    Assessment/Plan: Active Problems:   Pulmonary emboli (HCC)   Acute pulmonary embolism (HCC)   Right shoulder pain   Acute submassive PE with hemodynamic compromise/right heart strain on admission LLE DVT S/p TPA on 3/28, currently HD stable Echo showed EF of 60 to 46%, grade 1 diastolic dysfunction LE Doppler showed left popliteal, posterior tibial DVT S/p IV Heparin, switched to p.o. apixaban Outpatient hypercoagulability work-up Monitor closely for any signs of bleeding  Syncope with brief LOC None since admission Likely 2/2 above Continue to monitor  Acute hypoxic respiratory failure likely 2/2 submassive PE Resolved, currently on room air  Acute on chronic right shoulder pain X-ray with diffuse OA but no acute dislocation or fracture Pain management  Hypertension Continue ARB  Diabetes mellitus type 2, uncontrolled with hyperglycemia Last A1c 7.4 on 10/10/2020 SSI, lantus, Accu-Cheks, hypoglycemic protocol  History of hepatitis C NASH Patient was treated in the 90s Continue outpatient follow-up  GERD Continue PPI      Estimated body mass index is 26.5 kg/m as calculated from  the following:   Height as of this encounter: 6' 1"  (1.854 m).   Weight as of this encounter: 91.1 kg.     Code Status: Full  Family Communication: None at bedside  Disposition Plan: Status is: Inpatient  Remains inpatient appropriate because:Inpatient level of care appropriate due to severity of illness   Dispo: The patient is from: Home              Anticipated d/c is to: Home              Patient currently is not medically stable to d/c.   Difficult to place patient No     Consultants:  PCCM  Procedures:  S/p TPA  Antimicrobials:  None  DVT prophylaxis: Eliquis   Objective: Vitals:   10/11/20 1132 10/11/20 1200 10/11/20 1300 10/11/20 1421  BP:  130/84 125/85 124/87  Pulse:  83 84 79  Resp:  17 18 16   Temp: 97.6 F (36.4 C)   (!) 97.5 F (36.4 C)  TempSrc: Oral   Oral  SpO2:  91% 93% 95%  Weight:      Height:        Intake/Output Summary (Last 24 hours) at 10/11/2020 1554 Last data filed at 10/11/2020 0926 Gross per 24 hour  Intake 644.73 ml  Output 1000 ml  Net -355.27 ml   Filed Weights   10/09/20 1541 10/10/20 0500 10/11/20 0148  Weight: 96.2 kg 91.1 kg 91.1 kg    Exam:  General: NAD   Cardiovascular: S1, S2 present  Respiratory: CTAB  Abdomen: Soft, nontender, nondistended, bowel sounds present  Musculoskeletal: No bilateral pedal edema  noted  Skin: Normal  Psychiatry: Normal mood    Data Reviewed: CBC: Recent Labs  Lab 10/09/20 1543 10/10/20 0219 10/10/20 0746 10/11/20 0053  WBC 11.5* 9.1 9.2 6.3  NEUTROABS 7.8*  --   --   --   HGB 15.8 14.8 14.9 14.4  HCT 46.8 41.5 42.6 40.9  MCV 91.6 87.7 89.1 88.9  PLT 125* 96* 94* 867*   Basic Metabolic Panel: Recent Labs  Lab 10/09/20 1543 10/10/20 0219 10/11/20 0053  NA 133* 136 134*  K 3.7 3.6 3.9  CL 99 107 106  CO2 20* 20* 19*  GLUCOSE 326* 214* 187*  BUN 22 18 22   CREATININE 1.47* 1.00 1.25*  CALCIUM 9.5 9.2 8.8*  MG  --  2.1  --   PHOS  --  3.8  --     GFR: Estimated Creatinine Clearance: 61.3 mL/min (A) (by C-G formula based on SCr of 1.25 mg/dL (H)). Liver Function Tests: Recent Labs  Lab 10/09/20 1543  AST 26  ALT 26  ALKPHOS 38  BILITOT 1.0  PROT 7.2  ALBUMIN 3.6   No results for input(s): LIPASE, AMYLASE in the last 168 hours. No results for input(s): AMMONIA in the last 168 hours. Coagulation Profile: Recent Labs  Lab 10/09/20 1524  INR 1.1   Cardiac Enzymes: No results for input(s): CKTOTAL, CKMB, CKMBINDEX, TROPONINI in the last 168 hours. BNP (last 3 results) No results for input(s): PROBNP in the last 8760 hours. HbA1C: Recent Labs    10/10/20 0219  HGBA1C 7.4*   CBG: Recent Labs  Lab 10/10/20 1056 10/10/20 1617 10/10/20 2119 10/11/20 0725 10/11/20 1129  GLUCAP 308* 239* 243* 186* 208*   Lipid Profile: Recent Labs    10/09/20 1544  CHOL 154  HDL 41  LDLCALC 75  TRIG 191*  CHOLHDL 3.8   Thyroid Function Tests: Recent Labs    10/09/20 1544  TSH 4.219   Anemia Panel: No results for input(s): VITAMINB12, FOLATE, FERRITIN, TIBC, IRON, RETICCTPCT in the last 72 hours. Urine analysis:    Component Value Date/Time   COLORURINE YELLOW 01/20/2013 2215   APPEARANCEUR CLEAR 01/20/2013 2215   LABSPEC >1.030 (H) 01/20/2013 2215   PHURINE 5.0 01/20/2013 2215   GLUCOSEU 100 (A) 01/20/2013 2215   HGBUR LARGE (A) 01/20/2013 2215   BILIRUBINUR NEGATIVE 01/20/2013 2215   KETONESUR NEGATIVE 01/20/2013 2215   PROTEINUR NEGATIVE 01/20/2013 2215   UROBILINOGEN 0.2 01/20/2013 2215   NITRITE NEGATIVE 01/20/2013 2215   LEUKOCYTESUR NEGATIVE 01/20/2013 2215   Sepsis Labs: @LABRCNTIP (procalcitonin:4,lacticidven:4)  ) Recent Results (from the past 240 hour(s))  Resp Panel by RT-PCR (Flu A&B, Covid) Nasopharyngeal Swab     Status: None   Collection Time: 10/09/20  4:53 PM   Specimen: Nasopharyngeal Swab; Nasopharyngeal(NP) swabs in vial transport medium  Result Value Ref Range Status   SARS  Coronavirus 2 by RT PCR NEGATIVE NEGATIVE Final    Comment: (NOTE) SARS-CoV-2 target nucleic acids are NOT DETECTED.  The SARS-CoV-2 RNA is generally detectable in upper respiratory specimens during the acute phase of infection. The lowest concentration of SARS-CoV-2 viral copies this assay can detect is 138 copies/mL. A negative result does not preclude SARS-Cov-2 infection and should not be used as the sole basis for treatment or other patient management decisions. A negative result may occur with  improper specimen collection/handling, submission of specimen other than nasopharyngeal swab, presence of viral mutation(s) within the areas targeted by this assay, and inadequate number of viral copies(<138 copies/mL).  A negative result must be combined with clinical observations, patient history, and epidemiological information. The expected result is Negative.  Fact Sheet for Patients:  EntrepreneurPulse.com.au  Fact Sheet for Healthcare Providers:  IncredibleEmployment.be  This test is no t yet approved or cleared by the [place] FDA and  has been authorized for detection and/or diagnosis of SARS-CoV-2 by FDA under an Emergency Use Authorization (EUA). This EUA will remain  in effect (meaning this test can be used) for the duration of the COVID-19 declaration under Section 564(b)(1) of the Act, 21 U.S.C.section 360bbb-3(b)(1), unless the authorization is terminated  or revoked sooner.       Influenza A by PCR NEGATIVE NEGATIVE Final   Influenza B by PCR NEGATIVE NEGATIVE Final    Comment: (NOTE) The Xpert Xpress SARS-CoV-2/FLU/RSV plus assay is intended as an aid in the diagnosis of influenza from Nasopharyngeal swab specimens and should not be used as a sole basis for treatment. Nasal washings and aspirates are unacceptable for Xpert Xpress SARS-CoV-2/FLU/RSV testing.  Fact Sheet for  Patients: EntrepreneurPulse.com.au  Fact Sheet for Healthcare Providers: IncredibleEmployment.be  This test is not yet approved or cleared by the [place] FDA and has been authorized for detection and/or diagnosis of SARS-CoV-2 by FDA under an Emergency Use Authorization (EUA). This EUA will remain in effect (meaning this test can be used) for the duration of the COVID-19 declaration under Section 564(b)(1) of the Act, 21 U.S.C. section 360bbb-3(b)(1), unless the authorization is terminated or revoked.  Performed at [facility], [address], [place], [place] [address]   MRSA PCR Screening     Status: None   Collection Time: 10/09/20 10:38 PM   Specimen: Nasopharyngeal  Result Value Ref Range Status   MRSA by PCR NEGATIVE NEGATIVE Final    Comment:        The GeneXpert MRSA Assay (FDA approved for NASAL specimens only), is one component of a comprehensive MRSA colonization surveillance program. It is not intended to diagnose MRSA infection nor to guide or monitor treatment for MRSA infections. Performed at [facility], [place] [address], [place], [place] [address]       Studies: ECHOCARDIOGRAM COMPLETE  Result Date: 10/10/2020    ECHOCARDIOGRAM REPORT   Patient Name:   Stephen Stephenson Date of Exam: 10/10/2020 Medical Rec #:  [id number]      Height:       73.0 in Accession #:    [id number]     Weight:       200.8 lb Date of Birth:  Mar 17, 1951      BSA:          2.155 m Patient Age:    29 years       BP:           180/77 mmHg Patient Gender: M              HR:           90 bpm. Exam Location:  Inpatient Procedure: 2D Echo, Cardiac Doppler and Color Doppler Indications:    Pulmonary embolus  History:        Patient has no prior history of Echocardiogram examinations.                 Risk Factors:Hypertension and Diabetes.  Sonographer:    Clayton Lefort RDCS (AE) Referring Phys: [place]  1. Left ventricular  ejection fraction, by estimation, is 60 to 65%. The left ventricle has  normal function. The left ventricle has no regional wall motion abnormalities. The left ventricular internal cavity size was mildly dilated. There is mild concentric left ventricular hypertrophy. Left ventricular diastolic parameters are consistent with Grade I diastolic dysfunction (impaired relaxation).  2. Right ventricular systolic function is normal. The right ventricular size is normal. There is normal pulmonary artery systolic pressure.  3. The mitral valve is normal in structure. No evidence of mitral valve regurgitation. No evidence of mitral stenosis.  4. The aortic valve is tricuspid. Aortic valve regurgitation is not visualized. No aortic stenosis is present.  5. Aortic dilatation noted. There is mild dilatation of the aortic root, measuring 38 mm. There is mild dilatation of the ascending aorta, measuring 39 mm.  6. The inferior vena cava is normal in size with greater than 50% respiratory variability, suggesting right atrial pressure of 3 mmHg. FINDINGS  Left Ventricle: Left ventricular ejection fraction, by estimation, is 60 to 65%. The left ventricle has normal function. The left ventricle has no regional wall motion abnormalities. The left ventricular internal cavity size was mildly dilated. There is  mild concentric left ventricular hypertrophy. Left ventricular diastolic parameters are consistent with Grade I diastolic dysfunction (impaired relaxation). Indeterminate filling pressures. Right Ventricle: The right ventricular size is normal. No increase in right ventricular wall thickness. Right ventricular systolic function is normal. There is normal pulmonary artery systolic pressure. The tricuspid regurgitant velocity is 1.79 m/s, and  with an assumed right atrial pressure of 3 mmHg, the estimated right ventricular systolic pressure is 10.9 mmHg. Left Atrium: Left atrial size was normal in size. Right Atrium: Right atrial size  was normal in size. Pericardium: There is no evidence of pericardial effusion. Mitral Valve: The mitral valve is normal in structure. No evidence of mitral valve regurgitation. No evidence of mitral valve stenosis. MV peak gradient, 2.2 mmHg. The mean mitral valve gradient is 1.0 mmHg. Tricuspid Valve: The tricuspid valve is normal in structure. Tricuspid valve regurgitation is not demonstrated. No evidence of tricuspid stenosis. Aortic Valve: The aortic valve is tricuspid. Aortic valve regurgitation is not visualized. No aortic stenosis is present. Aortic valve mean gradient measures 2.0 mmHg. Aortic valve peak gradient measures 4.3 mmHg. Aortic valve area, by VTI measures 3.54 cm. Pulmonic Valve: The pulmonic valve was normal in structure. Pulmonic valve regurgitation is trivial. No evidence of pulmonic stenosis. Aorta: Aortic dilatation noted. There is mild dilatation of the aortic root, measuring 38 mm. There is mild dilatation of the ascending aorta, measuring 39 mm. Venous: The inferior vena cava is normal in size with greater than 50% respiratory variability, suggesting right atrial pressure of 3 mmHg. IAS/Shunts: No atrial level shunt detected by color flow Doppler.  LEFT VENTRICLE PLAX 2D LVIDd:         3.80 cm  Diastology LVIDs:         2.50 cm  LV e' medial:    5.87 cm/s LV PW:         1.20 cm  LV E/e' medial:  10.2 LV IVS:        1.10 cm  LV e' lateral:   7.94 cm/s LVOT diam:     2.30 cm  LV E/e' lateral: 7.5 LV SV:         62 LV SV Index:   29 LVOT Area:     4.15 cm  RIGHT VENTRICLE RV Basal diam:  3.00 cm RV S prime:     11.50 cm/s TAPSE (M-mode): 1.8 cm LEFT  ATRIUM             Index       RIGHT ATRIUM           Index LA diam:        3.00 cm 1.39 cm/m  RA Area:     14.20 cm LA Vol (A2C):   24.8 ml 11.51 ml/m RA Volume:   30.60 ml  14.20 ml/m LA Vol (A4C):   25.9 ml 12.02 ml/m LA Biplane Vol: 26.0 ml 12.06 ml/m  AORTIC VALVE AV Area (Vmax):    4.07 cm AV Area (Vmean):   3.59 cm AV Area (VTI):      3.54 cm AV Vmax:           104.00 cm/s AV Vmean:          72.600 cm/s AV VTI:            0.176 m AV Peak Grad:      4.3 mmHg AV Mean Grad:      2.0 mmHg LVOT Vmax:         102.00 cm/s LVOT Vmean:        62.800 cm/s LVOT VTI:          0.150 m LVOT/AV VTI ratio: 0.85  AORTA Ao Root diam: 3.80 cm Ao Asc diam:  3.90 cm MITRAL VALVE               TRICUSPID VALVE MV Area (PHT): 2.74 cm    TR Peak grad:   12.8 mmHg MV Area VTI:   3.13 cm    TR Vmax:        179.00 cm/s MV Peak grad:  2.2 mmHg MV Mean grad:  1.0 mmHg    SHUNTS MV Vmax:       0.75 m/s    Systemic VTI:  0.15 m MV Vmean:      50.7 cm/s   Systemic Diam: 2.30 cm MV Decel Time: 277 msec MV E velocity: 59.90 cm/s MV A velocity: 74.10 cm/s MV E/A ratio:  0.81 Skeet Latch MD Electronically signed by Skeet Latch MD Signature Date/Time: 10/10/2020/6:35:31 PM    Final    VAS [place] LOWER EXTREMITY VENOUS (DVT)  Result Date: 10/10/2020  Lower Venous DVT Study Indications: Pulmonary embolism.  Limitations: Body habitus. Performing Technologist: Antonieta Pert RDMS, RVT  Examination Guidelines: A complete evaluation includes B-mode imaging, spectral Doppler, color Doppler, and power Doppler as needed of all accessible portions of each vessel. Bilateral testing is considered an integral part of a complete examination. Limited examinations for reoccurring indications may be performed as noted. The reflux portion of the exam is performed with the patient in reverse Trendelenburg.  +---------+---------------+---------+-----------+----------+--------------+ RIGHT    CompressibilityPhasicitySpontaneityPropertiesThrombus Aging +---------+---------------+---------+-----------+----------+--------------+ CFV      Full           Yes      Yes                                 +---------+---------------+---------+-----------+----------+--------------+ SFJ      Full                                                         +---------+---------------+---------+-----------+----------+--------------+ FV Prox  Full                                                        +---------+---------------+---------+-----------+----------+--------------+  FV Mid   Full                                                        +---------+---------------+---------+-----------+----------+--------------+ FV DistalFull                                                        +---------+---------------+---------+-----------+----------+--------------+ PFV      Full                                                        +---------+---------------+---------+-----------+----------+--------------+ POP      Full           Yes      Yes                                 +---------+---------------+---------+-----------+----------+--------------+ PTV      Full                                                        +---------+---------------+---------+-----------+----------+--------------+ PERO     Full                                                        +---------+---------------+---------+-----------+----------+--------------+ GSV      Full                                                        +---------+---------------+---------+-----------+----------+--------------+   +---------+---------------+---------+-----------+---------------+--------------+ LEFT     CompressibilityPhasicitySpontaneityProperties     Thrombus Aging +---------+---------------+---------+-----------+---------------+--------------+ CFV      Full           Yes      Yes                                      +---------+---------------+---------+-----------+---------------+--------------+ SFJ      Full                                                             +---------+---------------+---------+-----------+---------------+--------------+ FV Prox  Full                                                              +---------+---------------+---------+-----------+---------------+--------------+  FV Mid   Full                                                             +---------+---------------+---------+-----------+---------------+--------------+ FV DistalFull                                                             +---------+---------------+---------+-----------+---------------+--------------+ PFV      Full                                                             +---------+---------------+---------+-----------+---------------+--------------+ POP      Partial        Yes      Yes        softly         Acute                                                      echogenic                     +---------+---------------+---------+-----------+---------------+--------------+ PTV      Partial                            softly         Acute                                                      echogenic                     +---------+---------------+---------+-----------+---------------+--------------+ PERO     Partial                                                          +---------+---------------+---------+-----------+---------------+--------------+ GSV      Full                                                             +---------+---------------+---------+-----------+---------------+--------------+     Summary: RIGHT: - There is no evidence of deep vein thrombosis in the lower extremity.  - No cystic structure found in the popliteal fossa.  LEFT: - Findings consistent with acute deep vein thrombosis  involving the left popliteal vein, and left posterior tibial veins. - No cystic structure found in the popliteal fossa.  *See table(s) above for measurements and observations. Electronically signed by Curt Jews MD on 10/10/2020 at 9:43:09 PM.    Final     Scheduled Meds: . acetaminophen  650 mg Oral Q6H  . apixaban  10 mg Oral BID   Followed by  . [START ON  10/18/2020] apixaban  5 mg Oral BID  . Chlorhexidine Gluconate Cloth  6 each Topical Daily  . doxycycline  100 mg Oral Daily  . fluticasone  2 spray Each Nare Daily  . insulin aspart  0-15 Units Subcutaneous TID WC  . insulin aspart  0-5 Units Subcutaneous QHS  . insulin glargine  12 Units Subcutaneous Daily  . irbesartan  300 mg Oral Daily  . melatonin  3 mg Oral QHS  . pantoprazole  40 mg Oral Daily    Continuous Infusions:   LOS: 2 days     Alma Friendly, MD [facility]  If 7PM-7AM, please contact night-coverage [email or web address] 10/11/2020, 3:54 PM

## 2020-10-11 NOTE — Progress Notes (Signed)
ANTICOAGULATION CONSULT NOTE - Follow Up Consult  Pharmacy Consult for heparin Indication: pulmonary embolus  No Known Allergies  Patient Measurements: Height: 6' 1"  (185.4 cm) Weight: 91.1 kg (200 lb 13.4 oz) IBW/kg (Calculated) : 79.9  Vital Signs: Temp: 98.5 F (36.9 C) (03/29 1906) Temp Source: Axillary (03/29 1906) BP: 118/84 (03/30 0100) Pulse Rate: 82 (03/30 0100)  Labs: Recent Labs    10/09/20 1524 10/09/20 1543 10/09/20 1543 10/09/20 1750 10/09/20 2030 10/10/20 0219 10/10/20 0627 10/10/20 0746 10/10/20 1502 10/11/20 0053  HGB  --  15.8   < >  --   --  14.8  --  14.9  --  14.4  HCT  --  46.8   < >  --   --  41.5  --  42.6  --  40.9  PLT  --  125*   < >  --   --  96*  --  94*  --  103*  APTT 29  --   --   --  62*  --   --   --   --   --   LABPROT 14.1  --   --   --   --   --   --   --   --   --   INR 1.1  --   --   --   --   --   --   --   --   --   HEPARINUNFRC  --   --   --   --   --   --  0.15*  --  0.16* 0.17*  CREATININE  --  1.47*  --   --   --  1.00  --   --   --   --   TROPONINIHS  --  32*  --  249*  --   --   --   --   --   --    < > = values in this interval not displayed.    Estimated Creatinine Clearance: 76.6 mL/min (by C-G formula based on SCr of 1 mg/dL).   Assessment: 70 yo M with acute BL occlusive PE with severe clot burden and R heart strain s/p tPA at ~1731 on 3/28. Pharmacy consulted for heparin when aPTT < 80. Patient not on oral anticoagulants PTA.  Heparin level 0.17 (remains subtherapeutic) only up very slightly with increased heparin drip rate to 1650 units/hr. No issues IV infusion or access. No bleeding noted. Plt low but stable.  Goal of Therapy:  Heparin 0.3 -0.7 units/ml Monitor platelets by anticoagulation protocol: Yes   Plan:  Increase heparin to 1950 units/hr  Check 6 hr heparin level  Sherlon Handing, PharmD, BCPS Please see amion for complete clinical pharmacist phone list 10/11/2020 1:38 AM

## 2020-10-12 ENCOUNTER — Telehealth: Payer: Self-pay | Admitting: *Deleted

## 2020-10-12 LAB — CBC
HCT: 41.6 % (ref 39.0–52.0)
Hemoglobin: 14.8 g/dL (ref 13.0–17.0)
MCH: 31.4 pg (ref 26.0–34.0)
MCHC: 35.6 g/dL (ref 30.0–36.0)
MCV: 88.3 fL (ref 80.0–100.0)
Platelets: 116 10*3/uL — ABNORMAL LOW (ref 150–400)
RBC: 4.71 MIL/uL (ref 4.22–5.81)
RDW: 12.7 % (ref 11.5–15.5)
WBC: 6.7 10*3/uL (ref 4.0–10.5)
nRBC: 0 % (ref 0.0–0.2)

## 2020-10-12 LAB — BASIC METABOLIC PANEL
Anion gap: 9 (ref 5–15)
BUN: 17 mg/dL (ref 8–23)
CO2: 19 mmol/L — ABNORMAL LOW (ref 22–32)
Calcium: 8.9 mg/dL (ref 8.9–10.3)
Chloride: 108 mmol/L (ref 98–111)
Creatinine, Ser: 1.19 mg/dL (ref 0.61–1.24)
GFR, Estimated: >60 mL/min (ref >60)
Glucose, Bld: 172 mg/dL — ABNORMAL HIGH (ref 70–99)
Potassium: 3.8 mmol/L (ref 3.5–5.1)
Sodium: 136 mmol/L (ref 135–145)

## 2020-10-12 LAB — GLUCOSE, CAPILLARY
Glucose-Capillary: 270 mg/dL — ABNORMAL HIGH (ref 70–99)
Glucose-Capillary: 151 mg/dL — ABNORMAL HIGH (ref 70–99)

## 2020-10-12 MED ORDER — HYDROCODONE-ACETAMINOPHEN 5-325 MG PO TABS: 1.0000 | ORAL_TABLET | Freq: Four times a day (QID) | ORAL | 0 refills | Status: AC | PRN

## 2020-10-12 MED ORDER — APIXABAN (ELIQUIS) VTE STARTER PACK (10MG AND 5MG): ORAL_TABLET | ORAL | 0 refills | Status: DC

## 2020-10-12 NOTE — Telephone Encounter (Signed)
Per Eric's OV note 10/05/20 patient needs US done. Encounter form not brought to [place] for scheduling. Patient currently admitted and will have to schedule once he is discharged.

## 2020-10-12 NOTE — Plan of Care (Signed)
Problem: Education: Goal: Ability to describe self-care measures that may prevent or decrease complications (Diabetes Survival Skills Education) will improve Outcome: Adequate for Discharge Goal: Individualized Educational Video(s) Outcome: Adequate for Discharge   Problem: Metabolic: Goal: Ability to maintain appropriate glucose levels will improve Outcome: Adequate for Discharge   Problem: Nutritional: Goal: Maintenance of adequate nutrition will improve Outcome: Adequate for Discharge Goal: Progress toward achieving an optimal weight will improve Outcome: Adequate for Discharge   Problem: Education: Goal: Knowledge of General Education information will improve Description: Including pain rating scale, medication(s)/side effects and non-pharmacologic comfort measures Outcome: Adequate for Discharge   Problem: Health Behavior/Discharge Planning: Goal: Ability to manage health-related needs will improve Outcome: Adequate for Discharge   Problem: Clinical Measurements: Goal: Ability to maintain clinical measurements within normal limits will improve Outcome: Adequate for Discharge Goal: Will remain free from infection Outcome: Adequate for Discharge Goal: Diagnostic test results will improve Outcome: Adequate for Discharge Goal: Respiratory complications will improve Outcome: Adequate for Discharge Goal: Cardiovascular complication will be avoided Outcome: Adequate for Discharge   Problem: Activity: Goal: Risk for activity intolerance will decrease Outcome: Adequate for Discharge   Problem: Nutrition: Goal: Adequate nutrition will be maintained Outcome: Adequate for Discharge   Problem: Coping: Goal: Level of anxiety will decrease Outcome: Adequate for Discharge   Problem: Elimination: Goal: Will not experience complications related to bowel motility Outcome: Adequate for Discharge Goal: Will not experience complications related to urinary retention Outcome:  Adequate for Discharge   Problem: Pain Managment: Goal: General experience of comfort will improve Outcome: Adequate for Discharge   Problem: Safety: Goal: Ability to remain free from injury will improve Outcome: Adequate for Discharge   Problem: Skin Integrity: Goal: Risk for impaired skin integrity will decrease Outcome: Adequate for Discharge   Problem: Consults Goal: Venous Thromboembolism Patient Education Description: See Patient Education Module for education specifics. Outcome: Adequate for Discharge Goal: Diagnosis - Venous Thromboembolism (VTE) Description: Choose a selection Outcome: Adequate for Discharge Goal: Pharmacy Consult for anticoagulation Outcome: Adequate for Discharge Goal: Skin Care Protocol Initiated - if Braden Score 18 or less Description: If consults are not indicated, leave blank or document N/A Outcome: Adequate for Discharge Goal: Nutrition Consult-if indicated Outcome: Adequate for Discharge Goal: Diabetes Guidelines if Diabetic/Glucose > 140 Description: If diabetic or lab glucose is > 140 mg/dl - Initiate Diabetes/Hyperglycemia Guidelines & Document Interventions  Outcome: Adequate for Discharge   Problem: Phase I Progression Outcomes Goal: Pain controlled with appropriate interventions Outcome: Adequate for Discharge Goal: Dyspnea controlled at rest (PE) Outcome: Adequate for Discharge Goal: Tolerating diet Outcome: Adequate for Discharge Goal: Initial discharge plan identified Outcome: Adequate for Discharge Goal: Voiding-avoid urinary catheter unless indicated Outcome: Adequate for Discharge Goal: Hemodynamically stable Outcome: Adequate for Discharge Goal: Other Phase I Outcomes/Goals Outcome: Adequate for Discharge   Problem: Phase II Progression Outcomes Goal: Therapeutic drug levels for anticoagulation Outcome: Adequate for Discharge Goal: 02 sats trending upward/stable (PE) Outcome: Adequate for Discharge Goal: Discharge  plan established Outcome: Adequate for Discharge Goal: Tolerating diet Outcome: Adequate for Discharge Goal: Other Phase II Outcomes/Goals Outcome: Adequate for Discharge   Problem: Phase III Progression Outcomes Goal: 02 sats stabilized Outcome: Adequate for Discharge Goal: Activity at appropriate level-compared to baseline Description: (UP IN CHAIR FOR HEMODIALYSIS) Outcome: Adequate for Discharge Goal: Discharge plan remains appropriate-arrangements made Outcome: Adequate for Discharge Goal: Other Phase III Outcomes/Goals Outcome: Adequate for Discharge   Problem: Discharge Progression Outcomes Goal: Barriers To Progression Addressed/Resolved Outcome: Adequate for Discharge  Goal: Discharge plan in place and appropriate Outcome: Adequate for Discharge Goal: Pain controlled with appropriate interventions Outcome: Adequate for Discharge Goal: Hemodynamically stable Outcome: Adequate for Discharge Goal: Complications resolved/controlled Outcome: Adequate for Discharge Goal: Tolerating diet Outcome: Adequate for Discharge Goal: Activity appropriate for discharge plan Outcome: Adequate for Discharge Goal: Other Discharge Outcomes/Goals Outcome: Adequate for Discharge

## 2020-10-12 NOTE — Care Management Important Message (Signed)
Important Message  Patient Details  Name: Stephen Stephenson MRN: [id number] Date of Birth: [birth year]   Medicare Important Message Given:  Yes  Patient left prior to IM delivery.  Will mail to the patient home address.     [name] 10/12/2020, 12:54 PM

## 2020-10-12 NOTE — Discharge Summary (Signed)
Discharge Summary  Stephen Stephenson:[id number] DOB: Mar 21, 1951  PCP: Sharilyn Sites, MD  Admit date: 10/09/2020 Discharge date: 10/12/2020  Time spent: 40 mins  Recommendations for Outpatient Follow-up:  1. Follow-up with PCP in 1 week with repeat labs 2. Follow-up with pulmonology as scheduled  Discharge Diagnoses:  Active Hospital Problems   Diagnosis Date Noted  . Right shoulder pain 10/10/2020  . Pulmonary emboli ([place]) 10/09/2020  . Acute pulmonary embolism ([place]) 10/09/2020    Resolved Hospital Problems  No resolved problems to display.    Discharge Condition: Stable  Diet recommendation: Heart healthy  Vitals:   10/11/20 1900 10/12/20 1038  BP: 126/81 133/82  Pulse:    Resp:  19  Temp: 97.8 F (36.6 C) 98.5 F (36.9 C)  SpO2: 98% 98%    History of present illness:  Mr. Szymborski presented to the [facility] on 3/28 after two syncopal episodes, one of which caused him to hit his head. He also complained of R-sided anterior chest pain. ED workup revealed large PE with R heart strain. CT Head was negative for intracranial bleed and he was treated with 1/2 dose TPA and started on heparin gtt. He was transferred to the [facility] ICU due to hemodynamic instability.   Today, patient denies any new complaints, reports feeling much better, denies any worsening shortness of breath, denies any chest pain, domino pain, nausea/vomiting, fever/chills, any bleeding (hematuria, melena or hematochezia).  Discussed extensively about medication adherence as well as adherence to follow-up appointments.  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   Hospital Course:  Active Problems:   Pulmonary emboli (HCC)   Acute pulmonary embolism (HCC)   Right shoulder pain   Acute submassive PE with hemodynamic compromise/right heart strain on admission LLE DVT S/p TPA on 3/28, currently HD stable Echo showed EF of 60 to 68%, grade 1 diastolic dysfunction LE Doppler showed  left popliteal, posterior tibial DVT S/p IV Heparin, switched to p.o. apixaban Outpatient hypercoagulability work-up Follow-up with PCP in 1 week as well as pulmonology in 1 month  Syncope with brief LOC None since admission Likely 2/2 above  Acute hypoxic respiratory failure likely 2/2 submassive PE Resolved, currently on room air  Acute on chronic right shoulder pain X-ray with diffuse OA but no acute dislocation or fracture Pain management  Hypertension Continue ARB  Diabetes mellitus type 2, uncontrolled with hyperglycemia Last A1c 7.4 on 10/10/2020 Continue home regimen  History of hepatitis C NASH Patient was treated in the 90s Continue outpatient follow-up  GERD Continue PPI     Estimated body mass index is 26.58 kg/m as calculated from the following:   Height as of this encounter: 6' 1"  (1.854 m).   Weight as of this encounter: 91.4 kg.    Procedures:  S/p TPA  Consultations:  PCCM  IR  Discharge Exam: BP 133/82   Pulse 79   Temp 98.5 F (36.9 C) (Oral)   Resp 19   Ht 6' 1"  (1.854 m)   Wt 91.4 kg   SpO2 98%   BMI 26.58 kg/m    General: NAD Cardiovascular: S1, S2 present Respiratory: CTA B     Discharge Instructions You were cared for by a hospitalist during your hospital stay. If you have any questions about your discharge medications or the care you received while you were in the hospital after you are discharged, you can call the unit and asked to speak with the hospitalist on call  if the hospitalist that took care of you is not available. Once you are discharged, your primary care physician will handle any further medical issues. Please note that NO REFILLS for any discharge medications will be authorized once you are discharged, as it is imperative that you return to your primary care physician (or establish a relationship with a primary care physician if you do not have one) for your aftercare needs so that they can reassess  your need for medications and monitor your lab values.  Discharge Instructions    Diet - low sodium heart healthy   Complete by: As directed    Increase activity slowly   Complete by: As directed      Allergies as of 10/12/2020   No Known Allergies     Medication List    STOP taking these medications   omeprazole 40 MG capsule Commonly known as: PRILOSEC     TAKE these medications   Apixaban Starter Pack (23m and 534m Commonly known as: ELIQUIS STARTER PACK Take as directed on package: start with two-45m78mablets twice daily for 7 days. On day 8, switch to one-45mg73mblet twice daily.   doxycycline 100 MG tablet Commonly known as: VIBRA-TABS Take 100 mg by mouth daily. Starting 3.16.22 x 30 days for eyes.   erythromycin ophthalmic ointment Place 1 application into both eyes at bedtime. Staring 3.16.22 for eyes.   glimepiride 4 MG tablet Commonly known as: AMARYL Take 4 mg by mouth daily with breakfast.   HYDROcodone-acetaminophen 5-325 MG tablet Commonly known as: NORCO/VICODIN Take 1 tablet by mouth every 6 (six) hours as needed for up to 3 days for moderate pain.   Janumet 50-1000 MG tablet Generic drug: sitaGLIPtin-metformin Take 1 tablet by mouth 2 (two) times daily.   olmesartan 40 MG tablet Commonly known as: BENICAR Take 40 mg by mouth daily.   pantoprazole 40 MG tablet Commonly known as: PROTONIX Take 1 tablet (40 mg total) by mouth daily.   potassium citrate 10 MEQ (1080 MG) SR tablet Commonly known as: UROCIT-K Take 10 mEq by mouth 2 (two) times daily.   triamcinolone 0.1 % Commonly known as: KENALOG Apply 1 application topically 2 (two) times daily. APPLY CREAM EXTERNALLY TWICE DAILY FOR UP TO 2 CONTINUOUS WEEKS THEN TAKE A 2 WEEK BREAK BEFORE RESTARTING.      No Known Allergies  Follow-up Information    [facility]. Schedule an appointment as soon as possible for a visit in 1 week(s).   Specialty: Family Medicine Contact  information: [phone]    [facility] Follow [facility].   Specialty: Pulmonology Why: Appointment set for 11/13/20 at 10:30 am Contact information: [phone] [phone]           The results of significant diagnostics from this hospitalization (including imaging, microbiology, ancillary and laboratory) are listed below for reference.    Significant Diagnostic Studies: DG Shoulder Right  Result Date: 10/10/2020 CLINICAL DATA:  Pain following fall EXAM: RIGHT SHOULDER - 2+ VIEW COMPARISON:  None. FINDINGS: Frontal, oblique, and Y scapular images were obtained. No fracture or dislocation. There is generalized joint space narrowing. There is bony overgrowth along the inferior right humeral head and inferior glenoid. There is also bony overgrowth along the inferior aspect of the acromioclavicular joint. No erosive change. Visualized right lung clear. IMPRESSION: Diffuse osteoarthritic change. Bony overgrowth along the inferior aspect of  the acromioclavicular joint potentially may place patient at increased risk for impingement syndrome. No fracture or dislocation. No erosion. Electronically Signed   By: Lowella Grip III M.D.   On: 10/10/2020 08:25   CT Head Wo Contrast  Result Date: 10/09/2020 CLINICAL DATA:  Two syncopal episodes with loss of consciousness today. Unsure of head injury. Hypoxemia. EXAM: CT HEAD WITHOUT CONTRAST CT CERVICAL SPINE WITHOUT CONTRAST TECHNIQUE: Multidetector CT imaging of the head and cervical spine was performed following the standard protocol without intravenous contrast. Multiplanar CT image reconstructions of the cervical spine were also generated. COMPARISON:  CT head 10/02/2011. FINDINGS: CT HEAD FINDINGS Brain: There is no evidence of acute intracranial hemorrhage, mass lesion, brain edema or extra-axial fluid collection. Mild atrophy with mild  prominence of the ventricles and subarachnoid spaces. There is no CT evidence of acute cortical infarction. Vascular: Intracranial vascular calcifications. No hyperdense vessel identified. Skull: Negative for fracture or focal lesion. Sinuses/Orbits: The visualized paranasal sinuses and mastoid air cells are clear. No orbital abnormalities are seen. Other: None. CT CERVICAL SPINE FINDINGS Alignment: Normal. Skull base and vertebrae: No evidence of acute fracture or traumatic subluxation. Soft tissues and spinal canal: No prevertebral fluid or swelling. No visible canal hematoma. Disc levels: Relatively mild multilevel spondylosis with disc bulging and uncinate spurring. There is asymmetric facet hypertrophy on the left at C4-5, contributing to mild left foraminal narrowing. Upper chest: Unremarkable. Other: Mild left carotid atherosclerosis. IMPRESSION: 1. No acute intracranial or calvarial findings. 2. No evidence of acute cervical spine fracture, traumatic subluxation or static signs of instability. 3. Mild cervical spondylosis. Electronically Signed   By: Richardean Sale M.D.   On: 10/09/2020 16:42   CT Angio Chest PE W and/or Wo Contrast  Result Date: 10/09/2020 CLINICAL DATA:  High probability of pulmonary embolism. Short of breath EXAM: CT ANGIOGRAPHY CHEST WITH CONTRAST TECHNIQUE: Multidetector CT imaging of the chest was performed using the standard protocol during bolus administration of intravenous contrast. Multiplanar CT image reconstructions and MIPs were obtained to evaluate the vascular anatomy. CONTRAST:  169m OMNIPAQUE IOHEXOL 350 MG/ML SOLN COMPARISON:  None. FINDINGS: Cardiovascular: Large filling defect within the distal RIGHT main pulmonary artery which extends into the lower lobe pulmonary arteries and upper lobe pulmonary arteries. The tubular filling defects are occlusive and consistent with acute pulmonary emboli. Filling defects within the proximal segmental branches of the LEFT lower  lobe pulmonary arteries as well as the LEFT upper lobe pulmonary arteries. These filling defects are partially occlusive on the LEFT. The RIGHT ventricular ratio to LEFT ventricular ratio is equal to 2.78. Mediastinum/Nodes: No axillary or supraclavicular adenopathy. No mediastinal or hilar adenopathy. No pericardial fluid. Esophagus normal. Lungs/Pleura: Mild atelectasis at the lung bases. No clear evidence of pulmonary infarction. No pneumonia. Pneumothorax. Upper Abdomen: Limited view of the liver, kidneys, pancreas are unremarkable. Normal adrenal glands. Musculoskeletal: No acute osseous abnormality. Review of the MIP images confirms the above findings. IMPRESSION: 1. Bilateral acute occlusive pulmonary emboli. Overall clot burden is severe with evidence of RIGHT heart strain (RV/LV Ratio = 2.78) consistent with at least submassive (intermediate risk) PE. The presence of right heart strain has been associated with an increased risk of morbidity and mortality. 2. No evidence of pulmonary infarction. Critical Value/emergent results were called by telephone at the time of interpretation on 10/09/2020 at 4:42 pm to [facility], who verbally acknowledged these results. Electronically Signed   By: SSuzy BouchardM.D.   On: 10/09/2020 16:44  CT Cervical Spine Wo Contrast  Result Date: 10/09/2020 CLINICAL DATA:  Two syncopal episodes with loss of consciousness today. Unsure of head injury. Hypoxemia. EXAM: CT HEAD WITHOUT CONTRAST CT CERVICAL SPINE WITHOUT CONTRAST TECHNIQUE: Multidetector CT imaging of the head and cervical spine was performed following the standard protocol without intravenous contrast. Multiplanar CT image reconstructions of the cervical spine were also generated. COMPARISON:  CT head 10/02/2011. FINDINGS: CT HEAD FINDINGS Brain: There is no evidence of acute intracranial hemorrhage, mass lesion, brain edema or extra-axial fluid collection. Mild atrophy with mild prominence of the  ventricles and subarachnoid spaces. There is no CT evidence of acute cortical infarction. Vascular: Intracranial vascular calcifications. No hyperdense vessel identified. Skull: Negative for fracture or focal lesion. Sinuses/Orbits: The visualized paranasal sinuses and mastoid air cells are clear. No orbital abnormalities are seen. Other: None. CT CERVICAL SPINE FINDINGS Alignment: Normal. Skull base and vertebrae: No evidence of acute fracture or traumatic subluxation. Soft tissues and spinal canal: No prevertebral fluid or swelling. No visible canal hematoma. Disc levels: Relatively mild multilevel spondylosis with disc bulging and uncinate spurring. There is asymmetric facet hypertrophy on the left at C4-5, contributing to mild left foraminal narrowing. Upper chest: Unremarkable. Other: Mild left carotid atherosclerosis. IMPRESSION: 1. No acute intracranial or calvarial findings. 2. No evidence of acute cervical spine fracture, traumatic subluxation or static signs of instability. 3. Mild cervical spondylosis. Electronically Signed   By: Richardean Sale M.D.   On: 10/09/2020 16:42   ECHOCARDIOGRAM COMPLETE  Result Date: 10/10/2020    ECHOCARDIOGRAM REPORT   Patient Name:   LINZIE BOURSIQUOT Date of Exam: 10/10/2020 Medical Rec #:  [id number]      Height:       73.0 in Accession #:    [id number]     Weight:       200.8 lb Date of Birth:  [birth year]      BSA:          2.155 m Patient Age:    70 years       BP:           180/77 mmHg Patient Gender: M              HR:           90 bpm. Exam Location:  Inpatient Procedure: 2D Echo, Cardiac Doppler and Color Doppler Indications:    Pulmonary embolus  History:        Patient has no prior history of Echocardiogram examinations.                 Risk Factors:Hypertension and Diabetes.  Sonographer:    Clayton Lefort RDCS (AE) Referring Phys: [place]  1. Left ventricular ejection fraction, by estimation, is 60 to 65%. The left ventricle has normal  function. The left ventricle has no regional wall motion abnormalities. The left ventricular internal cavity size was mildly dilated. There is mild concentric left ventricular hypertrophy. Left ventricular diastolic parameters are consistent with Grade I diastolic dysfunction (impaired relaxation).  2. Right ventricular systolic function is normal. The right ventricular size is normal. There is normal pulmonary artery systolic pressure.  3. The mitral valve is normal in structure. No evidence of mitral valve regurgitation. No evidence of mitral stenosis.  4. The aortic valve is tricuspid. Aortic valve regurgitation is not visualized. No aortic stenosis is present.  5. Aortic dilatation noted. There is mild dilatation of the aortic root, measuring 38 mm. There is  mild dilatation of the ascending aorta, measuring 39 mm.  6. The inferior vena cava is normal in size with greater than 50% respiratory variability, suggesting right atrial pressure of 3 mmHg. FINDINGS  Left Ventricle: Left ventricular ejection fraction, by estimation, is 60 to 65%. The left ventricle has normal function. The left ventricle has no regional wall motion abnormalities. The left ventricular internal cavity size was mildly dilated. There is  mild concentric left ventricular hypertrophy. Left ventricular diastolic parameters are consistent with Grade I diastolic dysfunction (impaired relaxation). Indeterminate filling pressures. Right Ventricle: The right ventricular size is normal. No increase in right ventricular wall thickness. Right ventricular systolic function is normal. There is normal pulmonary artery systolic pressure. The tricuspid regurgitant velocity is 1.79 m/s, and  with an assumed right atrial pressure of 3 mmHg, the estimated right ventricular systolic pressure is 38.1 mmHg. Left Atrium: Left atrial size was normal in size. Right Atrium: Right atrial size was normal in size. Pericardium: There is no evidence of pericardial  effusion. Mitral Valve: The mitral valve is normal in structure. No evidence of mitral valve regurgitation. No evidence of mitral valve stenosis. MV peak gradient, 2.2 mmHg. The mean mitral valve gradient is 1.0 mmHg. Tricuspid Valve: The tricuspid valve is normal in structure. Tricuspid valve regurgitation is not demonstrated. No evidence of tricuspid stenosis. Aortic Valve: The aortic valve is tricuspid. Aortic valve regurgitation is not visualized. No aortic stenosis is present. Aortic valve mean gradient measures 2.0 mmHg. Aortic valve peak gradient measures 4.3 mmHg. Aortic valve area, by VTI measures 3.54 cm. Pulmonic Valve: The pulmonic valve was normal in structure. Pulmonic valve regurgitation is trivial. No evidence of pulmonic stenosis. Aorta: Aortic dilatation noted. There is mild dilatation of the aortic root, measuring 38 mm. There is mild dilatation of the ascending aorta, measuring 39 mm. Venous: The inferior vena cava is normal in size with greater than 50% respiratory variability, suggesting right atrial pressure of 3 mmHg. IAS/Shunts: No atrial level shunt detected by color flow Doppler.  LEFT VENTRICLE PLAX 2D LVIDd:         3.80 cm  Diastology LVIDs:         2.50 cm  LV e' medial:    5.87 cm/s LV PW:         1.20 cm  LV E/e' medial:  10.2 LV IVS:        1.10 cm  LV e' lateral:   7.94 cm/s LVOT diam:     2.30 cm  LV E/e' lateral: 7.5 LV SV:         62 LV SV Index:   29 LVOT Area:     4.15 cm  RIGHT VENTRICLE RV Basal diam:  3.00 cm RV S prime:     11.50 cm/s TAPSE (M-mode): 1.8 cm LEFT ATRIUM             Index       RIGHT ATRIUM           Index LA diam:        3.00 cm 1.39 cm/m  RA Area:     14.20 cm LA Vol (A2C):   24.8 ml 11.51 ml/m RA Volume:   30.60 ml  14.20 ml/m LA Vol (A4C):   25.9 ml 12.02 ml/m LA Biplane Vol: 26.0 ml 12.06 ml/m  AORTIC VALVE AV Area (Vmax):    4.07 cm AV Area (Vmean):   3.59 cm AV Area (VTI):     3.54 cm AV  Vmax:           104.00 cm/s AV Vmean:          72.600  cm/s AV VTI:            0.176 m AV Peak Grad:      4.3 mmHg AV Mean Grad:      2.0 mmHg LVOT Vmax:         102.00 cm/s LVOT Vmean:        62.800 cm/s LVOT VTI:          0.150 m LVOT/AV VTI ratio: 0.85  AORTA Ao Root diam: 3.80 cm Ao Asc diam:  3.90 cm MITRAL VALVE               TRICUSPID VALVE MV Area (PHT): 2.74 cm    TR Peak grad:   12.8 mmHg MV Area VTI:   3.13 cm    TR Vmax:        179.00 cm/s MV Peak grad:  2.2 mmHg MV Mean grad:  1.0 mmHg    SHUNTS MV Vmax:       0.75 m/s    Systemic VTI:  0.15 m MV Vmean:      50.7 cm/s   Systemic Diam: 2.30 cm MV Decel Time: 277 msec MV E velocity: 59.90 cm/s MV A velocity: 74.10 cm/s MV E/A ratio:  0.81 Skeet Latch MD Electronically signed by Skeet Latch MD Signature Date/Time: 10/10/2020/6:35:31 PM    Final    VAS [place] LOWER EXTREMITY VENOUS (DVT)  Result Date: 10/10/2020  Lower Venous DVT Study Indications: Pulmonary embolism.  Limitations: Body habitus. Performing Technologist: Antonieta Pert RDMS, RVT  Examination Guidelines: A complete evaluation includes B-mode imaging, spectral Doppler, color Doppler, and power Doppler as needed of all accessible portions of each vessel. Bilateral testing is considered an integral part of a complete examination. Limited examinations for reoccurring indications may be performed as noted. The reflux portion of the exam is performed with the patient in reverse Trendelenburg.  +---------+---------------+---------+-----------+----------+--------------+ RIGHT    CompressibilityPhasicitySpontaneityPropertiesThrombus Aging +---------+---------------+---------+-----------+----------+--------------+ CFV      Full           Yes      Yes                                 +---------+---------------+---------+-----------+----------+--------------+ SFJ      Full                                                        +---------+---------------+---------+-----------+----------+--------------+ FV Prox  Full                                                         +---------+---------------+---------+-----------+----------+--------------+ FV Mid   Full                                                        +---------+---------------+---------+-----------+----------+--------------+ FV DistalFull                                                        +---------+---------------+---------+-----------+----------+--------------+  PFV      Full                                                        +---------+---------------+---------+-----------+----------+--------------+ POP      Full           Yes      Yes                                 +---------+---------------+---------+-----------+----------+--------------+ PTV      Full                                                        +---------+---------------+---------+-----------+----------+--------------+ PERO     Full                                                        +---------+---------------+---------+-----------+----------+--------------+ GSV      Full                                                        +---------+---------------+---------+-----------+----------+--------------+   +---------+---------------+---------+-----------+---------------+--------------+ LEFT     CompressibilityPhasicitySpontaneityProperties     Thrombus Aging +---------+---------------+---------+-----------+---------------+--------------+ CFV      Full           Yes      Yes                                      +---------+---------------+---------+-----------+---------------+--------------+ SFJ      Full                                                             +---------+---------------+---------+-----------+---------------+--------------+ FV Prox  Full                                                             +---------+---------------+---------+-----------+---------------+--------------+ FV Mid   Full                                                              +---------+---------------+---------+-----------+---------------+--------------+ FV DistalFull                                                             +---------+---------------+---------+-----------+---------------+--------------+  PFV      Full                                                             +---------+---------------+---------+-----------+---------------+--------------+ POP      Partial        Yes      Yes        softly         Acute                                                      echogenic                     +---------+---------------+---------+-----------+---------------+--------------+ PTV      Partial                            softly         Acute                                                      echogenic                     +---------+---------------+---------+-----------+---------------+--------------+ PERO     Partial                                                          +---------+---------------+---------+-----------+---------------+--------------+ GSV      Full                                                             +---------+---------------+---------+-----------+---------------+--------------+     Summary: RIGHT: - There is no evidence of deep vein thrombosis in the lower extremity.  - No cystic structure found in the popliteal fossa.  LEFT: - Findings consistent with acute deep vein thrombosis involving the left popliteal vein, and left posterior tibial veins. - No cystic structure found in the popliteal fossa.  *See table(s) above for measurements and observations. Electronically signed by Curt Jews MD on 10/10/2020 at 9:43:09 PM.    Final     Microbiology: Recent Results (from the past 240 hour(s))  Resp Panel by RT-PCR (Flu A&B, Covid) Nasopharyngeal Swab     Status: None   Collection Time: 10/09/20  4:53 PM   Specimen: Nasopharyngeal Swab;  Nasopharyngeal(NP) swabs in vial transport medium  Result Value Ref Range Status   SARS Coronavirus 2 by RT PCR NEGATIVE NEGATIVE Final    Comment: (NOTE) SARS-CoV-2 target nucleic acids are NOT DETECTED.  The SARS-CoV-2 RNA is generally detectable in upper respiratory  specimens during the acute phase of infection. The lowest concentration of SARS-CoV-2 viral copies this assay can detect is 138 copies/mL. A negative result does not preclude SARS-Cov-2 infection and should not be used as the sole basis for treatment or other patient management decisions. A negative result may occur with  improper specimen collection/handling, submission of specimen other than nasopharyngeal swab, presence of viral mutation(s) within the areas targeted by this assay, and inadequate number of viral copies(<138 copies/mL). A negative result must be combined with clinical observations, patient history, and epidemiological information. The expected result is Negative.  Fact Sheet for Patients:  EntrepreneurPulse.com.au  Fact Sheet for Healthcare Providers:  IncredibleEmployment.be  This test is no t yet approved or cleared by the [place] FDA and  has been authorized for detection and/or diagnosis of SARS-CoV-2 by FDA under an Emergency Use Authorization (EUA). This EUA will remain  in effect (meaning this test can be used) for the duration of the COVID-19 declaration under Section 564(b)(1) of the Act, 21 U.S.C.section 360bbb-3(b)(1), unless the authorization is terminated  or revoked sooner.       Influenza A by PCR NEGATIVE NEGATIVE Final   Influenza B by PCR NEGATIVE NEGATIVE Final    Comment: (NOTE) The Xpert Xpress SARS-CoV-2/FLU/RSV plus assay is intended as an aid in the diagnosis of influenza from Nasopharyngeal swab specimens and should not be used as a sole basis for treatment. Nasal washings and aspirates are unacceptable for Xpert Xpress  SARS-CoV-2/FLU/RSV testing.  Fact Sheet for Patients: EntrepreneurPulse.com.au  Fact Sheet for Healthcare Providers: IncredibleEmployment.be  This test is not yet approved or cleared by the [place] FDA and has been authorized for detection and/or diagnosis of SARS-CoV-2 by FDA under an Emergency Use Authorization (EUA). This EUA will remain in effect (meaning this test can be used) for the duration of the COVID-19 declaration under Section 564(b)(1) of the Act, 21 U.S.C. section 360bbb-3(b)(1), unless the authorization is terminated or revoked.  Performed at [facility], [address] [place], [place] [address]   MRSA PCR Screening     Status: None   Collection Time: 10/09/20 10:38 PM   Specimen: Nasopharyngeal  Result Value Ref Range Status   MRSA by PCR NEGATIVE NEGATIVE Final    Comment:        The GeneXpert MRSA Assay (FDA approved for NASAL specimens only), is one component of a comprehensive MRSA colonization surveillance program. It is not intended to diagnose MRSA infection nor to guide or monitor treatment for MRSA infections. Performed at [facility], [place] [address], [place], [place] [address]      Labs: Basic Metabolic Panel: Recent Labs  Lab 10/09/20 1543 10/10/20 0219 10/11/20 0053 10/12/20 0202  NA 133* 136 134* 136  K 3.7 3.6 3.9 3.8  CL 99 107 106 108  CO2 20* 20* 19* 19*  GLUCOSE 326* 214* 187* 172*  BUN 22 18 22 17   CREATININE 1.47* 1.00 1.25* 1.19  CALCIUM 9.5 9.2 8.8* 8.9  MG  --  2.1  --   --   PHOS  --  3.8  --   --    Liver Function Tests: Recent Labs  Lab 10/09/20 1543  AST 26  ALT 26  ALKPHOS 38  BILITOT 1.0  PROT 7.2  ALBUMIN 3.6   No results for input(s): LIPASE, AMYLASE in the last 168 hours. No results for input(s): AMMONIA in the last 168 hours. CBC: Recent Labs  Lab 10/09/20 1543 10/10/20 0981  10/10/20 0746 10/11/20 0053 10/12/20 0202  WBC 11.5* 9.1 9.2 6.3  6.7  NEUTROABS 7.8*  --   --   --   --   HGB 15.8 14.8 14.9 14.4 14.8  HCT 46.8 41.5 42.6 40.9 41.6  MCV 91.6 87.7 89.1 88.9 88.3  PLT 125* 96* 94* 103* 116*   Cardiac Enzymes: No results for input(s): CKTOTAL, CKMB, CKMBINDEX, TROPONINI in the last 168 hours. BNP: BNP (last 3 results) Recent Labs    10/09/20 1543  BNP 41.0    ProBNP (last 3 results) No results for input(s): PROBNP in the last 8760 hours.  CBG: Recent Labs  Lab 10/11/20 0725 10/11/20 1129 10/11/20 1717 10/11/20 2004 10/12/20 0739  GLUCAP 186* 208* 210* 202* 270*       Signed:  Alma Friendly, MD [facility] 10/12/2020, 10:40 AM

## 2020-10-13 NOTE — Telephone Encounter (Signed)
[place] elastography scheduled for 4/8 at 9:30am, arrival 9:15am, npo midnight  Called pt and made aware of appt details.

## 2020-10-18 DIAGNOSIS — I2699 Other pulmonary embolism without acute cor pulmonale: Secondary | ICD-10-CM | POA: Diagnosis not present

## 2020-10-18 DIAGNOSIS — D696 Thrombocytopenia, unspecified: Secondary | ICD-10-CM | POA: Diagnosis not present

## 2020-10-18 DIAGNOSIS — R946 Abnormal results of thyroid function studies: Secondary | ICD-10-CM | POA: Diagnosis not present

## 2020-10-18 DIAGNOSIS — U071 COVID-19: Secondary | ICD-10-CM | POA: Diagnosis not present

## 2020-10-18 DIAGNOSIS — Z6826 Body mass index (BMI) 26.0-26.9, adult: Secondary | ICD-10-CM | POA: Diagnosis not present

## 2020-10-18 DIAGNOSIS — B182 Chronic viral hepatitis C: Secondary | ICD-10-CM | POA: Diagnosis not present

## 2020-10-20 ENCOUNTER — Ambulatory Visit (HOSPITAL_COMMUNITY)
Admission: RE | Admit: 2020-10-20 | Discharge: 2020-10-20 | Disposition: A | Discharge status: Home / Self Care | Payer: PPO | Source: Ambulatory Visit | Attending: Nurse Practitioner | Admitting: Nurse Practitioner

## 2020-10-20 DIAGNOSIS — R1030 Lower abdominal pain, unspecified: Secondary | ICD-10-CM | POA: Insufficient documentation

## 2020-10-20 DIAGNOSIS — Z8619 Personal history of other infectious and parasitic diseases: Secondary | ICD-10-CM | POA: Diagnosis not present

## 2020-10-20 DIAGNOSIS — K7460 Unspecified cirrhosis of liver: Secondary | ICD-10-CM | POA: Diagnosis not present

## 2020-10-20 DIAGNOSIS — K760 Fatty (change of) liver, not elsewhere classified: Secondary | ICD-10-CM | POA: Insufficient documentation

## 2020-10-20 DIAGNOSIS — K219 Gastro-esophageal reflux disease without esophagitis: Secondary | ICD-10-CM | POA: Insufficient documentation

## 2020-10-20 DIAGNOSIS — K7689 Other specified diseases of liver: Secondary | ICD-10-CM | POA: Diagnosis not present

## 2020-10-20 DIAGNOSIS — B182 Chronic viral hepatitis C: Secondary | ICD-10-CM | POA: Diagnosis not present

## 2020-10-24 ENCOUNTER — Encounter: Payer: Self-pay | Admitting: Internal Medicine

## 2020-11-11 DIAGNOSIS — E1165 Type 2 diabetes mellitus with hyperglycemia: Secondary | ICD-10-CM | POA: Diagnosis not present

## 2020-11-11 DIAGNOSIS — E7849 Other hyperlipidemia: Secondary | ICD-10-CM | POA: Diagnosis not present

## 2020-11-11 DIAGNOSIS — I10 Essential (primary) hypertension: Secondary | ICD-10-CM | POA: Diagnosis not present

## 2020-11-13 ENCOUNTER — Encounter: Payer: Self-pay | Admitting: Internal Medicine

## 2020-11-13 ENCOUNTER — Ambulatory Visit: Payer: PPO | Admitting: Internal Medicine

## 2020-11-13 ENCOUNTER — Other Ambulatory Visit: Payer: Self-pay

## 2020-11-13 DIAGNOSIS — I2699 Other pulmonary embolism without acute cor pulmonale: Secondary | ICD-10-CM

## 2020-11-13 NOTE — Patient Instructions (Addendum)
Your lungs have recovered completely and no need for any pulmonary follow.  I recommend 6 months of eliquis full strength = 41m one pill  twice daily  then proceed with a venous doppler of both legs to be sure the blood clots have healed and if so let Dr GHilma Favorsrefer you to a hematologist for hypercoagulability evaluation.   Pulmonary follow up is as needed

## 2020-11-13 NOTE — Progress Notes (Signed)
Stephen Stephenson, male    DOB: 1951-01-24    MRN: [id number]   Brief patient profile:  71  yowm  1997 due to health insurance issues at wt 185 and breathing fine until [facility] Nov 2021 rx short prednisone and felt fine until onset of L Leg pain late march 2022  then syncope:    Admit date: 10/09/2020 Discharge date: 10/12/2020  Discharge Diagnoses:      Active Hospital Problems   Diagnosis Date Noted  . Right shoulder pain 10/10/2020  . Pulmonary emboli ([place]) 10/09/2020  . Acute pulmonary embolism ([place]) 10/09/2020    Resolved Hospital Problems  No resolved problems to display.     History of present illness:  Stephen Stephenson [facility] to the [facility] ED on 3/28 after two syncopal episodes, one of which caused him to hit his head. He also complained of R-sided anterior chest pain. ED workup revealed large PE with R heart strain. CT Head was negative for intracranial bleed and he was treated with 1/2 dose TPA and started on heparin gtt. He was transferred to the [facility] ICU due to hemodynamic instability.   Hospital Course:  Active Problems:   Pulmonary emboli (HCC)   Acute pulmonary embolism (HCC)   Right shoulder pain   Acute submassive PE with hemodynamic compromise/right heart strain on admission LLE DVT S/p TPA on 3/28  Echo showedEF of 60 to 93%, grade 1 diastolic dysfunction LE Doppler showed left popliteal, posterior tibial DVT S/p IV Heparin, switched to p.o. apixaban Outpatient hypercoagulability work-up    Syncope with brief LOC None since admission Likely 2/2 above  Acute hypoxic respiratory failure likely 2/2 submassive PE Resolved, currently on room air  Acute on chronic right shoulder pain X-ray with diffuse OA but no acute dislocation or fracture Pain management  Hypertension Continue ARB  Diabetes mellitus type 2,uncontrolled with hyperglycemia Last A1c 7.4 on 10/10/2020 Continue home regimen  History of hepatitis C NASH Patient was treated in  the 90s Continue outpatient follow-up  GERD Continue PPI        History of Present Illness  11/13/2020  Pulmonary/ 1st office eval/ Stephen Stephenson / [facility]  Chief Complaint  Patient presents with  . Hospital Followup     Breathing is back to normal baseline and no respiratory co's.   Dyspnea:  Not limited by breathing from desired activities   Cough: none  Sleep: ok flat  SABA use: non No leg pain or swelling   No obvious day to day or daytime variability or assoc excess/ purulent sputum or mucus plugs or hemoptysis or cp or chest tightness, subjective wheeze or overt sinus or hb symptoms.   Sleeping  without nocturnal  or early am exacerbation  of respiratory  c/o's or need for noct saba. Also denies any obvious fluctuation of symptoms with weather or environmental changes or other aggravating or alleviating factors except as outlined above   No unusual exposure hx or h/o childhood pna/ asthma or knowledge of premature birth.  Current Allergies, Complete Past Medical History, Past Surgical History, Family History, and Social History were reviewed in [employer] record.  ROS  The following are not active complaints unless bolded Hoarseness, sore throat, dysphagia, dental problems, itching, sneezing,  nasal congestion or discharge of excess mucus or purulent secretions, ear ache,   fever, chills, sweats, unintended wt loss or wt gain, classically pleuritic or exertional cp,  orthopnea pnd or arm/hand swelling  or leg swelling, presyncope, palpitations, abdominal  pain, anorexia, nausea, vomiting, diarrhea  or change in bowel habits or change in bladder habits, change in stools or change in urine, dysuria, hematuria,  rash, arthralgias, visual complaints, headache, numbness, weakness or ataxia or problems with walking or coordination,  change in mood or  memory.           Past Medical History:  Diagnosis Date  . DM (diabetes mellitus) ([place])    type II  .  GERD (gastroesophageal reflux disease)   . Hb-AS genotype ([place])   . Hepatitis C    genotype 1 eradicated  . IBS (irritable bowel syndrome)   . Insomnia   . Kidney stone   . NASH (nonalcoholic steatohepatitis)    Metavir F3, 10/2013, patient reports completing hepatitis a and B vaccination. 2016    Outpatient Medications Prior to Visit  Medication Sig Dispense Refill  . APIXABAN (ELIQUIS) VTE STARTER PACK (10MG AND 5MG) Take as directed on package: start with two-71m tablets twice daily for 7 days. On day 8, switch to one-579mtablet twice daily. 1 each 0  . glimepiride (AMARYL) 4 MG tablet Take 4 mg by mouth daily with breakfast.    . JANUMET 50-1000 MG tablet Take 1 tablet by mouth 2 (two) times daily.    . Marland Kitchenlmesartan (BENICAR) 40 MG tablet Take 40 mg by mouth daily.    . pantoprazole (PROTONIX) 40 MG tablet Take 1 tablet (40 mg total) by mouth daily. 30 tablet 3  . potassium citrate (UROCIT-K) 10 MEQ (1080 MG) SR tablet Take 10 mEq by mouth 2 (two) times daily.     . Marland Kitchenriamcinolone (KENALOG) 0.1 % Apply 1 application topically 2 (two) times daily. APPLY CREAM EXTERNALLY TWICE DAILY FOR UP TO 2 CONTINUOUS WEEKS THEN TAKE A 2 WEEK BREAK BEFORE RESTARTING.    . Marland Kitchenoxycycline (VIBRA-TABS) 100 MG tablet Take 100 mg by mouth daily. Starting 3.16.22 x 30 days for eyes.    . Marland Kitchenrythromycin ophthalmic ointment Place 1 application into both eyes at bedtime. Staring 3.16.22 for eyes.     No facility-administered medications prior to visit.     Objective:     BP 140/80 (BP Location: Left Arm, Cuff Size: Normal)   Pulse 90   Temp (!) 97.2 F (36.2 C) (Temporal)   Ht 6' (1.829 m)   Wt 211 lb (95.7 kg)   SpO2 98% Comment: on RA  BMI 28.62 kg/m   SpO2: 98 % (on RA)   Pleasant amb wm nad   HEENT : pt wearing mask not removed for exam due to covid -19 concerns.    NECK :  without JVD/Nodes/TM/ nl carotid upstrokes bilaterally   LUNGS: no acc muscle use,  Nl contour chest which is clear to  A and P bilaterally without cough on insp or exp maneuvers   CV:  RRR  no s3 or murmur or increase in P2, and no edema   ABD:  Somewhat of a "pot belly" contour/ soft and nontender with nl inspiratory excursion in the supine position. No bruits or organomegaly appreciated, bowel sounds nl  MS:  Nl gait/ ext warm without deformities, calf tenderness, cyanosis or clubbing No obvious joint restrictions   SKIN: warm and dry without lesions    NEURO:  alert, approp, nl sensorium with  no motor or cerebellar deficits apparent.      I personally reviewed images and agree with radiology impression as follows:   Chest CTa 10/09/20 1. Bilateral acute occlusive pulmonary emboli. Overall clot  burden is severe with evidence of RIGHT heart strain (RV/LV Ratio = 2.78) consistent with at least submassive (intermediate risk) PE. The presence of right heart strain has been associated with an increased risk of morbidity and mortality. 2. No evidence of pulmonary infarction.     Assessment   No problem-specific Assessment & Plan notes found for this encounter.     Christinia Gully, MD 11/13/2020

## 2020-11-13 NOTE — Assessment & Plan Note (Signed)
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  Total time for H and P, chart review, counseling, and generating customized AVS unique to this new pt post hosp office visit / same day charting = 49 min

## 2020-11-16 DIAGNOSIS — H0102A Squamous blepharitis right eye, upper and lower eyelids: Secondary | ICD-10-CM | POA: Diagnosis not present

## 2020-11-16 DIAGNOSIS — H01006 Unspecified blepharitis left eye, unspecified eyelid: Secondary | ICD-10-CM | POA: Diagnosis not present

## 2020-11-16 DIAGNOSIS — H04123 Dry eye syndrome of bilateral lacrimal glands: Secondary | ICD-10-CM | POA: Diagnosis not present

## 2020-11-16 DIAGNOSIS — H40013 Open angle with borderline findings, low risk, bilateral: Secondary | ICD-10-CM | POA: Diagnosis not present

## 2020-11-16 DIAGNOSIS — H0102B Squamous blepharitis left eye, upper and lower eyelids: Secondary | ICD-10-CM | POA: Diagnosis not present

## 2020-12-01 DIAGNOSIS — Z6827 Body mass index (BMI) 27.0-27.9, adult: Secondary | ICD-10-CM | POA: Diagnosis not present

## 2020-12-01 DIAGNOSIS — D696 Thrombocytopenia, unspecified: Secondary | ICD-10-CM | POA: Diagnosis not present

## 2020-12-01 DIAGNOSIS — E663 Overweight: Secondary | ICD-10-CM | POA: Diagnosis not present

## 2020-12-01 DIAGNOSIS — R946 Abnormal results of thyroid function studies: Secondary | ICD-10-CM | POA: Diagnosis not present

## 2020-12-01 DIAGNOSIS — I2699 Other pulmonary embolism without acute cor pulmonale: Secondary | ICD-10-CM | POA: Diagnosis not present

## 2020-12-01 DIAGNOSIS — E118 Type 2 diabetes mellitus with unspecified complications: Secondary | ICD-10-CM | POA: Diagnosis not present

## 2021-01-09 ENCOUNTER — Ambulatory Visit: Payer: PPO | Admitting: Nurse Practitioner

## 2021-01-16 ENCOUNTER — Encounter: Payer: Self-pay | Admitting: Internal Medicine

## 2021-01-16 ENCOUNTER — Ambulatory Visit: Payer: PPO | Admitting: Gastroenterology

## 2021-03-02 ENCOUNTER — Other Ambulatory Visit: Payer: Self-pay | Admitting: Nurse Practitioner

## 2021-03-02 DIAGNOSIS — Z1331 Encounter for screening for depression: Secondary | ICD-10-CM | POA: Diagnosis not present

## 2021-03-02 DIAGNOSIS — Z6839 Body mass index (BMI) 39.0-39.9, adult: Secondary | ICD-10-CM | POA: Diagnosis not present

## 2021-03-02 DIAGNOSIS — Z0001 Encounter for general adult medical examination with abnormal findings: Secondary | ICD-10-CM | POA: Diagnosis not present

## 2021-03-02 DIAGNOSIS — Z8619 Personal history of other infectious and parasitic diseases: Secondary | ICD-10-CM

## 2021-03-02 DIAGNOSIS — E559 Vitamin D deficiency, unspecified: Secondary | ICD-10-CM | POA: Diagnosis not present

## 2021-03-02 DIAGNOSIS — K219 Gastro-esophageal reflux disease without esophagitis: Secondary | ICD-10-CM

## 2021-03-02 DIAGNOSIS — R1030 Lower abdominal pain, unspecified: Secondary | ICD-10-CM

## 2021-03-02 DIAGNOSIS — I10 Essential (primary) hypertension: Secondary | ICD-10-CM | POA: Diagnosis not present

## 2021-03-02 DIAGNOSIS — E118 Type 2 diabetes mellitus with unspecified complications: Secondary | ICD-10-CM | POA: Diagnosis not present

## 2021-03-02 DIAGNOSIS — E782 Mixed hyperlipidemia: Secondary | ICD-10-CM | POA: Diagnosis not present

## 2021-03-02 DIAGNOSIS — K760 Fatty (change of) liver, not elsewhere classified: Secondary | ICD-10-CM

## 2021-03-02 DIAGNOSIS — R946 Abnormal results of thyroid function studies: Secondary | ICD-10-CM | POA: Diagnosis not present

## 2021-03-02 DIAGNOSIS — K7460 Unspecified cirrhosis of liver: Secondary | ICD-10-CM

## 2021-03-02 DIAGNOSIS — Z1389 Encounter for screening for other disorder: Secondary | ICD-10-CM | POA: Diagnosis not present

## 2021-03-02 DIAGNOSIS — D696 Thrombocytopenia, unspecified: Secondary | ICD-10-CM | POA: Diagnosis not present

## 2021-05-14 DIAGNOSIS — Z23 Encounter for immunization: Secondary | ICD-10-CM | POA: Diagnosis not present

## 2021-05-14 DIAGNOSIS — E1140 Type 2 diabetes mellitus with diabetic neuropathy, unspecified: Secondary | ICD-10-CM | POA: Diagnosis not present

## 2021-05-14 DIAGNOSIS — E782 Mixed hyperlipidemia: Secondary | ICD-10-CM | POA: Diagnosis not present

## 2021-05-14 DIAGNOSIS — D696 Thrombocytopenia, unspecified: Secondary | ICD-10-CM | POA: Diagnosis not present

## 2021-05-14 DIAGNOSIS — E559 Vitamin D deficiency, unspecified: Secondary | ICD-10-CM | POA: Diagnosis not present

## 2021-05-14 DIAGNOSIS — B182 Chronic viral hepatitis C: Secondary | ICD-10-CM | POA: Diagnosis not present

## 2021-05-14 DIAGNOSIS — Z6828 Body mass index (BMI) 28.0-28.9, adult: Secondary | ICD-10-CM | POA: Diagnosis not present

## 2021-05-14 DIAGNOSIS — Z024 Encounter for examination for driving license: Secondary | ICD-10-CM | POA: Diagnosis not present

## 2021-05-14 DIAGNOSIS — I10 Essential (primary) hypertension: Secondary | ICD-10-CM | POA: Diagnosis not present

## 2021-05-14 DIAGNOSIS — E663 Overweight: Secondary | ICD-10-CM | POA: Diagnosis not present

## 2021-05-23 ENCOUNTER — Ambulatory Visit: Payer: PPO | Admitting: Gastroenterology

## 2021-05-23 ENCOUNTER — Encounter: Payer: Self-pay | Admitting: Gastroenterology

## 2021-05-23 ENCOUNTER — Other Ambulatory Visit: Payer: Self-pay

## 2021-05-23 ENCOUNTER — Telehealth: Payer: Self-pay | Admitting: Gastroenterology

## 2021-05-23 VITALS — BP 166/84 | HR 88 | Temp 96.8°F | Ht 72.0 in | Wt 209.2 lb

## 2021-05-23 DIAGNOSIS — K7400 Hepatic fibrosis, unspecified: Secondary | ICD-10-CM | POA: Insufficient documentation

## 2021-05-23 DIAGNOSIS — K219 Gastro-esophageal reflux disease without esophagitis: Secondary | ICD-10-CM

## 2021-05-23 NOTE — Patient Instructions (Signed)
We are arranging an ultrasound of your liver. If all looks well, we can just do this yearly.  We will see you in 1 year or sooner if needed!  I enjoyed seeing you again today! As you know, I value our relationship and want to provide genuine, compassionate, and quality care. I welcome your feedback. If you receive a survey regarding your visit,  I greatly appreciate you taking time to fill this out. See you next time!  [name], PhD, ANP-BC [facility]

## 2021-05-23 NOTE — Progress Notes (Signed)
Referring Provider: Sharilyn Sites, MD Primary Care Physician:  Sharilyn Sites, MD Primary GI: Dr. Gala Romney   Chief Complaint  Patient presents with   Irritable Bowel Syndrome    Alternates diarrhea/constipation   Abdominal Pain    Occas when he has diarrhea    HPI:   Stephen Stephenson is a 70 y.o. male presenting today with a history of  Hep C s/p eradication and increased fibrosis in past (Metavir 3).  Elastography in April 2022 with kPa 4.8, improved. Portal gastropathy on EGD in 2019. Due for surveillance now. Colonoscopy up-to-date as of 2021 and no further needed unless new symptoms develop.   Patient wants to hold off on EGD. No dysphagia. Chronic issues with alternating diarrhea/constipation. Doesn't affect quality of life. On metformin. Rare days of skipping BMs. Usually has BM once daily. Has taken fiber years ago. Thinking about starting it again. No overt GI bleeding. No mental status changes. 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. Difficulty sleeping at night, which has been longstanding.   Protonix once daily for reflux.      Past Medical History:  Diagnosis Date   DM (diabetes mellitus) ([place])    type II   GERD (gastroesophageal reflux disease)    Hb-AS genotype ([place])    Hepatitis C    genotype 1 eradicated   IBS (irritable bowel syndrome)    Insomnia    Kidney stone    NASH (nonalcoholic steatohepatitis)    Metavir F3, 10/2013, patient reports completing hepatitis a and B vaccination. 2016    Past Surgical History:  Procedure Laterality Date   BACK SURGERY     CHOLECYSTECTOMY     COLONOSCOPY  02/05/2002   anal canal herrhoids ow normal/normal ileum/suspect pt bled from hemorrhoids presently   COLONOSCOPY  09/27/2011   MOQ:HUTMLYYT hemorrhoids. Hepatic flexure polyp-removed TUBULAR ADENOMA, due for surveillance March 2016   COLONOSCOPY N/A 01/20/2015   KPT:WSFKCL colonic polyp removed/colonic diverticulosis   COLONOSCOPY N/A 07/05/2020   single 3  mm polyp at the rectosigmoid colon and a 7 mm innocent appearing AVM at the base of the cecum, otherwise normal.  Surgical pathology found the polyp to be hyperplastic.  Recommended no future colonoscopy unless new symptoms develop.   CYSTOSCOPY W/ URETERAL STENT PLACEMENT Left 02/03/2013   Procedure: CYSTOSCOPY WITH RETROGRADE PYELOGRAM/URETERAL STENT PLACEMENT;  Surgeon: Marissa Nestle, MD;  Location: [facility];  Service: Urology;  Laterality: Left;   ESOPHAGOGASTRODUODENOSCOPY  05/17/2005   normal esophagus/gastric mucosa appeared normal   ESOPHAGOGASTRODUODENOSCOPY N/A 04/25/2014   Abnormal gastric mucosa, benign biopsies. No esophageal varices. Next EGD October 2018.   ESOPHAGOGASTRODUODENOSCOPY (EGD) WITH PROPOFOL N/A 01/08/2018   nonerosive reflux esophagitis status post Maloney dilation, portal hypertensive gastropathy, normal duodenum. Surveillance was due 2021   EXTRACORPOREAL SHOCK WAVE LITHOTRIPSY Left 02/03/2013   Procedure: EXTRACORPOREAL SHOCK WAVE LITHOTRIPSY (ESWL) LEFT RENAL CALCULUS;  Surgeon: Marissa Nestle, MD;  Location: [facility];  Service: Urology;  Laterality: Left;   KIDNEY STONE SURGERY     MALONEY DILATION N/A 01/08/2018   Procedure: Venia Minks DILATION;  Surgeon: [name], MD;  Location: [facility] END[facility] SU[facility];  Service: Endoscopy;  Laterality: N/A;   POLYPECTOMY  07/05/2020   Procedure: POLYPECTOMY;  Surgeon: [name], MD;  Location: [facility];  Service: Endoscopy;;  recto-sigmoid    SHOULDER SURGERY      Current Outpatient Medications  Medication Sig Dispense Refill   APIXABAN (ELIQUIS) VTE STARTER PACK (10MG  AND 5MG) Take as directed on package: start with two-50m tablets twice daily for 7 days. On day 8, switch to one-573mtablet twice daily. (Patient taking differently: 2.61m77mwice daily) 1 each 0   glimepiride (AMARYL) 4 MG tablet Take 4 mg by mouth daily with breakfast.     JANUMET 50-1000 MG tablet Take 1 tablet by mouth 2 (two) times daily.      olmesartan (BENICAR) 40 MG tablet Take 40 mg by mouth daily.     pantoprazole (PROTONIX) 40 MG tablet Take 1 tablet by mouth once daily 30 tablet 11   potassium citrate (UROCIT-K) 10 MEQ (1080 MG) SR tablet Take 10 mEq by mouth 2 (two) times daily.      triamcinolone (KENALOG) 0.1 % Apply 1 application topically 2 (two) times daily. APPLY CREAM EXTERNALLY TWICE DAILY FOR UP TO 2 CONTINUOUS WEEKS THEN TAKE A 2 WEEK BREAK BEFORE RESTARTING.     No current facility-administered medications for this visit.    Allergies as of 05/23/2021   (No Known Allergies)    Family History  Problem Relation Age of Onset   Liver disease Father        ? Hepatitis C; Alcoholic   Colon cancer Neg Hx     Social History   Socioeconomic History   Marital status: Married    Spouse name: Not on file   Number of children: Not on file   Years of education: Not on file   Highest education level: Not on file  Occupational History   Not on file  Tobacco Use   Smoking status: Former    Packs/day: 1.00    Years: 25.00    Pack years: 25.00    Types: Cigarettes    Quit date: 08/02/1995    Years since quitting: 25.8   Smokeless tobacco: Former    Quit date: 10/12/1992  Vaping Use   Vaping Use: Never used  Substance and Sexual Activity   Alcohol use: Not Currently    Alcohol/week: 0.0 standard drinks    Comment: Occasionally; Average one drink every few months; Summer will occasionally drink a bottle of wine.   Drug use: No   Sexual activity: Yes    Birth control/protection: None  Other Topics Concern   Not on file  Social History Narrative   Not on file   Social Determinants of Health   Financial Resource Strain: Not on file  Food Insecurity: Not on file  Transportation Needs: Not on file  Physical Activity: Not on file  Stress: Not on file  Social Connections: Not on file    Review of Systems: Gen: Denies fever, chills, anorexia. Denies fatigue, weakness, weight loss.  CV: Denies chest  pain, palpitations, syncope, peripheral edema, and claudication. Resp: Denies dyspnea at rest, cough, wheezing, coughing up blood, and pleurisy. GI: see HPI Derm: Denies rash, itching, dry skin Psych: Denies depression, anxiety, memory loss, confusion. No homicidal or suicidal ideation.  Heme: Denies bruising, bleeding, and enlarged lymph nodes.  Physical Exam: BP (!) 166/84   Pulse 88   Temp (!) 96.8 F (36 C)   Ht 6' (1.829 m)   Wt 209 lb 3.2 oz (94.9 kg)   BMI 28.37 kg/m  General:   Alert and oriented. No distress noted. Pleasant and cooperative.  Head:  Normocephalic and atraumatic. Eyes:  Conjuctiva clear without scleral icterus. Mouth:  mask in place Abdomen:  +BS, soft, non-tender and non-distended. No rebound or guarding. No HSM. Ventral hernia noted.  Msk:  Symmetrical without gross deformities. Normal posture. Extremities:  Without edema. Neurologic:  Alert and  oriented x4 Psych:  Alert and cooperative. Normal mood and affect.  ASSESSMENT: Stephen Stephenson is a 70 y.o. male presenting today with a history of  Hep C s/p eradication and increased fibrosis in past (Metavir 3).  Elastography in April 2022 with kPa 4.8, improved. Portal gastropathy on EGD in 2019. Colonoscopy up-to-date as of 2021 and no further needed unless new symptoms develop.   History of fibrosis, possible early cirrhosis: encouraging that elastography was normal. He is wanting to hold off on EGD currently. Agreeable to US abdomen complete. If this is normal, we can consider yearly surveillance.  I do recommend EGD, but he is desiring to hold off.   GERD well managed on Protonix daily. NO alarm signs/symptoms.     PLAN:  Check CBC, CMP, INR US abdomen complete now PPI daily Recommend EGD but patient declining currently 1 year follow-up   [name], PhD, ANP-BC [facility]

## 2021-05-23 NOTE — Telephone Encounter (Signed)
Dena:  After patient left, I realized we didn't have any recent labs. I placed orders for CBC, CMP, INR to have done at his convenience.

## 2021-05-23 NOTE — Telephone Encounter (Signed)
FYI: Phoned and spoke with the pt regarding his labs and pt is ok with me putting the forms in the mail to him. Mailing out today

## 2021-06-11 ENCOUNTER — Ambulatory Visit (HOSPITAL_COMMUNITY)
Admission: RE | Admit: 2021-06-11 | Discharge: 2021-06-11 | Disposition: A | Discharge status: Home / Self Care | Payer: PPO | Source: Ambulatory Visit | Attending: Gastroenterology | Admitting: Gastroenterology

## 2021-06-11 ENCOUNTER — Other Ambulatory Visit: Payer: Self-pay

## 2021-06-11 DIAGNOSIS — K7400 Hepatic fibrosis, unspecified: Secondary | ICD-10-CM | POA: Diagnosis not present

## 2021-06-11 DIAGNOSIS — K7689 Other specified diseases of liver: Secondary | ICD-10-CM | POA: Diagnosis not present

## 2021-06-14 DIAGNOSIS — N200 Calculus of kidney: Secondary | ICD-10-CM | POA: Diagnosis not present

## 2021-08-10 DIAGNOSIS — E663 Overweight: Secondary | ICD-10-CM | POA: Diagnosis not present

## 2021-08-10 DIAGNOSIS — S43402A Unspecified sprain of left shoulder joint, initial encounter: Secondary | ICD-10-CM | POA: Diagnosis not present

## 2021-08-10 DIAGNOSIS — E118 Type 2 diabetes mellitus with unspecified complications: Secondary | ICD-10-CM | POA: Diagnosis not present

## 2021-08-10 DIAGNOSIS — E1140 Type 2 diabetes mellitus with diabetic neuropathy, unspecified: Secondary | ICD-10-CM | POA: Diagnosis not present

## 2021-08-10 DIAGNOSIS — D696 Thrombocytopenia, unspecified: Secondary | ICD-10-CM | POA: Diagnosis not present

## 2021-08-10 DIAGNOSIS — Z6828 Body mass index (BMI) 28.0-28.9, adult: Secondary | ICD-10-CM | POA: Diagnosis not present

## 2021-08-10 DIAGNOSIS — B182 Chronic viral hepatitis C: Secondary | ICD-10-CM | POA: Diagnosis not present

## 2021-08-10 DIAGNOSIS — E559 Vitamin D deficiency, unspecified: Secondary | ICD-10-CM | POA: Diagnosis not present

## 2021-08-13 DIAGNOSIS — E119 Type 2 diabetes mellitus without complications: Secondary | ICD-10-CM | POA: Diagnosis not present

## 2021-08-13 DIAGNOSIS — H25813 Combined forms of age-related cataract, bilateral: Secondary | ICD-10-CM | POA: Diagnosis not present

## 2021-08-13 DIAGNOSIS — H40013 Open angle with borderline findings, low risk, bilateral: Secondary | ICD-10-CM | POA: Diagnosis not present

## 2021-08-13 DIAGNOSIS — H0102B Squamous blepharitis left eye, upper and lower eyelids: Secondary | ICD-10-CM | POA: Diagnosis not present

## 2021-08-13 DIAGNOSIS — H43393 Other vitreous opacities, bilateral: Secondary | ICD-10-CM | POA: Diagnosis not present

## 2021-08-13 DIAGNOSIS — H04123 Dry eye syndrome of bilateral lacrimal glands: Secondary | ICD-10-CM | POA: Diagnosis not present

## 2021-08-13 DIAGNOSIS — H0102A Squamous blepharitis right eye, upper and lower eyelids: Secondary | ICD-10-CM | POA: Diagnosis not present

## 2021-08-20 ENCOUNTER — Encounter: Payer: Self-pay | Admitting: Gastroenterology

## 2021-11-12 DIAGNOSIS — E118 Type 2 diabetes mellitus with unspecified complications: Secondary | ICD-10-CM | POA: Diagnosis not present

## 2021-11-12 DIAGNOSIS — E782 Mixed hyperlipidemia: Secondary | ICD-10-CM | POA: Diagnosis not present

## 2021-11-12 DIAGNOSIS — Z6829 Body mass index (BMI) 29.0-29.9, adult: Secondary | ICD-10-CM | POA: Diagnosis not present

## 2021-11-12 DIAGNOSIS — E559 Vitamin D deficiency, unspecified: Secondary | ICD-10-CM | POA: Diagnosis not present

## 2021-11-12 DIAGNOSIS — I10 Essential (primary) hypertension: Secondary | ICD-10-CM | POA: Diagnosis not present

## 2021-11-12 DIAGNOSIS — E663 Overweight: Secondary | ICD-10-CM | POA: Diagnosis not present

## 2021-11-14 ENCOUNTER — Telehealth: Payer: Self-pay | Admitting: Internal Medicine

## 2021-11-14 ENCOUNTER — Encounter: Payer: Self-pay | Admitting: Internal Medicine

## 2021-11-14 NOTE — Telephone Encounter (Signed)
Recall sent 

## 2021-11-14 NOTE — Telephone Encounter (Signed)
Recall for ultrasound 

## 2021-11-22 ENCOUNTER — Encounter: Payer: Self-pay | Admitting: *Deleted

## 2021-11-22 DIAGNOSIS — K7400 Hepatic fibrosis, unspecified: Secondary | ICD-10-CM

## 2021-11-22 NOTE — Telephone Encounter (Signed)
Patient called in to get his [place] scheduled.  ?Called CS and got scheduled. ?Called pt back and made aware of appt details. ?

## 2021-12-12 DIAGNOSIS — E1165 Type 2 diabetes mellitus with hyperglycemia: Secondary | ICD-10-CM | POA: Diagnosis not present

## 2021-12-12 DIAGNOSIS — E782 Mixed hyperlipidemia: Secondary | ICD-10-CM | POA: Diagnosis not present

## 2021-12-12 DIAGNOSIS — I10 Essential (primary) hypertension: Secondary | ICD-10-CM | POA: Diagnosis not present

## 2021-12-20 DIAGNOSIS — E1159 Type 2 diabetes mellitus with other circulatory complications: Secondary | ICD-10-CM | POA: Diagnosis not present

## 2021-12-20 DIAGNOSIS — E1129 Type 2 diabetes mellitus with other diabetic kidney complication: Secondary | ICD-10-CM | POA: Diagnosis not present

## 2021-12-20 DIAGNOSIS — E785 Hyperlipidemia, unspecified: Secondary | ICD-10-CM | POA: Diagnosis not present

## 2021-12-20 DIAGNOSIS — R809 Proteinuria, unspecified: Secondary | ICD-10-CM | POA: Diagnosis not present

## 2021-12-20 DIAGNOSIS — I152 Hypertension secondary to endocrine disorders: Secondary | ICD-10-CM | POA: Diagnosis not present

## 2021-12-20 DIAGNOSIS — E1169 Type 2 diabetes mellitus with other specified complication: Secondary | ICD-10-CM | POA: Diagnosis not present

## 2021-12-25 ENCOUNTER — Ambulatory Visit (HOSPITAL_COMMUNITY)
Admission: RE | Admit: 2021-12-25 | Discharge: 2021-12-25 | Disposition: A | Discharge status: Home / Self Care | Payer: PPO | Source: Ambulatory Visit | Attending: Gastroenterology | Admitting: Gastroenterology

## 2021-12-25 DIAGNOSIS — N200 Calculus of kidney: Secondary | ICD-10-CM | POA: Diagnosis not present

## 2021-12-25 DIAGNOSIS — K7400 Hepatic fibrosis, unspecified: Secondary | ICD-10-CM | POA: Diagnosis not present

## 2021-12-25 DIAGNOSIS — I714 Abdominal aortic aneurysm, without rupture, unspecified: Secondary | ICD-10-CM | POA: Diagnosis not present

## 2021-12-25 DIAGNOSIS — K7689 Other specified diseases of liver: Secondary | ICD-10-CM | POA: Diagnosis not present

## 2021-12-25 DIAGNOSIS — R161 Splenomegaly, not elsewhere classified: Secondary | ICD-10-CM | POA: Diagnosis not present

## 2022-02-12 ENCOUNTER — Other Ambulatory Visit: Payer: Self-pay | Admitting: *Deleted

## 2022-02-12 NOTE — Patient Outreach (Signed)
  Care Management   Outreach Note  02/12/2022 Name: Stephen Stephenson MRN: [id number] DOB: [birth year]  Successful call to patient to introduce care coordination services.  Currently at work, request call back on Friday after 3pm.  Follow Up Plan:  The care management team will reach out to the patient again over the next 3 days.   Valente David, RN, MSN, [place] Manager [phone]

## 2022-02-18 ENCOUNTER — Other Ambulatory Visit: Payer: Self-pay | Admitting: *Deleted

## 2022-02-18 ENCOUNTER — Encounter: Payer: Self-pay | Admitting: *Deleted

## 2022-02-18 NOTE — Patient Outreach (Signed)
  Care Coordination   Initial Visit Note   02/18/2022 Name: Stephen Stephenson MRN: [id number] DOB: [birth year]  Stephen Stephenson is a 71 y.o. year old male who sees Sharilyn Sites, MD for primary care. I spoke with  Stephen Stephenson by phone today  What matters to the patients health and wellness today?  Controlling DM.  Medication changed several weeks ago, report average blood sugar now is 160's. Increasing exercise as tolerated.  Foot and eye exams are reportedly up to date.  Has follow up within the next few weeks with PCP and Endocrinology.    Goals Addressed               This Visit's Progress     Continue DM management (A1C 7.7) (pt-stated)        Care Coordination Interventions: Reviewed medications with patient and discussed importance of medication adherence Discussed plans with patient for ongoing care management follow up and provided patient with direct contact information for care management team Reviewed scheduled/upcoming provider appointments including: PCP and endocrinology within the next month Assessed social determinant of health barriers         SDOH assessments and interventions completed:  Yes     Care Coordination Interventions Activated:  Yes  Care Coordination Interventions:  Yes, provided   Follow up plan: No further intervention required.   Encounter Outcome:  Pt. Visit Completed   Valente David, RN, MSN, [place] Coordination [phone]

## 2022-02-18 NOTE — Patient Instructions (Signed)
Visit Information  Thank you for taking time to visit with me today. Please don't hesitate to contact me if I can be of assistance to you before our next scheduled telephone appointment.  The patient verbalized understanding of instructions, educational materials, and care plan provided today and agreed to receive a mailed copy of patient instructions, educational materials, and care plan.   The patient has been provided with contact information for the care management team and has been advised to call with any health related questions or concerns.   Valente David, RN, MSN, [facility] Care Coordinator [phone]

## 2022-03-15 DIAGNOSIS — E559 Vitamin D deficiency, unspecified: Secondary | ICD-10-CM | POA: Diagnosis not present

## 2022-03-15 DIAGNOSIS — E663 Overweight: Secondary | ICD-10-CM | POA: Diagnosis not present

## 2022-03-15 DIAGNOSIS — Z6828 Body mass index (BMI) 28.0-28.9, adult: Secondary | ICD-10-CM | POA: Diagnosis not present

## 2022-03-15 DIAGNOSIS — B182 Chronic viral hepatitis C: Secondary | ICD-10-CM | POA: Diagnosis not present

## 2022-03-15 DIAGNOSIS — E782 Mixed hyperlipidemia: Secondary | ICD-10-CM | POA: Diagnosis not present

## 2022-03-15 DIAGNOSIS — Z125 Encounter for screening for malignant neoplasm of prostate: Secondary | ICD-10-CM | POA: Diagnosis not present

## 2022-03-15 DIAGNOSIS — E118 Type 2 diabetes mellitus with unspecified complications: Secondary | ICD-10-CM | POA: Diagnosis not present

## 2022-03-15 DIAGNOSIS — E1140 Type 2 diabetes mellitus with diabetic neuropathy, unspecified: Secondary | ICD-10-CM | POA: Diagnosis not present

## 2022-03-15 DIAGNOSIS — E119 Type 2 diabetes mellitus without complications: Secondary | ICD-10-CM | POA: Diagnosis not present

## 2022-03-15 DIAGNOSIS — I10 Essential (primary) hypertension: Secondary | ICD-10-CM | POA: Diagnosis not present

## 2022-03-15 DIAGNOSIS — D696 Thrombocytopenia, unspecified: Secondary | ICD-10-CM | POA: Diagnosis not present

## 2022-03-15 DIAGNOSIS — R946 Abnormal results of thyroid function studies: Secondary | ICD-10-CM | POA: Diagnosis not present

## 2022-03-23 ENCOUNTER — Other Ambulatory Visit: Payer: Self-pay | Admitting: Gastroenterology

## 2022-03-23 DIAGNOSIS — K7460 Unspecified cirrhosis of liver: Secondary | ICD-10-CM

## 2022-03-23 DIAGNOSIS — Z8619 Personal history of other infectious and parasitic diseases: Secondary | ICD-10-CM

## 2022-03-23 DIAGNOSIS — K760 Fatty (change of) liver, not elsewhere classified: Secondary | ICD-10-CM

## 2022-03-23 DIAGNOSIS — R1030 Lower abdominal pain, unspecified: Secondary | ICD-10-CM

## 2022-03-23 DIAGNOSIS — K219 Gastro-esophageal reflux disease without esophagitis: Secondary | ICD-10-CM

## 2022-04-15 IMAGING — CT CT HEAD W/O CM
3 series · 15 of 47 positions shown, 18 images · non-contrast
Comparison: CT head 10/02/2011.

CLINICAL DATA: Two syncopal episodes with loss of consciousness
today. Unsure of head injury. Hypoxemia.

EXAM:
CT HEAD WITHOUT CONTRAST
CT CERVICAL SPINE WITHOUT CONTRAST
TECHNIQUE: Multidetector CT imaging of the head and cervical spine was
performed following the standard protocol without intravenous
contrast. Multiplanar CT image reconstructions of the cervical spine
were also generated.

[Series 2: head w o · axial · 0.44mm/px · z∈[+295,+440]mm · 9 of 35 slices shown, 12 images]
[im 3/35  brain]
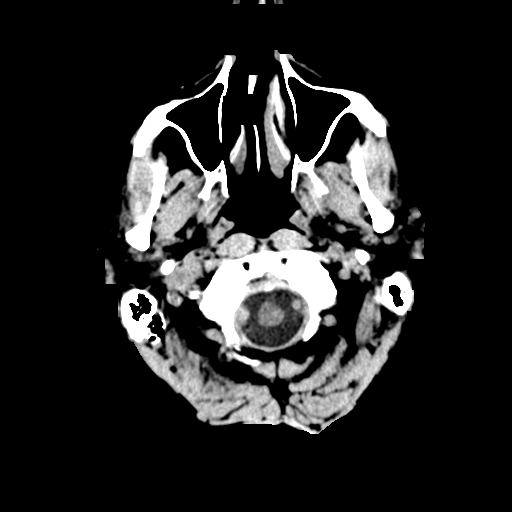
[im 3/35  bone]
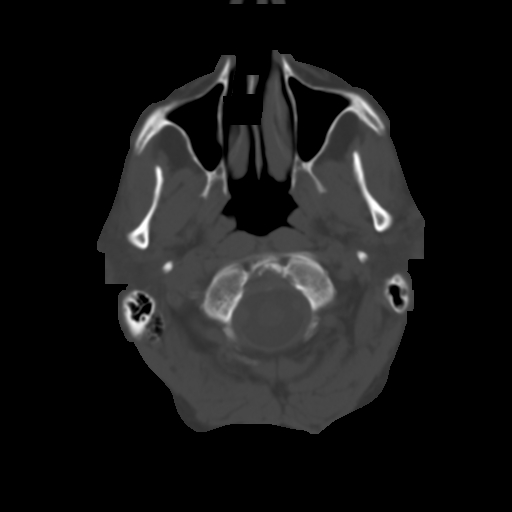
[im 6/35  brain]
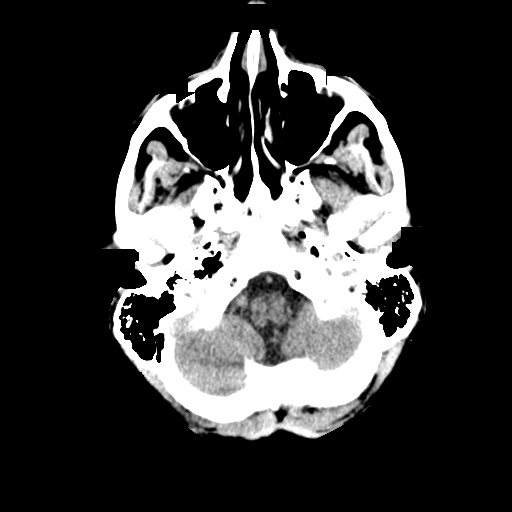
[im 10/35  brain]
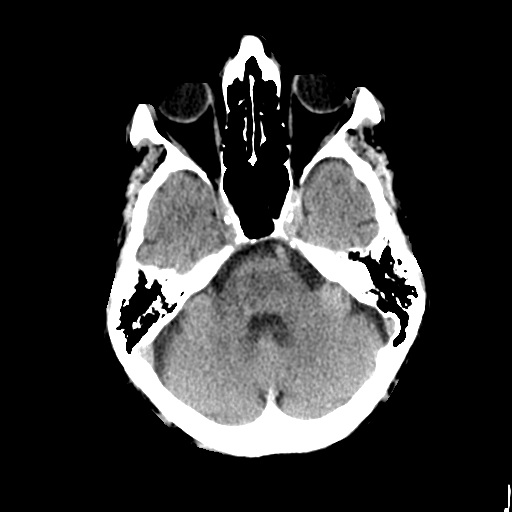
[im 13/35  brain]
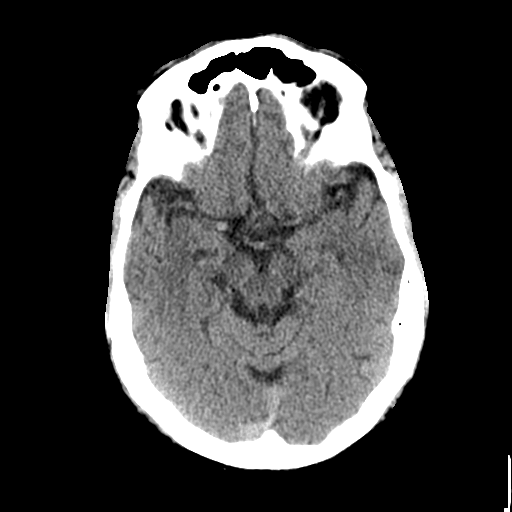
[im 18/35  brain]
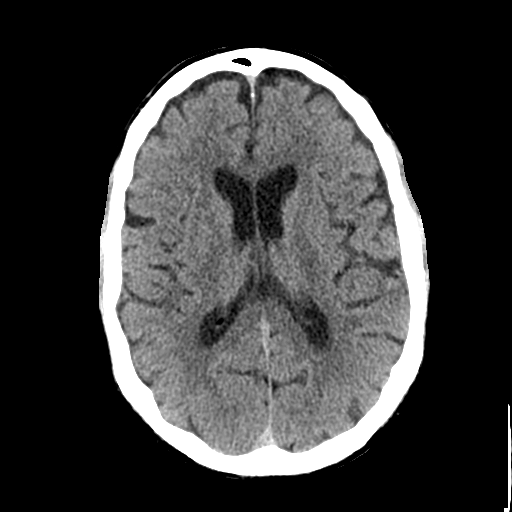
[im 18/35  bone]
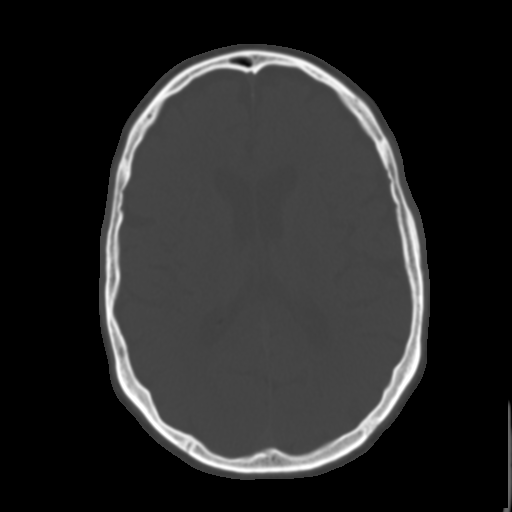
[im 22/35  brain]
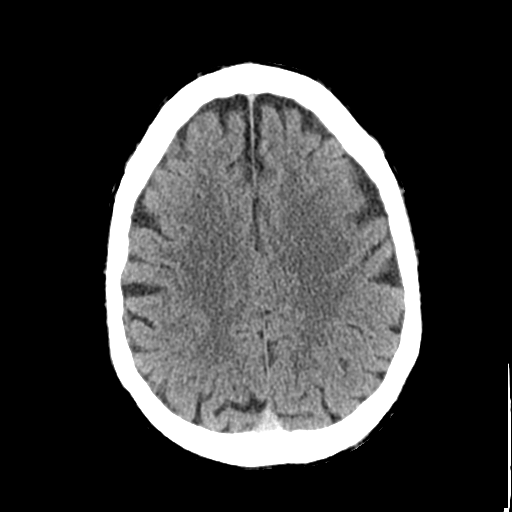
[im 25/35  brain]
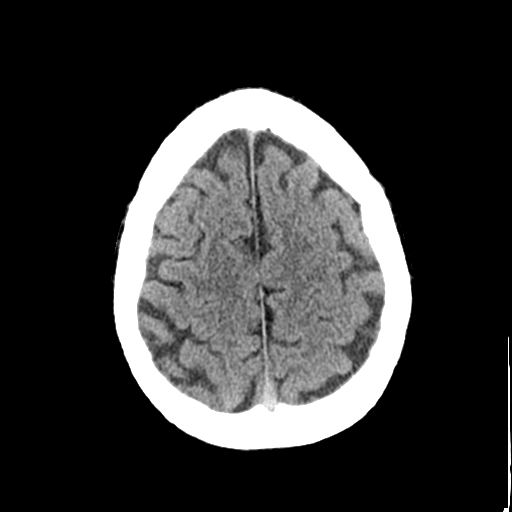
[im 29/35  brain]
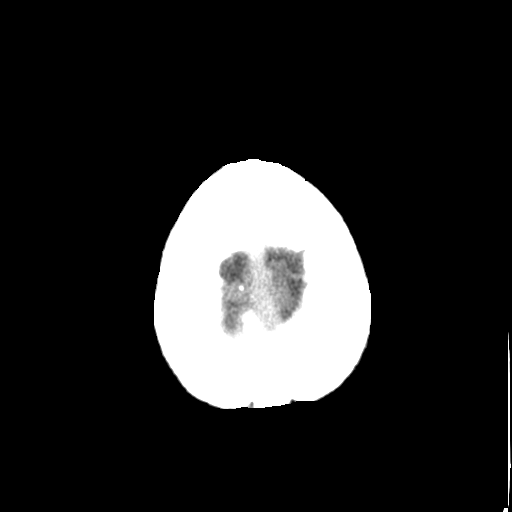
[im 32/35  brain]
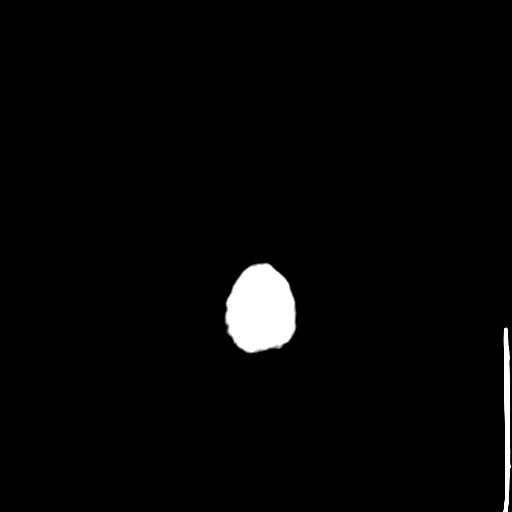
[im 32/35  bone]
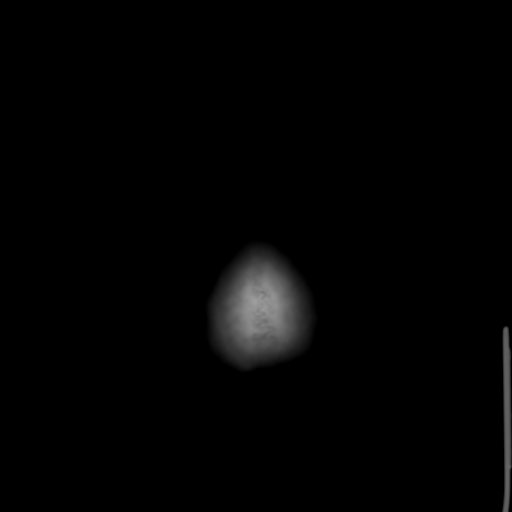

[Series 4: coronal soft · coronal · 0.33mm/px · 3 of 83 slices shown]
[im 28/83  brain]
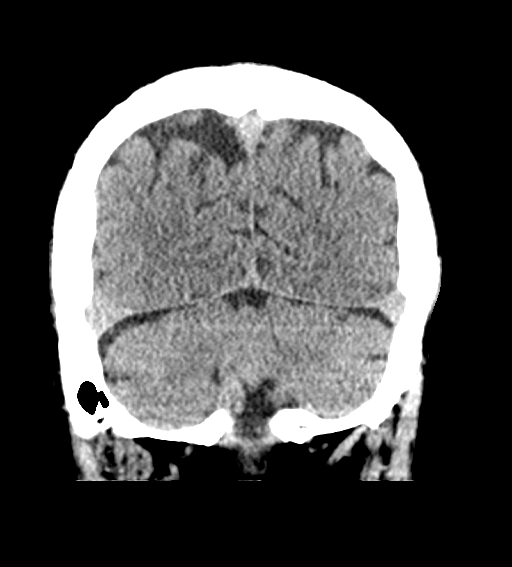
[im 37/83  brain]
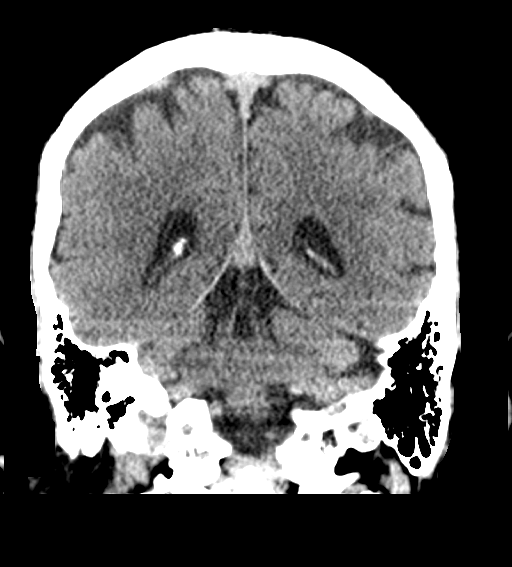
[im 46/83  brain]
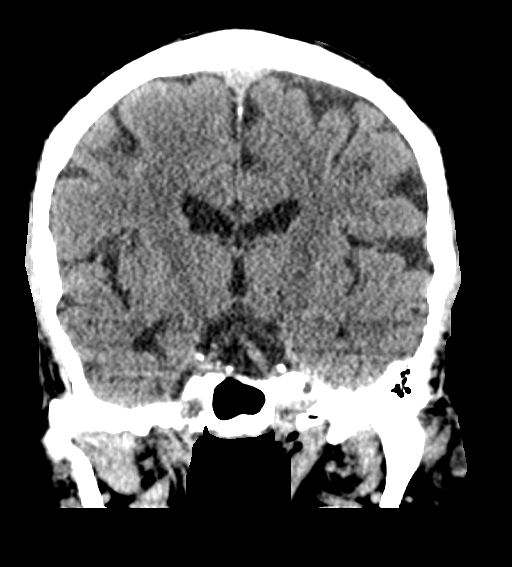

[Series 5: sagittal soft · sagittal · 0.35mm/px · 3 of 59 slices shown]
[im 20/59  brain]
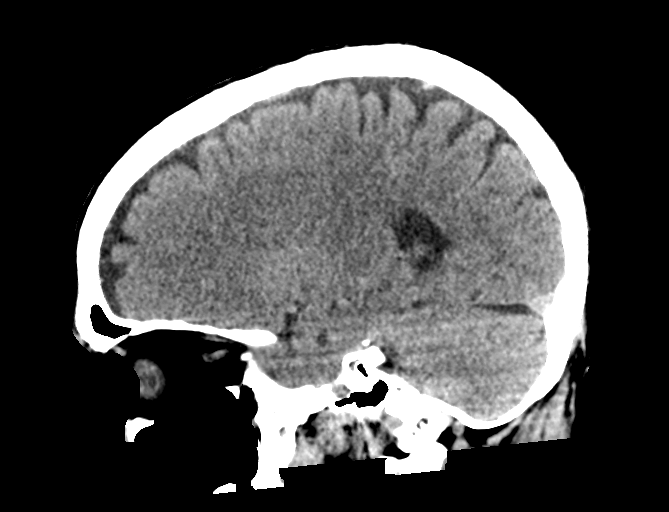
[im 30/59  brain]
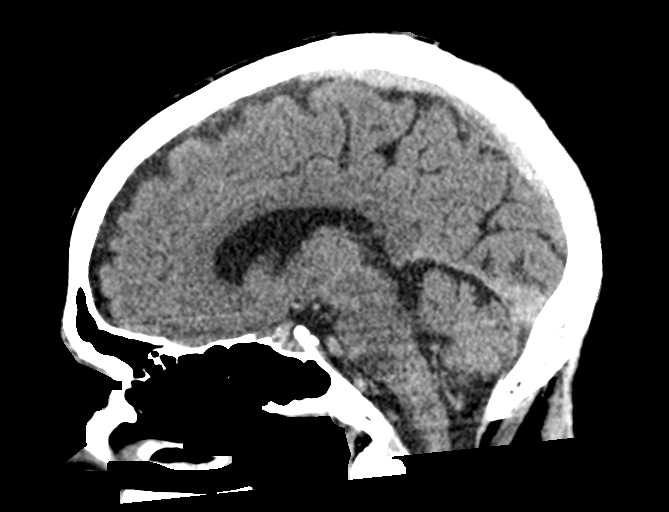
[im 39/59  brain]
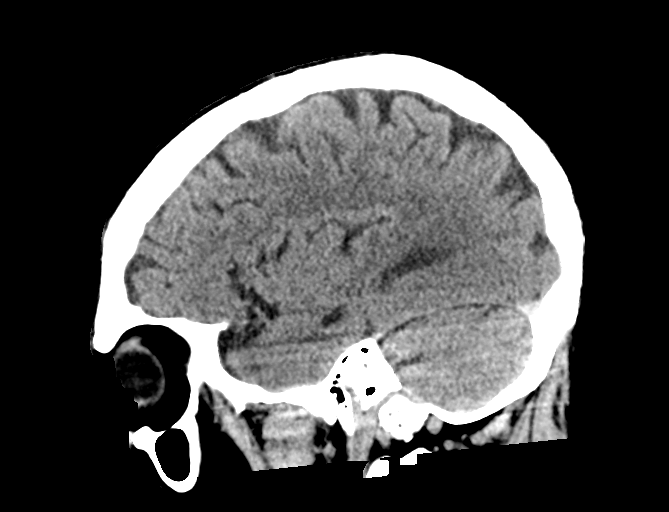

[15 of 47 positions shown; findings below may reference images not displayed]

FINDINGS: CT HEAD FINDINGS

Brain: There is no evidence of acute intracranial hemorrhage, mass
lesion, brain edema or extra-axial fluid collection. Mild atrophy
with mild prominence of the ventricles and subarachnoid spaces.
There is no CT evidence of acute cortical infarction.

Vascular: Intracranial vascular calcifications. No hyperdense vessel
identified.

Skull: Negative for fracture or focal lesion.

Sinuses/Orbits: The visualized paranasal sinuses and mastoid air
cells are clear. No orbital abnormalities are seen.

Other: None.

CT CERVICAL SPINE FINDINGS

Alignment: Normal.

Skull base and vertebrae: No evidence of acute fracture or traumatic
subluxation.

Soft tissues and spinal canal: No prevertebral fluid or swelling. No
visible canal hematoma.

Disc levels: Relatively mild multilevel spondylosis with disc
bulging and uncinate spurring. There is asymmetric facet hypertrophy
on the left at C4-5, contributing to mild left foraminal narrowing.

Upper chest: Unremarkable.

Other: Mild left carotid atherosclerosis.
IMPRESSION: 1. No acute intracranial or calvarial findings.
2. No evidence of acute cervical spine fracture, traumatic
subluxation or static signs of instability.
3. Mild cervical spondylosis.

## 2022-04-15 IMAGING — CT CT CERVICAL SPINE W/O CM
3 of 4 series · 12 of 33 positions shown, 14 images · non-contrast
Comparison: CT head 10/02/2011.

CLINICAL DATA: Two syncopal episodes with loss of consciousness
today. Unsure of head injury. Hypoxemia.

EXAM:
CT HEAD WITHOUT CONTRAST
CT CERVICAL SPINE WITHOUT CONTRAST
TECHNIQUE: Multidetector CT imaging of the head and cervical spine was
performed following the standard protocol without intravenous
contrast. Multiplanar CT image reconstructions of the cervical spine
were also generated.

[Series 6: sagittal bone · sagittal · 0.27mm/px · 5 of 61 slices shown, 6 images]
[im 21/61  bone]
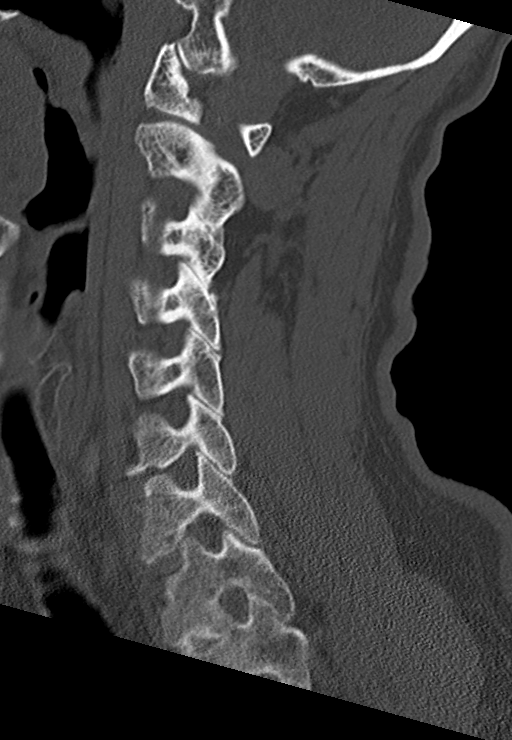
[im 26/61  bone]
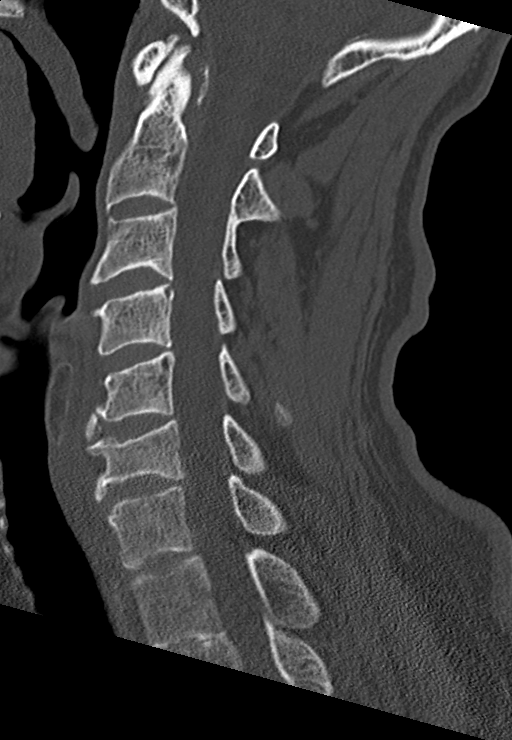
[im 31/61  soft-tissue]
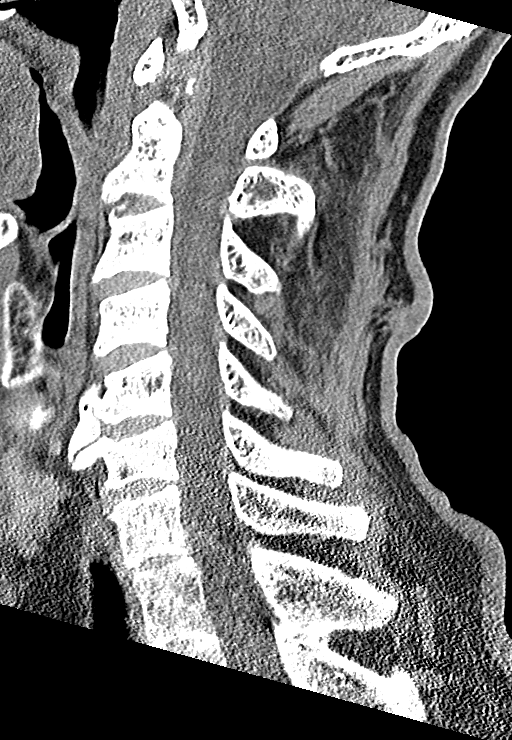
[im 31/61  bone]
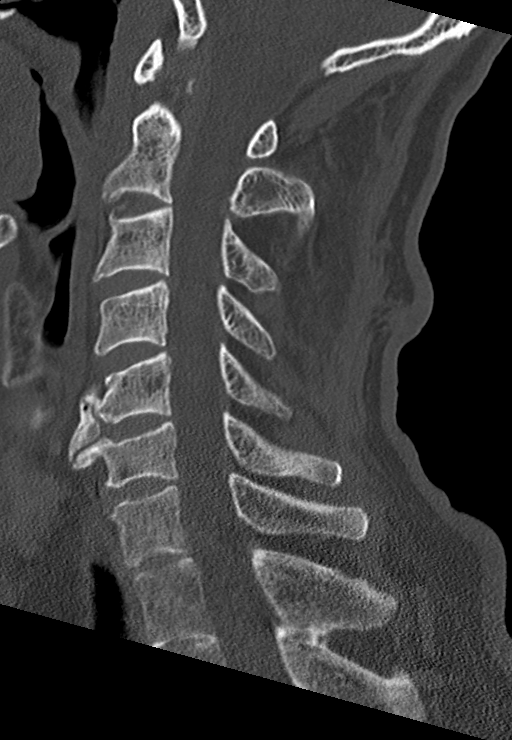
[im 36/61  bone]
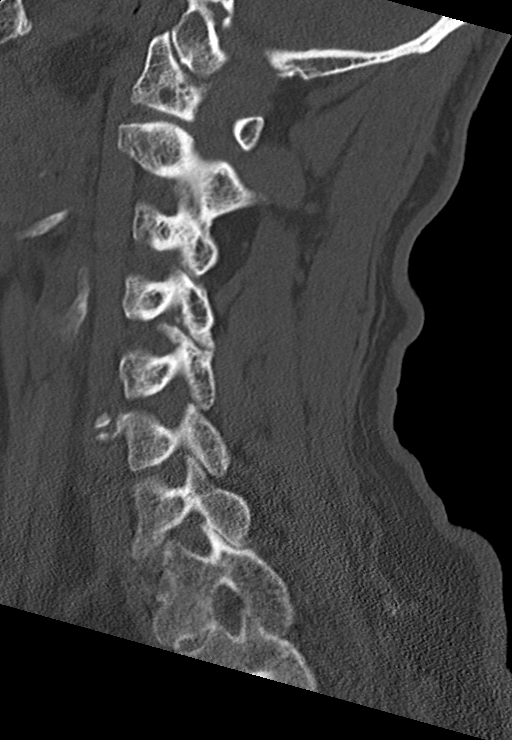
[im 41/61  bone]
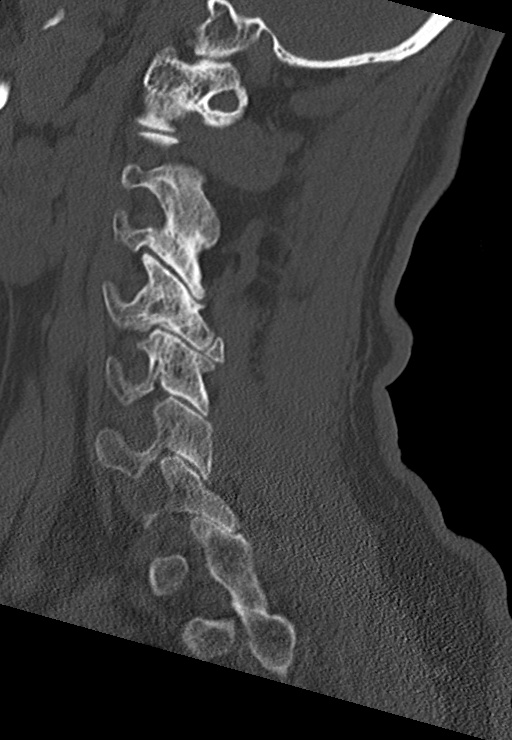

[Series 7: coronal bone · coronal · 0.25mm/px · 3 of 61 slices shown]
[im 13/61  bone]
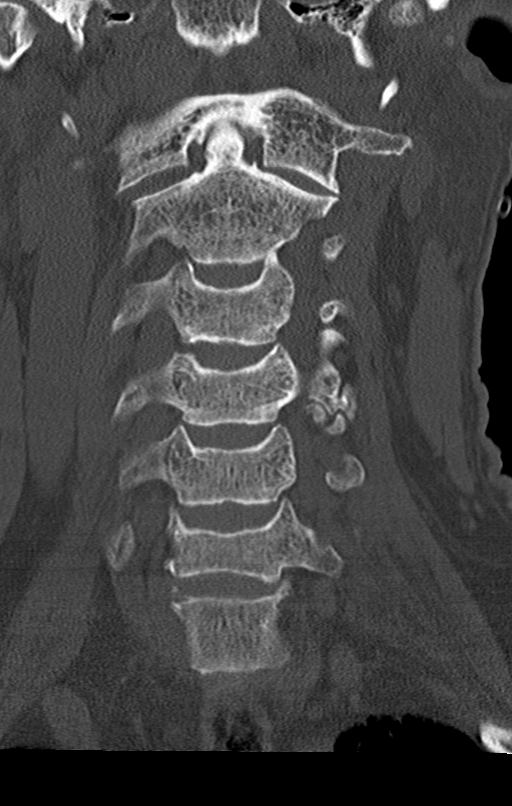
[im 25/61  bone]
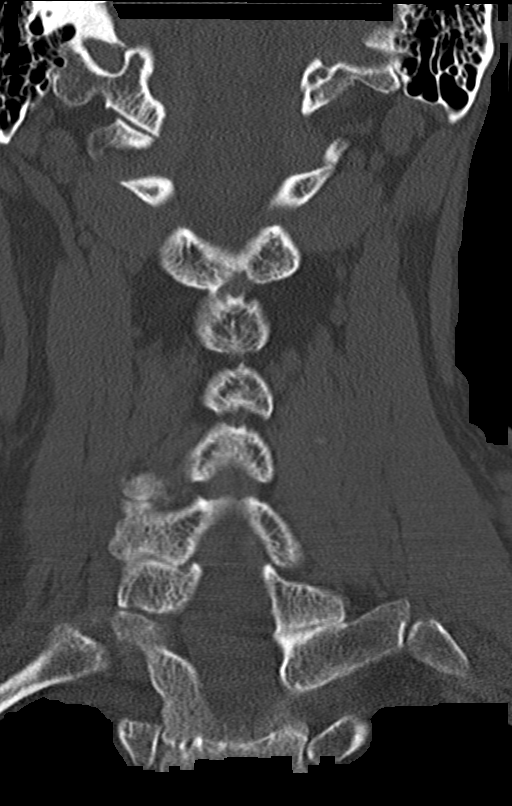
[im 37/61  bone]
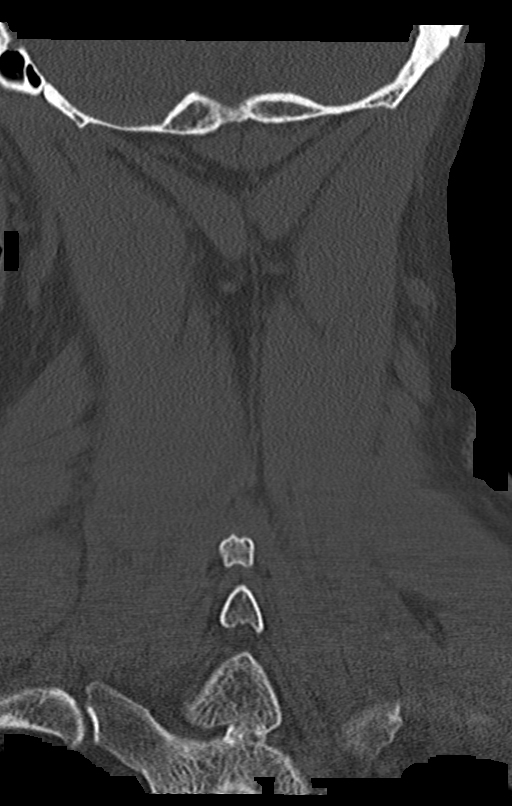

[Series 8: orthogonal axials · axial · 0.21mm/px · z∈[+136,+257]mm · 4 of 92 slices shown, 5 images]
[im 16/92  soft-tissue]
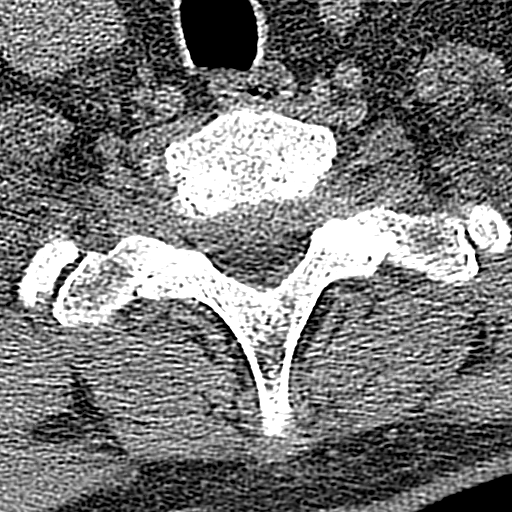
[im 16/92  bone]
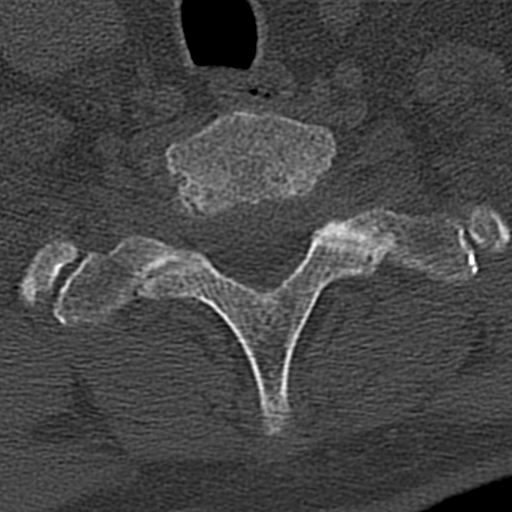
[im 31/92  bone]
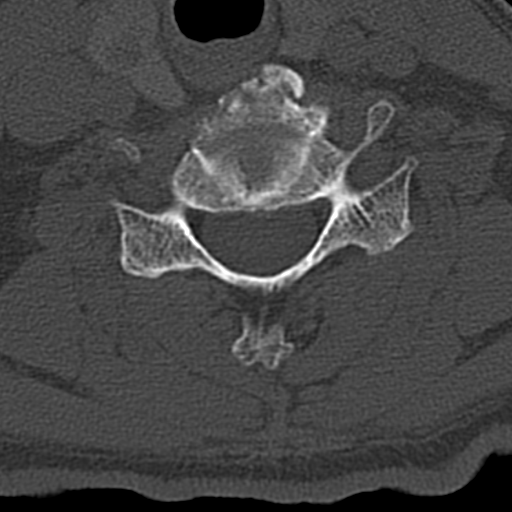
[im 61/92  bone]
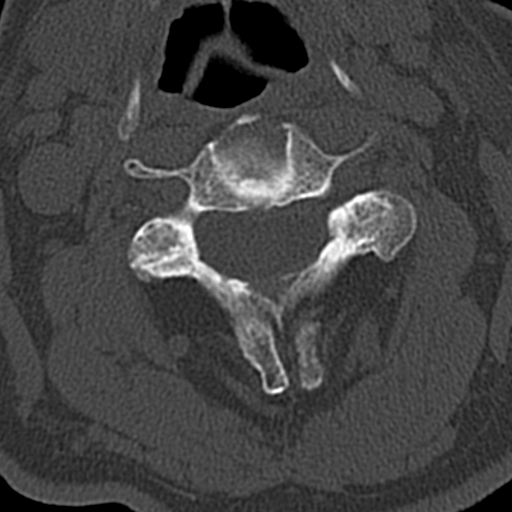
[im 76/92  bone]
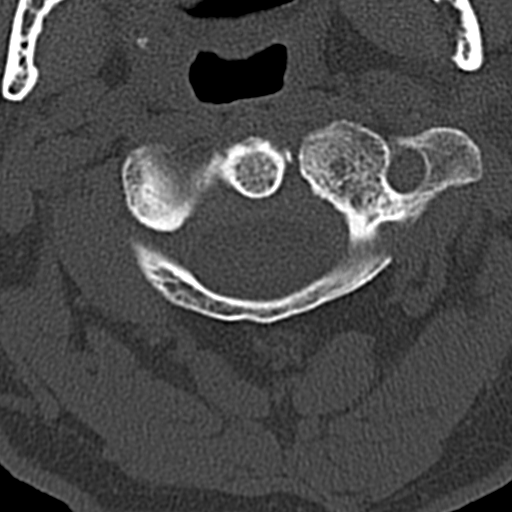

[12 of 33 positions shown; findings below may reference images not displayed]

FINDINGS: CT HEAD FINDINGS

Brain: There is no evidence of acute intracranial hemorrhage, mass
lesion, brain edema or extra-axial fluid collection. Mild atrophy
with mild prominence of the ventricles and subarachnoid spaces.
There is no CT evidence of acute cortical infarction.

Vascular: Intracranial vascular calcifications. No hyperdense vessel
identified.

Skull: Negative for fracture or focal lesion.

Sinuses/Orbits: The visualized paranasal sinuses and mastoid air
cells are clear. No orbital abnormalities are seen.

Other: None.

CT CERVICAL SPINE FINDINGS

Alignment: Normal.

Skull base and vertebrae: No evidence of acute fracture or traumatic
subluxation.

Soft tissues and spinal canal: No prevertebral fluid or swelling. No
visible canal hematoma.

Disc levels: Relatively mild multilevel spondylosis with disc
bulging and uncinate spurring. There is asymmetric facet hypertrophy
on the left at C4-5, contributing to mild left foraminal narrowing.

Upper chest: Unremarkable.

Other: Mild left carotid atherosclerosis.
IMPRESSION: 1. No acute intracranial or calvarial findings.
2. No evidence of acute cervical spine fracture, traumatic
subluxation or static signs of instability.
3. Mild cervical spondylosis.

## 2022-04-16 ENCOUNTER — Emergency Department (HOSPITAL_COMMUNITY): Payer: PPO

## 2022-04-16 ENCOUNTER — Other Ambulatory Visit: Payer: Self-pay

## 2022-04-16 ENCOUNTER — Emergency Department (HOSPITAL_COMMUNITY)
Admission: EM | Admit: 2022-04-16 | Discharge: 2022-04-16 | Disposition: A | Discharge status: Home / Self Care | Payer: PPO | Attending: Emergency Medicine | Admitting: Emergency Medicine

## 2022-04-16 ENCOUNTER — Telehealth: Payer: Self-pay | Admitting: *Deleted

## 2022-04-16 ENCOUNTER — Encounter (HOSPITAL_COMMUNITY): Payer: Self-pay | Admitting: Emergency Medicine

## 2022-04-16 DIAGNOSIS — K529 Noninfective gastroenteritis and colitis, unspecified: Secondary | ICD-10-CM | POA: Insufficient documentation

## 2022-04-16 DIAGNOSIS — N281 Cyst of kidney, acquired: Secondary | ICD-10-CM | POA: Diagnosis not present

## 2022-04-16 DIAGNOSIS — Z7901 Long term (current) use of anticoagulants: Secondary | ICD-10-CM | POA: Diagnosis not present

## 2022-04-16 DIAGNOSIS — Z7984 Long term (current) use of oral hypoglycemic drugs: Secondary | ICD-10-CM | POA: Insufficient documentation

## 2022-04-16 DIAGNOSIS — Z6828 Body mass index (BMI) 28.0-28.9, adult: Secondary | ICD-10-CM | POA: Diagnosis not present

## 2022-04-16 DIAGNOSIS — E119 Type 2 diabetes mellitus without complications: Secondary | ICD-10-CM | POA: Insufficient documentation

## 2022-04-16 DIAGNOSIS — Z79891 Long term (current) use of opiate analgesic: Secondary | ICD-10-CM | POA: Insufficient documentation

## 2022-04-16 DIAGNOSIS — N200 Calculus of kidney: Secondary | ICD-10-CM | POA: Diagnosis not present

## 2022-04-16 DIAGNOSIS — E1165 Type 2 diabetes mellitus with hyperglycemia: Secondary | ICD-10-CM | POA: Diagnosis not present

## 2022-04-16 DIAGNOSIS — R197 Diarrhea, unspecified: Secondary | ICD-10-CM | POA: Diagnosis not present

## 2022-04-16 DIAGNOSIS — R188 Other ascites: Secondary | ICD-10-CM | POA: Diagnosis not present

## 2022-04-16 DIAGNOSIS — Z79899 Other long term (current) drug therapy: Secondary | ICD-10-CM | POA: Diagnosis not present

## 2022-04-16 DIAGNOSIS — R1084 Generalized abdominal pain: Secondary | ICD-10-CM | POA: Diagnosis not present

## 2022-04-16 DIAGNOSIS — I10 Essential (primary) hypertension: Secondary | ICD-10-CM | POA: Insufficient documentation

## 2022-04-16 DIAGNOSIS — Z9049 Acquired absence of other specified parts of digestive tract: Secondary | ICD-10-CM | POA: Diagnosis not present

## 2022-04-16 DIAGNOSIS — R1031 Right lower quadrant pain: Secondary | ICD-10-CM | POA: Diagnosis present

## 2022-04-16 DIAGNOSIS — B182 Chronic viral hepatitis C: Secondary | ICD-10-CM | POA: Diagnosis not present

## 2022-04-16 DIAGNOSIS — E663 Overweight: Secondary | ICD-10-CM | POA: Diagnosis not present

## 2022-04-16 LAB — CBC
HCT: 43.7 % (ref 39.0–52.0)
Hemoglobin: 15.3 g/dL (ref 13.0–17.0)
MCH: 31.4 pg (ref 26.0–34.0)
MCHC: 35 g/dL (ref 30.0–36.0)
MCV: 89.7 fL (ref 80.0–100.0)
Platelets: 112 10*3/uL — ABNORMAL LOW (ref 150–400)
RBC: 4.87 MIL/uL (ref 4.22–5.81)
RDW: 13 % (ref 11.5–15.5)
WBC: 8.8 10*3/uL (ref 4.0–10.5)
nRBC: 0 % (ref 0.0–0.2)

## 2022-04-16 LAB — COMPREHENSIVE METABOLIC PANEL
ALT: 22 U/L (ref 0–44)
AST: 21 U/L (ref 15–41)
Albumin: 3.8 g/dL (ref 3.5–5.0)
Alkaline Phosphatase: 30 U/L — ABNORMAL LOW (ref 38–126)
Anion gap: 11 (ref 5–15)
BUN: 12 mg/dL (ref 8–23)
CO2: 21 mmol/L — ABNORMAL LOW (ref 22–32)
Calcium: 9 mg/dL (ref 8.9–10.3)
Chloride: 109 mmol/L (ref 98–111)
Creatinine, Ser: 1.04 mg/dL (ref 0.61–1.24)
GFR, Estimated: >60 mL/min (ref >60)
Glucose, Bld: 172 mg/dL — ABNORMAL HIGH (ref 70–99)
Potassium: 3.6 mmol/L (ref 3.5–5.1)
Sodium: 141 mmol/L (ref 135–145)
Total Bilirubin: 0.9 mg/dL (ref 0.3–1.2)
Total Protein: 6.8 g/dL (ref 6.5–8.1)

## 2022-04-16 LAB — URINALYSIS, ROUTINE W REFLEX MICROSCOPIC
Bacteria, UA: NONE SEEN
Bilirubin Urine: NEGATIVE
Glucose, UA: >=500 mg/dL — AB
Ketones, ur: NEGATIVE mg/dL
Leukocytes,Ua: NEGATIVE
Nitrite: NEGATIVE
Protein, ur: NEGATIVE mg/dL
Specific Gravity, Urine: 1.027 (ref 1.005–1.030)
pH: 5 (ref 5.0–8.0)

## 2022-04-16 LAB — LIPASE, BLOOD: Lipase: 33 U/L (ref 11–51)

## 2022-04-16 MED ORDER — IOHEXOL 300 MG/ML  SOLN
100.0000 mL | Freq: Once | INTRAMUSCULAR | Status: AC | PRN
Start: 2022-04-16 — End: 2022-04-16
  Administered 2022-04-16: 100 mL via INTRAVENOUS

## 2022-04-16 MED ORDER — DICYCLOMINE HCL 20 MG PO TABS
20.0000 mg | ORAL_TABLET | Freq: Two times a day (BID) | ORAL | 0 refills | Status: DC
Start: 2022-04-16 — End: 2022-05-14

## 2022-04-16 MED ORDER — SIMETHICONE 80 MG PO CHEW: 80.0000 mg | CHEWABLE_TABLET | Freq: Four times a day (QID) | ORAL | 0 refills | Status: AC | PRN

## 2022-04-16 NOTE — Discharge Instructions (Addendum)
We evaluated you for your abdominal distention.  Your CT scan did not show any fluid which needs to be drained.  Your symptoms are likely due to a virus.  There were no signs of dangerous infections in your abdomen on the CT scan.  Follow-up with your gastroenterologist.  I have prescribed you medications for gas and abdominal pain which you can try for your symptoms at home.  Please return if you develop any new or worsening symptoms, bloody stool, fevers, vomiting, or any other concerning symptoms.

## 2022-04-16 NOTE — ED Provider Notes (Signed)
[facility] EMERGENCY DEPARTMENT Provider Note  CSN: [id number] Arrival date & time: 04/16/22 1607  Chief Complaint(s) Abdominal Pain  HPI CHOICE Stephen Stephenson is a 71 y.o. male with history of diabetes, GERD, NASH presenting with abdominal distention and pain.  He reports 2 days of abdominal pain, worse in the lower abdomen on the right side.  He also reports abdominal distention.  He reports nausea, currently resolved, no vomiting.  No melena, hematochezia.  He does report ongoing diarrhea, and increased gas.  No dysuria.  No fevers or chills.  Has had similar episodes in the past which resolved on their own.  He saw someone in the gastroenterology office who suggested he may need a paracentesis and advised him to come to the emergency department.  He did not see his regular gastroenterologist he was on vacation.   Past Medical History Past Medical History:  Diagnosis Date   DM (diabetes mellitus) ([place])    type II   GERD (gastroesophageal reflux disease)    Hb-AS genotype ([place])    Hepatitis C    genotype 1 eradicated   IBS (irritable bowel syndrome)    Insomnia    Kidney stone    NASH (nonalcoholic steatohepatitis)    Metavir F3, 10/2013, patient reports completing hepatitis a and B vaccination. 2016   Patient Active Problem List   Diagnosis Date Noted   Liver fibrosis 05/23/2021   Right shoulder pain 10/10/2020   Pulmonary emboli ([place]) 10/09/2020   Acute pulmonary embolism ([place]) 10/09/2020   Hepatic cirrhosis ([place]) 10/05/2020   History of colonic polyps    Diverticulosis of colon without hemorrhage    Hx of adenomatous colonic polyps 12/19/2014   NAFLD (nonalcoholic fatty liver disease) 08/09/2013   Hemorrhoid 08/09/2013   Diarrhea 08/09/2013   Abdominal pain 08/09/2013   IBS (irritable bowel syndrome) 10/08/2012   Kidney stone 03/14/2012   HTN (hypertension) 03/14/2012   DM (diabetes mellitus) ([place]) 03/14/2012   History of hepatitis C 03/14/2012   GERD (gastroesophageal  reflux disease) 05/21/2011   Home Medication(s) Prior to Admission medications   Medication Sig Start Date End Date Taking? Authorizing Provider  apixaban (ELIQUIS) 2.5 MG TABS tablet Take by mouth. 06/03/21  Yes [provider]  atorvastatin (LIPITOR) 10 MG tablet Take 10 mg by mouth daily. 12/20/21  Yes [provider]  dicyclomine (BENTYL) 20 MG tablet Take 1 tablet (20 mg total) by mouth 2 (two) times daily. 04/16/22  Yes Cristie Hem, MD  glimepiride (AMARYL) 4 MG tablet Take 4 mg by mouth 2 (two) times daily.   Yes [provider]  olmesartan (BENICAR) 40 MG tablet Take 20 mg by mouth daily. 08/23/20  Yes [provider]  pantoprazole (PROTONIX) 40 MG tablet Take 1 tablet by mouth once daily 03/26/22  Yes [name], NP  potassium citrate (UROCIT-K) 10 MEQ (1080 MG) SR tablet Take 10 mEq by mouth 2 (two) times daily.    Yes [provider]  simethicone (GAS-X) 80 MG chewable tablet Chew 1 tablet (80 mg total) by mouth every 6 (six) hours as needed for flatulence. 04/16/22  Yes Cristie Hem, MD  triamcinolone (KENALOG) 0.1 % Apply 1 application topically 2 (two) times daily. APPLY CREAM EXTERNALLY TWICE DAILY FOR UP TO 2 CONTINUOUS WEEKS THEN TAKE A 2 WEEK BREAK BEFORE RESTARTING. 08/28/20  Yes [provider]  TRIJARDY XR 5-2.11-998 MG TB24 Take 1 tablet by mouth 2 (two) times daily. 03/25/22  Yes [provider]  Vitamin D, Ergocalciferol, (DRISDOL) 1.25 MG (50000 UNIT) CAPS capsule Take by mouth. 03/27/22  Yes [provider]                                                                                                                                    Past Surgical History Past Surgical History:  Procedure Laterality Date   BACK SURGERY     CHOLECYSTECTOMY     COLONOSCOPY  02/05/2002   anal canal herrhoids ow normal/normal ileum/suspect pt bled from hemorrhoids presently   COLONOSCOPY  09/27/2011    YPP:[id number] hemorrhoids. Hepatic flexure polyp-removed TUBULAR ADENOMA, due for surveillance March 2016   COLONOSCOPY N/A 01/20/2015   IWP:YKDXIP colonic polyp removed/colonic diverticulosis   COLONOSCOPY N/A 07/05/2020   single 3 mm polyp at the rectosigmoid colon and a 7 mm innocent appearing AVM at the base of the cecum, otherwise normal.  Surgical pathology found the polyp to be hyperplastic.  Recommended no future colonoscopy unless new symptoms develop.   CYSTOSCOPY W/ URETERAL STENT PLACEMENT Left 02/03/2013   Procedure: CYSTOSCOPY WITH RETROGRADE PYELOGRAM/URETERAL STENT PLACEMENT;  Surgeon: Marissa Nestle, MD;  Location: [facility];  Service: Urology;  Laterality: Left;   ESOPHAGOGASTRODUODENOSCOPY  05/17/2005   normal esophagus/gastric mucosa appeared normal   ESOPHAGOGASTRODUODENOSCOPY N/A 04/25/2014   Abnormal gastric mucosa, benign biopsies. No esophageal varices. Next EGD October 2018.   ESOPHAGOGASTRODUODENOSCOPY (EGD) WITH PROPOFOL N/A 01/08/2018   nonerosive reflux esophagitis status post Maloney dilation, portal hypertensive gastropathy, normal duodenum. Surveillance was due 2021   EXTRACORPOREAL SHOCK WAVE LITHOTRIPSY Left 02/03/2013   Procedure: EXTRACORPOREAL SHOCK WAVE LITHOTRIPSY (ESWL) LEFT RENAL CALCULUS;  Surgeon: Marissa Nestle, MD;  Location: [facility];  Service: Urology;  Laterality: Left;   KIDNEY STONE SURGERY     MALONEY DILATION N/A 01/08/2018   Procedure: Venia Minks DILATION;  Surgeon: [name], MD;  Location: [facility];  Service: Endoscopy;  Laterality: N/A;   POLYPECTOMY  07/05/2020   Procedure: POLYPECTOMY;  Surgeon: [name], MD;  Location: [facility];  Service: Endoscopy;;  recto-sigmoid    SHOULDER SURGERY     Family History Family History  Problem Relation Age of Onset   Liver disease Father        ? Hepatitis C; Alcoholic   Colon cancer Neg Hx     Social History Social History   Tobacco Use   Smoking status: Former     Packs/day: 1.00    Years: 25.00    Total pack years: 25.00    Types: Cigarettes    Quit date: 08/02/1995    Years since quitting: 26.7   Smokeless tobacco: Former    Quit date: 10/12/1992  Vaping Use   Vaping Use: Never used  Substance Use Topics   Alcohol use: Not Currently    Alcohol/week: 0.0 standard drinks of alcohol    Comment: Occasionally; Average one drink every few months; Summer  will occasionally drink a bottle of wine.   Drug use: No   Allergies Patient has no known allergies.  Review of Systems Review of Systems  All other systems reviewed and are negative.   Physical Exam Vital Signs  I have reviewed the triage vital signs BP 138/75   Pulse (!) 101   Temp 98.1 F (36.7 C) (Oral)   Resp 20   Ht 6' (1.829 m)   Wt 96.6 kg   SpO2 94%   BMI 28.89 kg/m  Physical Exam Vitals and nursing note reviewed.  Constitutional:      General: He is not in acute distress.    Appearance: Normal appearance.  HENT:     Mouth/Throat:     Mouth: Mucous membranes are moist.  Eyes:     Conjunctiva/sclera: Conjunctivae normal.  Cardiovascular:     Rate and Rhythm: Normal rate and regular rhythm.  Pulmonary:     Effort: Pulmonary effort is normal. No respiratory distress.     Breath sounds: Normal breath sounds.  Abdominal:     General: Abdomen is flat and protuberant. There is distension.     Palpations: Abdomen is soft. There is no shifting dullness or fluid wave.     Tenderness: There is abdominal tenderness in the right lower quadrant.  Musculoskeletal:     Right lower leg: No edema.     Left lower leg: No edema.  Skin:    General: Skin is warm and dry.     Capillary Refill: Capillary refill takes less than 2 seconds.  Neurological:     Mental Status: He is alert and oriented to person, place, and time. Mental status is at baseline.  Psychiatric:        Mood and Affect: Mood normal.        Behavior: Behavior normal.     ED Results and Treatments Labs (all  labs ordered are listed, but only abnormal results are displayed) Labs Reviewed  COMPREHENSIVE METABOLIC PANEL - Abnormal; Notable for the following components:      Result Value   CO2 21 (*)    Glucose, Bld 172 (*)    Alkaline Phosphatase 30 (*)    All other components within normal limits  CBC - Abnormal; Notable for the following components:   Platelets 112 (*)    All other components within normal limits  URINALYSIS, ROUTINE W REFLEX MICROSCOPIC - Abnormal; Notable for the following components:   Glucose, UA >=500 (*)    Hgb urine dipstick MODERATE (*)    All other components within normal limits  LIPASE, BLOOD                                                                                                                          Radiology CT Abdomen Pelvis W Contrast  Result Date: 04/16/2022 CLINICAL DATA:  Abdominal pain, diarrhea, liver issues EXAM: CT ABDOMEN AND PELVIS WITH CONTRAST TECHNIQUE: Multidetector CT imaging of the abdomen and pelvis  was performed using the standard protocol following bolus administration of intravenous contrast. RADIATION DOSE REDUCTION: This exam was performed according to the departmental dose-optimization program which includes automated exposure control, adjustment of the mA and/or kV according to patient size and/or use of iterative reconstruction technique. CONTRAST:  162m OMNIPAQUE IOHEXOL 300 MG/ML  SOLN COMPARISON:  05/19/2019 FINDINGS: Lower chest: Mild bibasilar scarring/atelectasis. Hepatobiliary: Scattered hepatic cysts measuring up to 2.3 cm in the central right liver (series 2/image 27), benign. Status post cholecystectomy. No intrahepatic or extrahepatic duct dilatation. Pancreas: Within normal limits. Spleen: 17 mm cyst in the anterior spleen (series 2/image 21), benign. Adrenals/Urinary Tract: Adrenal glands are within normal limits. 17 mm nonobstructing left lower pole renal calculus (series 2/image 47). Subcentimeter interpolar left renal  cyst (series 2/image 41), measuring simple fluid density, benign (Bosniak I). Punctate nonobstructing right upper pole renal calculus (series 2/image 34). No hydronephrosis. Bladder is within normal limits. Stomach/Bowel: Stomach is within normal limits. No evidence of bowel obstruction. Normal appendix (series 2/image 67). No colonic wall thickening or inflammatory changes. Vascular/Lymphatic: No evidence of abdominal aortic aneurysm. Atherosclerotic calcifications of the abdominal aorta and branch vessels. No suspicious abdominopelvic lymphadenopathy. Reproductive: Prostatomegaly, suggesting BPH. Other: No abdominopelvic ascites. Musculoskeletal: Degenerative changes of the lumbar spine. IMPRESSION: No CT findings to account for the patient's abdominal pain. 17 mm nonobstructing left lower pole renal calculus. Punctate nonobstructing right upper pole renal calculus. No hydronephrosis. Prostatomegaly, suggesting BPH. Electronically Signed   By: SJulian HyM.D.   On: 04/16/2022 19:10    Pertinent labs & imaging results that were available during my care of the patient were reviewed by me and considered in my medical decision making (see MDM for details).  Medications Ordered in ED Medications  iohexol (OMNIPAQUE) 300 MG/ML solution 100 mL (100 mLs Intravenous Contrast Given 04/16/22 1901)                                                                                                                                     Procedures Procedures  (including critical care time)  Medical Decision Making / ED Course   MDM:  71year old male presenting to the emergency department for abdominal pain.  Patient well-appearing, physical exam with some distention, but no obvious fluid wave.  Also with some right lower quadrant tenderness.  Performed bedside ultrasound without obvious ascites.  Possibly trace but certainly not enough for paracentesis.  Will obtain lab testing to evaluate for other  intra-abdominal causes such as obstruction, perforation, appendicitis, enteritis, colitis.  Patient overall well-appearing.  Will reassess.  Declines pain or nausea medicine at this time.  Clinical Course as of 04/16/22 2038  Tue Apr 16, 2022  2038 CT scan negative for any ascites which needs to be drained.  No sign of acute intra-abdominal pathology.  Suspect symptoms most likely due to enteritis.  Will prescribe Bentyl and simethicone for the patient's symptoms.  Advise follow-up  with his gastroenterologist for further management.  Labs not suspicious for urinary infection. Will discharge patient to home. All questions answered. Patient comfortable with plan of discharge. Return precautions discussed with patient and specified on the after visit summary.  [WS]    Clinical Course User Index [WS] Cristie Hem, MD     Additional history obtained: -External records from outside source obtained and reviewed including: Chart review including previous notes, labs, imaging, consultation notes   Lab Tests: -I ordered, reviewed, and interpreted labs.   The pertinent results include:   Labs Reviewed  COMPREHENSIVE METABOLIC PANEL - Abnormal; Notable for the following components:      Result Value   CO2 21 (*)    Glucose, Bld 172 (*)    Alkaline Phosphatase 30 (*)    All other components within normal limits  CBC - Abnormal; Notable for the following components:   Platelets 112 (*)    All other components within normal limits  URINALYSIS, ROUTINE W REFLEX MICROSCOPIC - Abnormal; Notable for the following components:   Glucose, UA >=500 (*)    Hgb urine dipstick MODERATE (*)    All other components within normal limits  LIPASE, BLOOD        Imaging Studies ordered: I ordered imaging studies including CT A/P On my interpretation imaging demonstrates no acute process I independently visualized and interpreted imaging. I agree with the radiologist interpretation   Medicines  ordered and prescription drug management: Meds ordered this encounter  Medications   iohexol (OMNIPAQUE) 300 MG/ML solution 100 mL   simethicone (GAS-X) 80 MG chewable tablet    Sig: Chew 1 tablet (80 mg total) by mouth every 6 (six) hours as needed for flatulence.    Dispense:  30 tablet    Refill:  0   dicyclomine (BENTYL) 20 MG tablet    Sig: Take 1 tablet (20 mg total) by mouth 2 (two) times daily.    Dispense:  20 tablet    Refill:  0    -I have reviewed the patients home medicines and have made adjustments as needed    Social Determinants of Health:  Factors impacting patients care include: former smoker   Reevaluation: After the interventions noted above, I reevaluated the patient and found that they have improved  Co morbidities that complicate the patient evaluation  Past Medical History:  Diagnosis Date   DM (diabetes mellitus) ([place])    type II   GERD (gastroesophageal reflux disease)    Hb-AS genotype ([place])    Hepatitis C    genotype 1 eradicated   IBS (irritable bowel syndrome)    Insomnia    Kidney stone    NASH (nonalcoholic steatohepatitis)    Metavir F3, 10/2013, patient reports completing hepatitis a and B vaccination. 2016      Dispostion: Discharge    Final Clinical Impression(s) / ED Diagnoses Final diagnoses:  Generalized abdominal pain  Enteritis     This chart was dictated using voice recognition software.  Despite best efforts to proofread,  errors can occur which can change the documentation meaning.    Cristie Hem, MD 04/16/22 2039

## 2022-04-16 NOTE — Telephone Encounter (Signed)
As he is in pain and abdomen is swollen, recommend ED evaluation.

## 2022-04-16 NOTE — ED Triage Notes (Signed)
Pt presents with abdominal pain with diarrhea, pt has PMH of liver disease, followed by Dr. Buford Dresser, may need stomach tapped.

## 2022-04-16 NOTE — Telephone Encounter (Signed)
Pt came in office, stating that [facility] sent him here for a paracentesis. He states he was told that fluid was coming from his liver. Pt states he is in pain and stomach is swollen. The office manager advised him to go the ED, because we would have to have an order and  the paracentesis would have to be scheduled.

## 2022-04-17 NOTE — Telephone Encounter (Signed)
Let's set him up for a visit in office next week. Thanks!

## 2022-04-17 NOTE — Telephone Encounter (Signed)
Noted  

## 2022-04-22 NOTE — Telephone Encounter (Signed)
OV made °

## 2022-04-23 ENCOUNTER — Ambulatory Visit: Payer: PPO | Admitting: Gastroenterology

## 2022-04-23 DIAGNOSIS — Z1331 Encounter for screening for depression: Secondary | ICD-10-CM | POA: Diagnosis not present

## 2022-04-23 DIAGNOSIS — E559 Vitamin D deficiency, unspecified: Secondary | ICD-10-CM | POA: Diagnosis not present

## 2022-04-23 DIAGNOSIS — E1140 Type 2 diabetes mellitus with diabetic neuropathy, unspecified: Secondary | ICD-10-CM | POA: Diagnosis not present

## 2022-04-23 DIAGNOSIS — E782 Mixed hyperlipidemia: Secondary | ICD-10-CM | POA: Diagnosis not present

## 2022-04-23 DIAGNOSIS — D696 Thrombocytopenia, unspecified: Secondary | ICD-10-CM | POA: Diagnosis not present

## 2022-04-23 DIAGNOSIS — Z0001 Encounter for general adult medical examination with abnormal findings: Secondary | ICD-10-CM | POA: Diagnosis not present

## 2022-04-23 DIAGNOSIS — E663 Overweight: Secondary | ICD-10-CM | POA: Diagnosis not present

## 2022-04-23 DIAGNOSIS — Z6828 Body mass index (BMI) 28.0-28.9, adult: Secondary | ICD-10-CM | POA: Diagnosis not present

## 2022-04-25 DIAGNOSIS — E1129 Type 2 diabetes mellitus with other diabetic kidney complication: Secondary | ICD-10-CM | POA: Diagnosis not present

## 2022-04-25 DIAGNOSIS — E1159 Type 2 diabetes mellitus with other circulatory complications: Secondary | ICD-10-CM | POA: Diagnosis not present

## 2022-04-25 DIAGNOSIS — R809 Proteinuria, unspecified: Secondary | ICD-10-CM | POA: Diagnosis not present

## 2022-04-25 DIAGNOSIS — E785 Hyperlipidemia, unspecified: Secondary | ICD-10-CM | POA: Diagnosis not present

## 2022-04-25 DIAGNOSIS — E1169 Type 2 diabetes mellitus with other specified complication: Secondary | ICD-10-CM | POA: Diagnosis not present

## 2022-04-25 DIAGNOSIS — I152 Hypertension secondary to endocrine disorders: Secondary | ICD-10-CM | POA: Diagnosis not present

## 2022-05-14 ENCOUNTER — Telehealth: Payer: Self-pay

## 2022-05-14 ENCOUNTER — Encounter: Payer: Self-pay | Admitting: Gastroenterology

## 2022-05-14 ENCOUNTER — Ambulatory Visit: Payer: PPO | Admitting: Gastroenterology

## 2022-05-14 VITALS — BP 124/73 | HR 72 | Temp 97.4°F | Ht 72.0 in | Wt 206.0 lb

## 2022-05-14 DIAGNOSIS — K582 Mixed irritable bowel syndrome: Secondary | ICD-10-CM

## 2022-05-14 MED ORDER — RIFAXIMIN 550 MG PO TABS
550.0000 mg | ORAL_TABLET | Freq: Three times a day (TID) | ORAL | 0 refills | Status: AC
Start: 2022-05-14 — End: 2022-05-28

## 2022-05-14 NOTE — Patient Instructions (Signed)
I have sent in Xifaxan to take three times a day for 14 days. Please let me know if this is too expensive.  We will see you in 6-8 weeks!  I enjoyed seeing you again today! As you know, I value our relationship and want to provide genuine, compassionate, and quality care. I welcome your feedback. If you receive a survey regarding your visit,  I greatly appreciate you taking time to fill this out. See you next time!  [name], PhD, ANP-BC [facility]

## 2022-05-14 NOTE — Progress Notes (Signed)
Gastroenterology Office Note     Primary Care Physician:  Sharilyn Sites, MD  Primary Gastroenterologist: Dr. Gala Romney    Chief Complaint   Chief Complaint  Patient presents with   Follow-up    Follow up from ED visit on 04/16/2022. Diarrhea, gas and abd pain     History of Present Illness   VIAAN KNIPPENBERG is a 71 y.o. male presenting today in follow-up with a history of Hep C s/p eradication and increased fibrosis in past (Metavir 3).  Elastography in April 2022 with kPa 4.8, improved. Portal gastropathy on EGD in 2019. We are performing serial ultrasounds due to advanced fibrosis. Due for surveillance now. Colonoscopy up-to-date as of 2021 and no further needed unless new symptoms develop.   He was seen in the ED on 04/16/2022 with abdominal pain. CT abd/pelvis with contrast Oct 2023 with 17 mm non-obstructing left lower pole renal calculus, non-obstructing right upper pole renal calculus. Prostatomegaly noted. CBC, CMP unrevealing other than chronic thrombocytopenia.   Have had these symptoms chronically for about a year or two. Episodes will last 1-2 days then will go on about business. Will have intermittent lower abdominal pain and RUQ abdominal pain, worse at night. Growling in abdomen. Associated gas. Will go to bathroom and mostly will be gas. Always has diarrhea with this. Will burp and have foul odor. Diarrhea will be like a river running or an episode and then done. Does not have chronically multiple loose stools a day. Alternating constipation and diarrhea.   Took dicyclomine without much improvement. He wants to hold off on EGD right now.    Past Medical History:  Diagnosis Date   DM (diabetes mellitus) ([place])    type II   GERD (gastroesophageal reflux disease)    Hb-AS genotype ([place])    Hepatitis C    genotype 1 eradicated   IBS (irritable bowel syndrome)    Insomnia    Kidney stone    NASH (nonalcoholic steatohepatitis)    Metavir F3, 10/2013, patient reports  completing hepatitis a and B vaccination. 2016    Past Surgical History:  Procedure Laterality Date   BACK SURGERY     CHOLECYSTECTOMY     COLONOSCOPY  02/05/2002   anal canal herrhoids ow normal/normal ileum/suspect pt bled from hemorrhoids presently   COLONOSCOPY  09/27/2011   XBD:[id number] hemorrhoids. Hepatic flexure polyp-removed TUBULAR ADENOMA, due for surveillance March 2016   COLONOSCOPY N/A 01/20/2015   AST:MHDQQI colonic polyp removed/colonic diverticulosis   COLONOSCOPY N/A 07/05/2020   single 3 mm polyp at the rectosigmoid colon and a 7 mm innocent appearing AVM at the base of the cecum, otherwise normal.  Surgical pathology found the polyp to be hyperplastic.  Recommended no future colonoscopy unless new symptoms develop.   CYSTOSCOPY W/ URETERAL STENT PLACEMENT Left 02/03/2013   Procedure: CYSTOSCOPY WITH RETROGRADE PYELOGRAM/URETERAL STENT PLACEMENT;  Surgeon: Marissa Nestle, MD;  Location: [facility];  Service: Urology;  Laterality: Left;   ESOPHAGOGASTRODUODENOSCOPY  05/17/2005   normal esophagus/gastric mucosa appeared normal   ESOPHAGOGASTRODUODENOSCOPY N/A 04/25/2014   Abnormal gastric mucosa, benign biopsies. No esophageal varices. Next EGD October 2018.   ESOPHAGOGASTRODUODENOSCOPY (EGD) WITH PROPOFOL N/A 01/08/2018   nonerosive reflux esophagitis status post Maloney dilation, portal hypertensive gastropathy, normal duodenum. Surveillance was due 2021   EXTRACORPOREAL SHOCK WAVE LITHOTRIPSY Left 02/03/2013   Procedure: EXTRACORPOREAL SHOCK WAVE LITHOTRIPSY (ESWL) LEFT RENAL CALCULUS;  Surgeon: Marissa Nestle, MD;  Location: [facility];  Service: Urology;  Laterality:  Left;   KIDNEY STONE SURGERY     MALONEY DILATION N/A 01/08/2018   Procedure: Venia Minks DILATION;  Surgeon: [name], MD;  Location: [facility];  Service: Endoscopy;  Laterality: N/A;   POLYPECTOMY  07/05/2020   Procedure: POLYPECTOMY;  Surgeon: [name], MD;  Location: [facility];   Service: Endoscopy;;  recto-sigmoid    SHOULDER SURGERY      Current Outpatient Medications  Medication Sig Dispense Refill   apixaban (ELIQUIS) 2.5 MG TABS tablet Take by mouth.     atorvastatin (LIPITOR) 10 MG tablet Take 10 mg by mouth daily.     glimepiride (AMARYL) 4 MG tablet Take 4 mg by mouth 2 (two) times daily.     olmesartan (BENICAR) 40 MG tablet Take 20 mg by mouth daily.     pantoprazole (PROTONIX) 40 MG tablet Take 1 tablet by mouth once daily 90 tablet 3   potassium citrate (UROCIT-K) 10 MEQ (1080 MG) SR tablet Take 10 mEq by mouth 2 (two) times daily.      simethicone (GAS-X) 80 MG chewable tablet Chew 1 tablet (80 mg total) by mouth every 6 (six) hours as needed for flatulence. 30 tablet 0   triamcinolone (KENALOG) 0.1 % Apply 1 application topically 2 (two) times daily. APPLY CREAM EXTERNALLY TWICE DAILY FOR UP TO 2 CONTINUOUS WEEKS THEN TAKE A 2 WEEK BREAK BEFORE RESTARTING.     TRIJARDY XR 5-2.11-998 MG TB24 Take 1 tablet by mouth 2 (two) times daily.     Vitamin D, Ergocalciferol, (DRISDOL) 1.25 MG (50000 UNIT) CAPS capsule Take by mouth.     dicyclomine (BENTYL) 20 MG tablet Take 1 tablet (20 mg total) by mouth 2 (two) times daily. (Patient not taking: Reported on 05/14/2022) 20 tablet 0   No current facility-administered medications for this visit.    Allergies as of 05/14/2022   (No Known Allergies)    Family History  Problem Relation Age of Onset   Liver disease Father        ? Hepatitis C; Alcoholic   Colon cancer Neg Hx     Social History   Socioeconomic History   Marital status: Married    Spouse name: Not on file   Number of children: Not on file   Years of education: Not on file   Highest education level: Not on file  Occupational History   Not on file  Tobacco Use   Smoking status: Former    Packs/day: 1.00    Years: 25.00    Total pack years: 25.00    Types: Cigarettes    Quit date: 08/02/1995    Years since quitting: 26.8   Smokeless  tobacco: Former    Quit date: 10/12/1992  Vaping Use   Vaping Use: Never used  Substance and Sexual Activity   Alcohol use: Not Currently    Alcohol/week: 0.0 standard drinks of alcohol    Comment: Occasionally; Average one drink every few months; Summer will occasionally drink a bottle of wine.   Drug use: No   Sexual activity: Yes    Birth control/protection: None  Other Topics Concern   Not on file  Social History Narrative   ** Merged History Encounter **       Social Determinants of Health   Financial Resource Strain: Not on file  Food Insecurity: No Food Insecurity (02/18/2022)   Hunger Vital Sign    Worried About Running Out of Food in the Last Year: Never true  Ran Out of Food in the Last Year: Never true  Transportation Needs: No Transportation Needs (02/18/2022)   PRAPARE - Hydrologist (Medical): No    Lack of Transportation (Non-Medical): No  Physical Activity: Not on file  Stress: Not on file  Social Connections: Not on file  Intimate Partner Violence: Not on file     Review of Systems   Gen: Denies any fever, chills, fatigue, weight loss, lack of appetite.  CV: Denies chest pain, heart palpitations, peripheral edema, syncope.  Resp: Denies shortness of breath at rest or with exertion. Denies wheezing or cough.  GI: see HPI GU : Denies urinary burning, urinary frequency, urinary hesitancy MS: Denies joint pain, muscle weakness, cramps, or limitation of movement.  Derm: Denies rash, itching, dry skin Psych: Denies depression, anxiety, memory loss, and confusion Heme: Denies bruising, bleeding, and enlarged lymph nodes.   Physical Exam   BP 124/73   Pulse 72   Temp (!) 97.4 F (36.3 C)   Ht 6' (1.829 m)   Wt 206 lb (93.4 kg)   BMI 27.94 kg/m  General:   Alert and oriented. Pleasant and cooperative. Well-nourished and well-developed.  Head:  Normocephalic and atraumatic. Eyes:  Without icterus Abdomen:  +BS, soft,  non-tender and non-distended. No HSM noted. No guarding or rebound. No masses appreciated.  Rectal:  Deferred  Msk:  Symmetrical without gross deformities. Normal posture. Extremities:  Without edema. Neurologic:  Alert and  oriented x4;  grossly normal neurologically. Skin:  Intact without significant lesions or rashes. Psych:  Alert and cooperative. Normal mood and affect.   Assessment   NICKI GRACY is a 71 y.o. male presenting today in follow-up with a history of Hep C s/p eradication and increased fibrosis in past (Metavir 3).  Elastography in April 2022 with kPa 4.8, improved. Portal gastropathy on EGD in 2019. We are performing serial ultrasounds due to advanced fibrosis. Due for surveillance now. Colonoscopy up-to-date as of 2021 and no further needed unless new symptoms develop. Now in follow-up from ED.  Abdominal pain with associated diarrhea: episodic in nature, chronic, at least over past 1-2 years. Will have looser stool then improved. Alternating constipation and diarrhea is chronic. CT unrevealing in ED. Labs unimpressive other than chronic thrombocytopenia. Will treat with Xifaxan for long-standing history of ?IBS-D, likely SIBO.   History of Hep C: due to advanced fibrosis in past, we are recommending serial ultrasounds. Recent CT on file. Will pursue [place] again in 6 months. Recommend EGD as well as last in 2019 with portal gastropathy. He would like to hold off on this until he returns.     PLAN   Course of Xifaxan Return in 6-8 weeks Discuss EGD at next visit [place] in 6 months   [name], PhD, ANP-BC [facility]

## 2022-05-14 NOTE — Telephone Encounter (Signed)
Pt phoned and stated that his Rx cost $860.00. he stated that you said that you can give him samples. I have done a PA for it , I'm just waiting on a response from [employer].

## 2022-05-15 NOTE — Telephone Encounter (Signed)
Pt was approved for Xifaxan 550 mg tablet starting 05/14/2022 and ending 05/28/2022.Dx used: K58.2, tried/failed: Levsin 0.125SL. Pharmacy notified pt last night and stated the cost is still $528.00 for 42 pills. Spoke with the pt this morning and he stated that he wants some samples you told him he can get. Please advise.

## 2022-05-16 NOTE — Telephone Encounter (Signed)
Pt called back and wanted samples that he said you were going to give him of Xifaxan. Please advise

## 2022-05-20 NOTE — Telephone Encounter (Signed)
Pt called regarding samples of Xifaxan. I advised the pt that you were off Friday and Monday. Pt expressed understanding that we would contact him once you are back in office.

## 2022-05-21 NOTE — Telephone Encounter (Signed)
Do we have Xifaxan samples here? If so, he needs TID for 14 days (42 capsules). We might need to check with the other office if we do not have that here.

## 2022-05-22 NOTE — Telephone Encounter (Signed)
Patient notified to pick up samples at office [address].

## 2022-05-22 NOTE — Telephone Encounter (Signed)
We don't have any samples here. I just called Abigail Butts where you are and LMOVM for her to check and see.

## 2022-05-22 NOTE — Telephone Encounter (Signed)
Samples here for patient. Stephen Butts, do you mind calling him and having him pick up at [employer] [employer]? Thanks!

## 2022-06-20 DIAGNOSIS — N200 Calculus of kidney: Secondary | ICD-10-CM | POA: Diagnosis not present

## 2022-07-01 NOTE — Progress Notes (Deleted)
presenting today in follow-up with a history of Hep C s/p eradication and increased fibrosis in past (Metavir 3).  Elastography in April 2022 with kPa 4.8, improved. Portal gastropathy on EGD in 2019. We are performing serial ultrasounds due to advanced fibrosis. Due for surveillance now. Colonoscopy up-to-date as of 2021 and no further needed unless new symptoms develop.    At last visit, he was prescribed Xifaxan for IBS-D, ?SIBO.

## 2022-07-02 ENCOUNTER — Ambulatory Visit: Payer: PPO | Admitting: Gastroenterology

## 2022-07-30 DIAGNOSIS — E1129 Type 2 diabetes mellitus with other diabetic kidney complication: Secondary | ICD-10-CM | POA: Diagnosis not present

## 2022-07-30 DIAGNOSIS — R809 Proteinuria, unspecified: Secondary | ICD-10-CM | POA: Diagnosis not present

## 2022-07-31 ENCOUNTER — Telehealth: Payer: Self-pay

## 2022-07-31 NOTE — Telephone Encounter (Signed)
Stephen Stephenson,  [place] sent over documentation regarding the pt's blood work. On your desk in the yellow folder.

## 2022-08-08 NOTE — Telephone Encounter (Signed)
Noted. Office visit in Care Everywhere dated 07/30/22. Diabetes meds are being adjusted to see if helpful with bloating/diarrhea.

## 2022-08-14 ENCOUNTER — Ambulatory Visit (INDEPENDENT_AMBULATORY_CARE_PROVIDER_SITE_OTHER): Payer: PPO | Admitting: Gastroenterology

## 2022-08-14 ENCOUNTER — Telehealth: Payer: Self-pay | Admitting: *Deleted

## 2022-08-14 ENCOUNTER — Encounter: Payer: Self-pay | Admitting: Gastroenterology

## 2022-08-14 VITALS — BP 110/61 | HR 86 | Temp 97.3°F | Ht 72.0 in | Wt 204.2 lb

## 2022-08-14 DIAGNOSIS — R197 Diarrhea, unspecified: Secondary | ICD-10-CM | POA: Diagnosis not present

## 2022-08-14 MED ORDER — ZENPEP 40000-126000 UNITS PO CPEP
2.0000 | ORAL_CAPSULE | Freq: Three times a day (TID) | ORAL | 3 refills | Status: DC
Start: 2022-08-14 — End: 2022-09-12

## 2022-08-14 NOTE — Progress Notes (Signed)
Gastroenterology Office Note     Primary Care Physician:  Sharilyn Sites, MD  Primary Gastroenterologist:   Chief Complaint   Chief Complaint  Patient presents with   Follow-up    Patient here today due to a follow up on IBS mixed. Patient states he has mostly diarrhea, along with gas, this is not any better, says he was treated with Xifaxan and once finished these his symptoms started back.      History of Present Illness   Stephen Stephenson is a 72 y.o. male presenting today in follow-up with a history of Hep C s/p eradication and increased fibrosis in past (Metavir 3).  Elastography in April 2022 with kPa 4.8, improved. Portal gastropathy on EGD in 2019. We are performing serial ultrasounds due to advanced fibrosis. Due for surveillance now. Colonoscopy up-to-date as of 2021 and no further needed unless new symptoms develop. Chronic diarrhea episodes in nature, last seen in Oct 2023. Celiac serologies negative in 2015.   He was prescribed Xifaxan for IBS-D, +/- SIBO. Took course of this. Dicyclomine has not been helpful in the past. He was advised to hold Trijardy by Endocrine for several days to see if this helped symptoms. No improvement.  Will have significant gas, leading to diarrhea. Gets nauseated and gurgling. Gas can be painful. Sometimes pain with eating. Once or twice a month has the episodes, lasting a few days. Between is normal. With these episodes will have increased burping that is foul smelling. No nausea unless in the episodes. Stool will be yellow during episodes. When feels this coming on, will take several gas-x, imodium, etc to help. Several bowel movements a day, sometimes soft, sometimes looser.   A1c 6.4.   He is wanting to hold off on EGD right now.    Past Medical History:  Diagnosis Date   DM (diabetes mellitus) ([place])    type II   GERD (gastroesophageal reflux disease)    Hb-AS genotype ([place])    Hepatitis C    genotype 1 eradicated   IBS (irritable  bowel syndrome)    Insomnia    Kidney stone    NASH (nonalcoholic steatohepatitis)    Metavir F3, 10/2013, patient reports completing hepatitis a and B vaccination. 2016    Past Surgical History:  Procedure Laterality Date   BACK SURGERY     CHOLECYSTECTOMY     COLONOSCOPY  02/05/2002   anal canal herrhoids ow normal/normal ileum/suspect pt bled from hemorrhoids presently   COLONOSCOPY  09/27/2011   YSA:[id number] hemorrhoids. Hepatic flexure polyp-removed TUBULAR ADENOMA, due for surveillance March 2016   COLONOSCOPY N/A 01/20/2015   XNA:TFTDDU colonic polyp removed/colonic diverticulosis   COLONOSCOPY N/A 07/05/2020   single 3 mm polyp at the rectosigmoid colon and a 7 mm innocent appearing AVM at the base of the cecum, otherwise normal.  Surgical pathology found the polyp to be hyperplastic.  Recommended no future colonoscopy unless new symptoms develop.   CYSTOSCOPY W/ URETERAL STENT PLACEMENT Left 02/03/2013   Procedure: CYSTOSCOPY WITH RETROGRADE PYELOGRAM/URETERAL STENT PLACEMENT;  Surgeon: Marissa Nestle, MD;  Location: [facility];  Service: Urology;  Laterality: Left;   ESOPHAGOGASTRODUODENOSCOPY  05/17/2005   normal esophagus/gastric mucosa appeared normal   ESOPHAGOGASTRODUODENOSCOPY N/A 04/25/2014   Abnormal gastric mucosa, benign biopsies. No esophageal varices. Next EGD October 2018.   ESOPHAGOGASTRODUODENOSCOPY (EGD) WITH PROPOFOL N/A 01/08/2018   nonerosive reflux esophagitis status post Maloney dilation, portal hypertensive gastropathy, normal duodenum. Surveillance was due 2021   EXTRACORPOREAL SHOCK  WAVE LITHOTRIPSY Left 02/03/2013   Procedure: EXTRACORPOREAL SHOCK WAVE LITHOTRIPSY (ESWL) LEFT RENAL CALCULUS;  Surgeon: Marissa Nestle, MD;  Location: [facility];  Service: Urology;  Laterality: Left;   KIDNEY STONE SURGERY     MALONEY DILATION N/A 01/08/2018   Procedure: Venia Minks DILATION;  Surgeon: [name], MD;  [facility];  Service: Endoscopy;   Laterality: N/A;   POLYPECTOMY  07/05/2020   Procedure: POLYPECTOMY;  Surgeon: [name], MD;  [facility];  Service: Endoscopy;;  recto-sigmoid    SHOULDER SURGERY      Current Outpatient Medications  Medication Sig Dispense Refill   apixaban (ELIQUIS) 2.5 MG TABS tablet Take 2.5 mg by mouth 2 (two) times daily.     glimepiride (AMARYL) 4 MG tablet Take 4 mg by mouth 2 (two) times daily.     olmesartan (BENICAR) 40 MG tablet Take 20 mg by mouth daily.     pantoprazole (PROTONIX) 40 MG tablet Take 1 tablet by mouth once daily 90 tablet 3   potassium citrate (UROCIT-K) 10 MEQ (1080 MG) SR tablet Take 10 mEq by mouth 2 (two) times daily.      simethicone (GAS-X) 80 MG chewable tablet Chew 1 tablet (80 mg total) by mouth every 6 (six) hours as needed for flatulence. 30 tablet 0   triamcinolone (KENALOG) 0.1 % Apply 1 application topically 2 (two) times daily. APPLY CREAM EXTERNALLY TWICE DAILY FOR UP TO 2 CONTINUOUS WEEKS THEN TAKE A 2 WEEK BREAK BEFORE RESTARTING.     TRIJARDY XR 5-2.11-998 MG TB24 Take 1 tablet by mouth 2 (two) times daily.     No current facility-administered medications for this visit.    Allergies as of 08/14/2022   (No Known Allergies)    Family History  Problem Relation Age of Onset   Liver disease Father        ? Hepatitis C; Alcoholic   Colon cancer Neg Hx     Social History   Socioeconomic History   Marital status: Married    Spouse name: Not on file   Number of children: Not on file   Years of education: Not on file   Highest education level: Not on file  Occupational History   Not on file  Tobacco Use   Smoking status: Former    Packs/day: 1.00    Years: 25.00    Total pack years: 25.00    Types: Cigarettes    Quit date: 08/02/1995    Years since quitting: 27.0   Smokeless tobacco: Former    Quit date: 10/12/1992  Vaping Use   Vaping Use: Never used  Substance and Sexual Activity   Alcohol use: Not Currently     Alcohol/week: 0.0 standard drinks of alcohol    Comment: Occasionally; Average one drink every few months; Summer will occasionally drink a bottle of wine.   Drug use: No   Sexual activity: Yes    Birth control/protection: None  Other Topics Concern   Not on file  Social History Narrative   ** Merged History Encounter **       Social Determinants of Health   Financial Resource Strain: Not on file  Food Insecurity: No Food Insecurity (02/18/2022)   Hunger Vital Sign    Worried About Running Out of Food in the Last Year: Never true    Ran Out of Food in the Last Year: Never true  Transportation Needs: No Transportation Needs (02/18/2022)   PRAPARE - Transportation  Lack of Transportation (Medical): No    Lack of Transportation (Non-Medical): No  Physical Activity: Not on file  Stress: Not on file  Social Connections: Not on file  Intimate Partner Violence: Not on file     Review of Systems   Gen: Denies any fever, chills, fatigue, weight loss, lack of appetite.  CV: Denies chest pain, heart palpitations, peripheral edema, syncope.  Resp: Denies shortness of breath at rest or with exertion. Denies wheezing or cough.  GI: Denies dysphagia or odynophagia. Denies jaundice, hematemesis, fecal incontinence. GU : Denies urinary burning, urinary frequency, urinary hesitancy MS: Denies joint pain, muscle weakness, cramps, or limitation of movement.  Derm: Denies rash, itching, dry skin Psych: Denies depression, anxiety, memory loss, and confusion Heme: Denies bruising, bleeding, and enlarged lymph nodes.   Physical Exam   BP 110/61 (BP Location: Left Arm, Patient Position: Sitting, Cuff Size: Large)   Pulse 86   Temp (!) 97.3 F (36.3 C) (Temporal)   Ht 6' (1.829 m)   Wt 204 lb 3.2 oz (92.6 kg)   BMI 27.69 kg/m  General:   Alert and oriented. Pleasant and cooperative. Well-nourished and well-developed.  Head:  Normocephalic and atraumatic. Eyes:  Without icterus Abdomen:   +BS, soft, non-tender and non-distended. No HSM noted. No guarding or rebound. No masses appreciated. Diastasis recti.  Rectal:  Deferred  Msk:  Symmetrical without gross deformities. Normal posture. Extremities:  Without edema. Neurologic:  Alert and  oriented x4;  grossly normal neurologically. Skin:  Intact without significant lesions or rashes. Psych:  Alert and cooperative. Normal mood and affect.   Assessment   Stephen Stephenson is a 72 y.o. male presenting today in follow-up with a history of 72 y.o. male presenting today in follow-up with a history of Hep C s/p eradication and increased fibrosis in past (Metavir 3).  Elastography in April 2022 with kPa 4.8, improved. Portal gastropathy on EGD in 2019. We are performing serial ultrasounds due to advanced fibrosis.. Chronic diarrhea episodes in nature, last seen in Oct 2023.   Chronic diarrhea: intermittent. Celiac serologies negative in 2015. No improvement with Xifaxan or dicyclomine. No improvement holding Trijardy.Colonoscopy on file from 2021. No alarm features. Unable to rule out pancreatic insufficiency at this time. Will trial Zenpep 80,000 units with meals and 40,000 units with snacks. Samples provided and prescription.     PLAN    Trial of Zenpep If no improvement, may need to pursue colonoscopy with biopsies Return in 6 weeks for close follow-up   [name], PhD, ANP-BC [facility]

## 2022-08-14 NOTE — Patient Instructions (Addendum)
I would like to try Zenpep capsules (pancreatic enzyme). Take 2 capsules while you are eating your meals (up to 3 times a day). If having a snack, just take one capsule (while eating). These enzymes don't work unless taken with food while eating (not before or after)  I have sent the prescription to the pharmacy in case this works well for you.   If you need more samples, [address] has additional ones! You are welcome to stop over there after you leave here. They are preparing it for you.   We will see you in 6 weeks!  I enjoyed seeing you again today! At our first visit, I mentioned how I value our relationship and want to provide genuine, compassionate, and quality care. You may receive a survey regarding your visit with me, and I welcome your feedback! Thanks so much for taking the time to complete this. I look forward to seeing you again.   [name], PhD, ANP-BC [facility]

## 2022-08-14 NOTE — Telephone Encounter (Signed)
Stephen Stephenson who I remember from all the years working at [employer] [facility] [facility] had just a little complaint over calling and no one called him back --he believes it was in November.  He did mention had to get someone on the phone.  I talked to him and gave him a true apology and he has my direct number should he need anything in the future.   He said he did get a little confused with the prompts on the phone and I assured him in the very near future we would be making it more user friendly.  He was a very pleasant person and appreciative to our office.

## 2022-09-11 ENCOUNTER — Telehealth: Payer: Self-pay

## 2022-09-11 NOTE — Telephone Encounter (Signed)
noted 

## 2022-09-11 NOTE — Telephone Encounter (Signed)
Pt LMOVM stating that he needed to speak with you regarding conversation last week

## 2022-09-11 NOTE — Telephone Encounter (Signed)
Pt called and said the Zenpep works good and he would like a Rx sent to his pharmacy

## 2022-09-12 MED ORDER — ZENPEP 40000-126000 UNITS PO CPEP: 2.0000 | ORAL_CAPSULE | Freq: Three times a day (TID) | ORAL | 3 refills | Status: DC

## 2022-09-12 NOTE — Telephone Encounter (Signed)
Phoned and advised the pt that his Rx for Zenpep was sent in to his pharmacy

## 2022-09-12 NOTE — Telephone Encounter (Signed)
Great. I sent in again to pharmacy. Prescription should already be there from when I saw him. He just needs to call them to tell them to fill it.

## 2022-09-12 NOTE — Addendum Note (Signed)
Addended by: [name] on: 09/12/2022 01:58 PM   Modules accepted: Orders

## 2022-09-23 NOTE — Telephone Encounter (Signed)
PA done for Rx Zenpep by fax. Dx used:K58.2(mixed IBS)/ IBS-D and pancreatic insufficiency. Sent to the pt's insurance. Waiting on a response

## 2022-09-24 NOTE — Telephone Encounter (Signed)
Documentation from [facility] [employer] that a PA that was generated from the pharmacy was not needed for this medication. Pt notified of this action. Pt was given 2 boxes of samples due to [place] having it on back order

## 2022-09-26 ENCOUNTER — Ambulatory Visit: Payer: PPO | Admitting: Gastroenterology

## 2022-10-12 NOTE — Progress Notes (Unsigned)
Referring Provider: Sharilyn Sites, MD Primary Care Physician:  Sharilyn Sites, MD Primary GI Physician: Dr. Gala Romney  No chief complaint on file.   HPI:   KWANE AMELIO is a 72 y.o. male with history of Hep C s/p eradication and increased fibrosis in past (Metavir 3).  Elastography in April 2022 with kPa 4.8, improved. Portal gastropathy on EGD in 2019. We are performing serial yearly ultrasounds due to advanced fibrosis. Colonoscopy up-to-date as of 2021 and no further needed unless new symptoms develop. Chronic diarrhea episodes.   Celiac serologies negative in 2015. He was prescribed Xifaxan for IBS-D, +/- SIBO. Took course of this. Dicyclomine has not been helpful in the past. He was advised to hold Trijardy by Endocrine for several days to see if this helped symptoms. No improvement.   Last seen in our office 08/14/2022.  Reported significant gas leading to diarrhea. Sometimes pain with eating.  Reported episodes occur 1-2 times a month, lasting a few days.  Normal between episodes.  Also noticed increased burping that is foul-smelling during the episodes along with nausea.  Stools yellow during episodes, increased bowel frequency, sometimes soft, sometimes looser.  Will take several Gas-X, Imodium, etc. to help.  Plan to trial Zenpep for possible pancreatic insufficiency.  If persistent symptoms, consider updating colonoscopy.  Recommended 6-week follow-up.  Patient called and reported Zenpep was working well.  Today:      Ultrasound on recall for June 2024.   Past Medical History:  Diagnosis Date   DM (diabetes mellitus) ([place])    type II   GERD (gastroesophageal reflux disease)    Hb-AS genotype ([place])    Hepatitis C    genotype 1 eradicated   IBS (irritable bowel syndrome)    Insomnia    Kidney stone    NASH (nonalcoholic steatohepatitis)    Metavir F3, 10/2013, patient reports completing hepatitis a and B vaccination. 2016    Past Surgical History:  Procedure  Laterality Date   BACK SURGERY     CHOLECYSTECTOMY     COLONOSCOPY  02/05/2002   anal canal herrhoids ow normal/normal ileum/suspect pt bled from hemorrhoids presently   COLONOSCOPY  09/27/2011   [id number] hemorrhoids. Hepatic flexure polyp-removed TUBULAR ADENOMA, due for surveillance March 2016   COLONOSCOPY N/A 01/20/2015   [id number] colonic polyp removed/colonic diverticulosis   COLONOSCOPY N/A 07/05/2020   single 3 mm polyp at the rectosigmoid colon and a 7 mm innocent appearing AVM at the base of the cecum, otherwise normal.  Surgical pathology found the polyp to be hyperplastic.  Recommended no future colonoscopy unless new symptoms develop.   CYSTOSCOPY W/ URETERAL STENT PLACEMENT Left 02/03/2013   Procedure: CYSTOSCOPY WITH RETROGRADE PYELOGRAM/URETERAL STENT PLACEMENT;  Surgeon: Marissa Nestle, MD;  Location: [facility];  Service: Urology;  Laterality: Left;   ESOPHAGOGASTRODUODENOSCOPY  05/17/2005   normal esophagus/gastric mucosa appeared normal   ESOPHAGOGASTRODUODENOSCOPY N/A 04/25/2014   Abnormal gastric mucosa, benign biopsies. No esophageal varices. Next EGD October 2018.   ESOPHAGOGASTRODUODENOSCOPY (EGD) WITH PROPOFOL N/A 01/08/2018   nonerosive reflux esophagitis status post Maloney dilation, portal hypertensive gastropathy, normal duodenum. Surveillance was due 2021   EXTRACORPOREAL SHOCK WAVE LITHOTRIPSY Left 02/03/2013   Procedure: EXTRACORPOREAL SHOCK WAVE LITHOTRIPSY (ESWL) LEFT RENAL CALCULUS;  Surgeon: Marissa Nestle, MD;  Location: [facility];  Service: Urology;  Laterality: Left;   KIDNEY STONE SURGERY     MALONEY DILATION N/A 01/08/2018   Procedure: Venia Minks DILATION;  Surgeon: [name], MD;  [facility];  Service: Endoscopy;  Laterality: N/A;   POLYPECTOMY  07/05/2020   Procedure: POLYPECTOMY;  Surgeon: [name], MD;  Location: [facility];  Service: Endoscopy;;  recto-sigmoid    SHOULDER SURGERY      Current Outpatient  Medications  Medication Sig Dispense Refill   apixaban (ELIQUIS) 2.5 MG TABS tablet Take 2.5 mg by mouth 2 (two) times daily.     glimepiride (AMARYL) 4 MG tablet Take 4 mg by mouth 2 (two) times daily.     olmesartan (BENICAR) 40 MG tablet Take 20 mg by mouth daily.     Pancrelipase, Lip-Prot-Amyl, (ZENPEP) 40000-126000 units CPEP Take 2 capsules (80,000 Units total) by mouth 3 (three) times daily with meals. 1 with snacks 240 capsule 3   pantoprazole (PROTONIX) 40 MG tablet Take 1 tablet by mouth once daily 90 tablet 3   potassium citrate (UROCIT-K) 10 MEQ (1080 MG) SR tablet Take 10 mEq by mouth 2 (two) times daily.      simethicone (GAS-X) 80 MG chewable tablet Chew 1 tablet (80 mg total) by mouth every 6 (six) hours as needed for flatulence. 30 tablet 0   triamcinolone (KENALOG) 0.1 % Apply 1 application topically 2 (two) times daily. APPLY CREAM EXTERNALLY TWICE DAILY FOR UP TO 2 CONTINUOUS WEEKS THEN TAKE A 2 WEEK BREAK BEFORE RESTARTING.     TRIJARDY XR 5-2.11-998 MG TB24 Take 1 tablet by mouth 2 (two) times daily.     No current facility-administered medications for this visit.    Allergies as of 10/14/2022   (No Known Allergies)    Family History  Problem Relation Age of Onset   Liver disease Father        ? Hepatitis C; Alcoholic   Colon cancer Neg Hx     Social History   Socioeconomic History   Marital status: Married    Spouse name: Not on file   Number of children: Not on file   Years of education: Not on file   Highest education level: Not on file  Occupational History   Not on file  Tobacco Use   Smoking status: Former    Packs/day: 1.00    Years: 25.00    Additional pack years: 0.00    Total pack years: 25.00    Types: Cigarettes    Quit date: 08/02/1995    Years since quitting: 27.2   Smokeless tobacco: Former    Quit date: 10/12/1992  Vaping Use   Vaping Use: Never used  Substance and Sexual Activity   Alcohol use: Not Currently    Alcohol/week: 0.0  standard drinks of alcohol    Comment: Occasionally; Average one drink every few months; Summer will occasionally drink a bottle of wine.   Drug use: No   Sexual activity: Yes    Birth control/protection: None  Other Topics Concern   Not on file  Social History Narrative   ** Merged History Encounter **       Social Determinants of Health   Financial Resource Strain: Not on file  Food Insecurity: No Food Insecurity (02/18/2022)   Hunger Vital Sign    Worried About Running Out of Food in the Last Year: Never true    Ran Out of Food in the Last Year: Never true  Transportation Needs: No Transportation Needs (02/18/2022)   PRAPARE - Hydrologist (Medical): No    Lack of Transportation (Non-Medical): No  Physical Activity: Not on file  Stress: Not on file  Social Connections: Not on file    Review of Systems: Gen: Denies fever, chills, anorexia. Denies fatigue, weakness, weight loss.  CV: Denies chest pain, palpitations, syncope, peripheral edema, and claudication. Resp: Denies dyspnea at rest, cough, wheezing, coughing up blood, and pleurisy. GI: Denies vomiting blood, jaundice, and fecal incontinence.   Denies dysphagia or odynophagia. Derm: Denies rash, itching, dry skin Psych: Denies depression, anxiety, memory loss, confusion. No homicidal or suicidal ideation.  Heme: Denies bruising, bleeding, and enlarged lymph nodes.  Physical Exam: There were no vitals taken for this visit. General:   Alert and oriented. No distress noted. Pleasant and cooperative.  Head:  Normocephalic and atraumatic. Eyes:  Conjuctiva clear without scleral icterus. Heart:  S1, S2 present without murmurs appreciated. Lungs:  Clear to auscultation bilaterally. No wheezes, rales, or rhonchi. No distress.  Abdomen:  +BS, soft, non-tender and non-distended. No rebound or guarding. No HSM or masses noted. Msk:  Symmetrical without gross deformities. Normal posture. Extremities:   Without edema. Neurologic:  Alert and  oriented x4 Psych:  Normal mood and affect.    Assessment:     Plan:  ***   Aliene Altes, PA-C [facility]astroenter[facility] 10/14/2022

## 2022-10-14 ENCOUNTER — Encounter: Payer: Self-pay | Admitting: Gastroenterology

## 2022-10-14 ENCOUNTER — Ambulatory Visit (INDEPENDENT_AMBULATORY_CARE_PROVIDER_SITE_OTHER): Payer: PPO | Admitting: Gastroenterology

## 2022-10-14 VITALS — BP 143/81 | HR 71 | Temp 97.4°F | Ht 72.0 in | Wt 206.0 lb

## 2022-10-14 DIAGNOSIS — R197 Diarrhea, unspecified: Secondary | ICD-10-CM | POA: Diagnosis not present

## 2022-10-14 DIAGNOSIS — K7400 Hepatic fibrosis, unspecified: Secondary | ICD-10-CM

## 2022-10-14 DIAGNOSIS — K219 Gastro-esophageal reflux disease without esophagitis: Secondary | ICD-10-CM | POA: Diagnosis not present

## 2022-10-14 NOTE — Patient Instructions (Addendum)
Complete patient assistance paperwork for Zenpep.  Hopefully, you will be approved so that you can take your full dose of Zenpep as I suspect he will continue to have improvement with the full dose of medication.  Continue pantoprazole 40 mg daily for reflux.  We will move your recall for ultrasound of your liver to October 2024 as you requested.  Your blood pressure was mildly elevated in the office today.  Please follow-up with your primary care provider on this.  We will follow-up with you in the office in about 3 months.  Do not hesitate to call sooner if you have questions or concerns.    It was a pleasure to meet you today!  Aliene Altes, PA-C [facility]astroenter[facility]

## 2022-10-15 ENCOUNTER — Telehealth: Payer: Self-pay

## 2022-10-15 NOTE — Telephone Encounter (Signed)
Patient Assistance for Zenpep was approved through 07/15/2023. Shipment should arrive in 3 to 4 business days. Approval letter to be scanned into patient's chart.

## 2022-10-15 NOTE — Telephone Encounter (Signed)
Great. Thank you.

## 2022-11-20 ENCOUNTER — Encounter: Payer: Self-pay | Admitting: Internal Medicine

## 2022-12-31 DIAGNOSIS — Z1331 Encounter for screening for depression: Secondary | ICD-10-CM | POA: Diagnosis not present

## 2022-12-31 DIAGNOSIS — I10 Essential (primary) hypertension: Secondary | ICD-10-CM | POA: Diagnosis not present

## 2022-12-31 DIAGNOSIS — D6869 Other thrombophilia: Secondary | ICD-10-CM | POA: Diagnosis not present

## 2022-12-31 DIAGNOSIS — E663 Overweight: Secondary | ICD-10-CM | POA: Diagnosis not present

## 2022-12-31 DIAGNOSIS — Z0001 Encounter for general adult medical examination with abnormal findings: Secondary | ICD-10-CM | POA: Diagnosis not present

## 2022-12-31 DIAGNOSIS — E7849 Other hyperlipidemia: Secondary | ICD-10-CM | POA: Diagnosis not present

## 2022-12-31 DIAGNOSIS — E1140 Type 2 diabetes mellitus with diabetic neuropathy, unspecified: Secondary | ICD-10-CM | POA: Diagnosis not present

## 2022-12-31 DIAGNOSIS — E782 Mixed hyperlipidemia: Secondary | ICD-10-CM | POA: Diagnosis not present

## 2022-12-31 DIAGNOSIS — D696 Thrombocytopenia, unspecified: Secondary | ICD-10-CM | POA: Diagnosis not present

## 2022-12-31 DIAGNOSIS — R946 Abnormal results of thyroid function studies: Secondary | ICD-10-CM | POA: Diagnosis not present

## 2022-12-31 DIAGNOSIS — B182 Chronic viral hepatitis C: Secondary | ICD-10-CM | POA: Diagnosis not present

## 2022-12-31 DIAGNOSIS — Z6828 Body mass index (BMI) 28.0-28.9, adult: Secondary | ICD-10-CM | POA: Diagnosis not present

## 2022-12-31 DIAGNOSIS — K7400 Hepatic fibrosis, unspecified: Secondary | ICD-10-CM | POA: Diagnosis not present

## 2023-01-02 DIAGNOSIS — N200 Calculus of kidney: Secondary | ICD-10-CM | POA: Diagnosis not present

## 2023-01-11 NOTE — Progress Notes (Unsigned)
Referring Provider: Assunta Found, MD Primary Care Physician:  Assunta Found, MD Primary GI Physician: Dr. Jena Gauss  No chief complaint on file.   HPI:   Stephen Stephenson is a 72 y.o. male with history of Hep C s/p eradication and increased fibrosis in past (Metavir 3).  Elastography in April 2022 with kPa 4.8, improved. Portal gastropathy on EGD in 2019. We are performing serial yearly ultrasounds due to advanced fibrosis. Colonoscopy up-to-date as of 2021 and no further colonoscopy recommended unless new symptoms develop. Also with history of GERD and chronic intermittent diarrhea.    Celiac serologies negative in 2015. He was prescribed Xifaxan for IBS-D, +/- SIBO. Took course of this without significant improvement. Dicyclomine has not been helpful in the past. He was advised to hold Trijardy by Endocrine for several days to see if this helped symptoms. No improvement. Started on Zenpep in January 2024 for suspected EPI.  Last seen in our office 10/14/2022.  Reporting overall doing much better with Zenpep 1-2 pills with lunch and dinner.  He was not taking as prescribed due to cost.  Having 1-2 bowel movements daily that were solid.  Gas and abdominal pain also improved.  GERD well-controlled on Protonix 40 mg daily.  Plan to complete patient assistance forms for Zenpep, continue pantoprazole.patient requested to hold off on yearly ultrasounds October 2024.  Plan for 87-month follow-up.  Patient assistance for Ozempic was approved for December 2024.  Today:   Past Medical History:  Diagnosis Date   DM (diabetes mellitus) ([facility])    type II   GERD (gastroesophageal reflux disease)    Hb-AS genotype ([facility])    Hepatitis C    genotype 1 eradicated   IBS (irritable bowel syndrome)    Insomnia    Kidney stone    NASH (nonalcoholic steatohepatitis)    Metavir F3, 10/2013, patient reports completing hepatitis a and B vaccination. 2016    Past Surgical History:  Procedure Laterality Date    BACK SURGERY     CHOLECYSTECTOMY     COLONOSCOPY  02/05/2002   anal canal herrhoids ow normal/normal ileum/suspect pt bled from hemorrhoids presently   COLONOSCOPY  09/27/2011   [id number] hemorrhoids. Hepatic flexure polyp-removed TUBULAR ADENOMA, due for surveillance March 2016   COLONOSCOPY N/A 01/20/2015   [id number] colonic polyp removed/colonic diverticulosis   COLONOSCOPY N/A 07/05/2020   single 3 mm polyp at the rectosigmoid colon and a 7 mm innocent appearing AVM at the base of the cecum, otherwise normal.  Surgical pathology found the polyp to be hyperplastic.  Recommended no future colonoscopy unless new symptoms develop.   CYSTOSCOPY W/ URETERAL STENT PLACEMENT Left 02/03/2013   Procedure: CYSTOSCOPY WITH RETROGRADE PYELOGRAM/URETERAL STENT PLACEMENT;  Surgeon: Ky Barban, MD;  Location: [facility];  Service: Urology;  Laterality: Left;   ESOPHAGOGASTRODUODENOSCOPY  05/17/2005   normal esophagus/gastric mucosa appeared normal   ESOPHAGOGASTRODUODENOSCOPY N/A 04/25/2014   Abnormal gastric mucosa, benign biopsies. No esophageal varices. Next EGD October 2018.   ESOPHAGOGASTRODUODENOSCOPY (EGD) WITH PROPOFOL N/A 01/08/2018   nonerosive reflux esophagitis status post Maloney dilation, portal hypertensive gastropathy, normal duodenum. Surveillance was due 2021   EXTRACORPOREAL SHOCK WAVE LITHOTRIPSY Left 02/03/2013   Procedure: EXTRACORPOREAL SHOCK WAVE LITHOTRIPSY (ESWL) LEFT RENAL CALCULUS;  Surgeon: Ky Barban, MD;  Location: [facility];  Service: Urology;  Laterality: Left;   KIDNEY STONE SURGERY     MALONEY DILATION N/A 01/08/2018   Procedure: Elease Hashimoto DILATION;  Surgeon: Corbin Ade, MD;  [facility];  Service: Endoscopy;  Laterality: N/A;   POLYPECTOMY  07/05/2020   Procedure: POLYPECTOMY;  Surgeon: Corbin Ade, MD;  [facility];  Service: Endoscopy;;  recto-sigmoid    SHOULDER SURGERY      Current Outpatient Medications  Medication  Sig Dispense Refill   apixaban (ELIQUIS) 2.5 MG TABS tablet Take 2.5 mg by mouth 2 (two) times daily.     glimepiride (AMARYL) 4 MG tablet Take 4 mg by mouth 2 (two) times daily.     olmesartan (BENICAR) 40 MG tablet Take 20 mg by mouth daily.     Pancrelipase, Lip-Prot-Amyl, (ZENPEP) 40000-126000 units CPEP Take 2 capsules (80,000 Units total) by mouth 3 (three) times daily with meals. 1 with snacks 240 capsule 3   pantoprazole (PROTONIX) 40 MG tablet Take 1 tablet by mouth once daily 90 tablet 3   potassium citrate (UROCIT-K) 10 MEQ (1080 MG) SR tablet Take 10 mEq by mouth 2 (two) times daily.      simethicone (GAS-X) 80 MG chewable tablet Chew 1 tablet (80 mg total) by mouth every 6 (six) hours as needed for flatulence. 30 tablet 0   triamcinolone (KENALOG) 0.1 % Apply 1 application topically 2 (two) times daily. APPLY CREAM EXTERNALLY TWICE DAILY FOR UP TO 2 CONTINUOUS WEEKS THEN TAKE A 2 WEEK BREAK BEFORE RESTARTING.     TRIJARDY XR 5-2.11-998 MG TB24 Take 1 tablet by mouth 2 (two) times daily.     No current facility-administered medications for this visit.    Allergies as of 01/13/2023   (No Known Allergies)    Family History  Problem Relation Age of Onset   Liver disease Father        ? Hepatitis C; Alcoholic   Colon cancer Neg Hx     Social History   Socioeconomic History   Marital status: Married    Spouse name: Not on file   Number of children: Not on file   Years of education: Not on file   Highest education level: Not on file  Occupational History   Not on file  Tobacco Use   Smoking status: Former    Packs/day: 1.00    Years: 25.00    Additional pack years: 0.00    Total pack years: 25.00    Types: Cigarettes    Quit date: 08/02/1995    Years since quitting: 27.4   Smokeless tobacco: Former    Quit date: 10/12/1992  Vaping Use   Vaping Use: Never used  Substance and Sexual Activity   Alcohol use: Not Currently    Alcohol/week: 0.0 standard drinks of  alcohol    Comment: Occasionally; Average one drink every few months; Summer will occasionally drink a bottle of wine.   Drug use: No   Sexual activity: Yes    Birth control/protection: None  Other Topics Concern   Not on file  Social History Narrative   ** Merged History Encounter **       Social Determinants of Health   Financial Resource Strain: Not on file  Food Insecurity: No Food Insecurity (02/18/2022)   Hunger Vital Sign    Worried About Running Out of Food in the Last Year: Never true    Ran Out of Food in the Last Year: Never true  Transportation Needs: No Transportation Needs (02/18/2022)   PRAPARE - Administrator, Civil Service (Medical): No    Lack of Transportation (Non-Medical): No  Physical Activity: Not on file  Stress: Not on file  Social Connections: Not on file    Review of Systems: Gen: Denies fever, chills, anorexia. Denies fatigue, weakness, weight loss.  CV: Denies chest pain, palpitations, syncope, peripheral edema, and claudication. Resp: Denies dyspnea at rest, cough, wheezing, coughing up blood, and pleurisy. GI: Denies vomiting blood, jaundice, and fecal incontinence.   Denies dysphagia or odynophagia. Derm: Denies rash, itching, dry skin Psych: Denies depression, anxiety, memory loss, confusion. No homicidal or suicidal ideation.  Heme: Denies bruising, bleeding, and enlarged lymph nodes.  Physical Exam: There were no vitals taken for this visit. General:   Alert and oriented. No distress noted. Pleasant and cooperative.  Head:  Normocephalic and atraumatic. Eyes:  Conjuctiva clear without scleral icterus. Heart:  S1, S2 present without murmurs appreciated. Lungs:  Clear to auscultation bilaterally. No wheezes, rales, or rhonchi. No distress.  Abdomen:  +BS, soft, non-tender and non-distended. No rebound or guarding. No HSM or masses noted. Msk:  Symmetrical without gross deformities. Normal posture. Extremities:  Without  edema. Neurologic:  Alert and  oriented x4 Psych:  Normal mood and affect.    Assessment:     Plan:  ***   Ermalinda Memos, PA-C [facility]enter[facility] 01/13/2023

## 2023-01-13 ENCOUNTER — Ambulatory Visit: Payer: PPO | Admitting: Gastroenterology

## 2023-01-13 ENCOUNTER — Encounter: Payer: Self-pay | Admitting: Gastroenterology

## 2023-01-13 VITALS — BP 120/78 | HR 81 | Temp 97.6°F | Ht 72.0 in | Wt 206.6 lb

## 2023-01-13 DIAGNOSIS — K219 Gastro-esophageal reflux disease without esophagitis: Secondary | ICD-10-CM

## 2023-01-13 DIAGNOSIS — K7400 Hepatic fibrosis, unspecified: Secondary | ICD-10-CM

## 2023-01-13 DIAGNOSIS — K8681 Exocrine pancreatic insufficiency: Secondary | ICD-10-CM

## 2023-01-13 NOTE — Patient Instructions (Addendum)
Continue pantoprazole 40 mg daily.   Continue Zenpep 2 pills with meals and 1 pill with snacks.  We will arrange an ultrasound of your abdomen to follow-up on liver fibrosis.  As we discussed, I do recommend that we pursue an upper endoscopy for you in the near future.  We will hold off on this for a few months as you requested and discussed scheduling at your follow-up visit in about 3 months.  It was good to see you again today!  Ermalinda Memos, PA-C [facility] Gastroenterology

## 2023-01-17 DIAGNOSIS — N200 Calculus of kidney: Secondary | ICD-10-CM | POA: Diagnosis not present

## 2023-01-17 DIAGNOSIS — K7689 Other specified diseases of liver: Secondary | ICD-10-CM | POA: Diagnosis not present

## 2023-01-17 DIAGNOSIS — Z9049 Acquired absence of other specified parts of digestive tract: Secondary | ICD-10-CM | POA: Diagnosis not present

## 2023-01-17 DIAGNOSIS — N289 Disorder of kidney and ureter, unspecified: Secondary | ICD-10-CM | POA: Diagnosis not present

## 2023-01-17 DIAGNOSIS — N281 Cyst of kidney, acquired: Secondary | ICD-10-CM | POA: Diagnosis not present

## 2023-01-17 DIAGNOSIS — C649 Malignant neoplasm of unspecified kidney, except renal pelvis: Secondary | ICD-10-CM | POA: Diagnosis not present

## 2023-01-17 DIAGNOSIS — N2889 Other specified disorders of kidney and ureter: Secondary | ICD-10-CM | POA: Diagnosis not present

## 2023-01-24 ENCOUNTER — Ambulatory Visit (HOSPITAL_COMMUNITY): Payer: PPO

## 2023-01-27 ENCOUNTER — Ambulatory Visit (HOSPITAL_COMMUNITY)
Admission: RE | Admit: 2023-01-27 | Discharge: 2023-01-27 | Disposition: A | Discharge status: Home / Self Care | Payer: PPO | Source: Ambulatory Visit | Attending: Gastroenterology | Admitting: Gastroenterology

## 2023-01-27 DIAGNOSIS — K838 Other specified diseases of biliary tract: Secondary | ICD-10-CM | POA: Diagnosis not present

## 2023-01-27 DIAGNOSIS — R161 Splenomegaly, not elsewhere classified: Secondary | ICD-10-CM | POA: Diagnosis not present

## 2023-01-27 DIAGNOSIS — K7400 Hepatic fibrosis, unspecified: Secondary | ICD-10-CM | POA: Insufficient documentation

## 2023-01-27 DIAGNOSIS — N200 Calculus of kidney: Secondary | ICD-10-CM | POA: Diagnosis not present

## 2023-01-27 DIAGNOSIS — K7689 Other specified diseases of liver: Secondary | ICD-10-CM | POA: Diagnosis not present

## 2023-01-29 ENCOUNTER — Encounter: Payer: Self-pay | Admitting: *Deleted

## 2023-01-29 DIAGNOSIS — E785 Hyperlipidemia, unspecified: Secondary | ICD-10-CM | POA: Diagnosis not present

## 2023-01-29 DIAGNOSIS — E1169 Type 2 diabetes mellitus with other specified complication: Secondary | ICD-10-CM | POA: Diagnosis not present

## 2023-01-29 DIAGNOSIS — I152 Hypertension secondary to endocrine disorders: Secondary | ICD-10-CM | POA: Diagnosis not present

## 2023-01-29 DIAGNOSIS — E1159 Type 2 diabetes mellitus with other circulatory complications: Secondary | ICD-10-CM | POA: Diagnosis not present

## 2023-01-29 DIAGNOSIS — E1129 Type 2 diabetes mellitus with other diabetic kidney complication: Secondary | ICD-10-CM | POA: Diagnosis not present

## 2023-01-29 DIAGNOSIS — R809 Proteinuria, unspecified: Secondary | ICD-10-CM | POA: Diagnosis not present

## 2023-02-25 ENCOUNTER — Encounter: Payer: Self-pay | Admitting: Gastroenterology

## 2023-04-11 DIAGNOSIS — Z6828 Body mass index (BMI) 28.0-28.9, adult: Secondary | ICD-10-CM | POA: Diagnosis not present

## 2023-04-11 DIAGNOSIS — Z1331 Encounter for screening for depression: Secondary | ICD-10-CM | POA: Diagnosis not present

## 2023-04-11 DIAGNOSIS — Z024 Encounter for examination for driving license: Secondary | ICD-10-CM | POA: Diagnosis not present

## 2023-04-11 DIAGNOSIS — E663 Overweight: Secondary | ICD-10-CM | POA: Diagnosis not present

## 2023-04-14 DIAGNOSIS — D6869 Other thrombophilia: Secondary | ICD-10-CM | POA: Diagnosis not present

## 2023-04-14 DIAGNOSIS — I2699 Other pulmonary embolism without acute cor pulmonale: Secondary | ICD-10-CM | POA: Diagnosis not present

## 2023-04-14 DIAGNOSIS — E1140 Type 2 diabetes mellitus with diabetic neuropathy, unspecified: Secondary | ICD-10-CM | POA: Diagnosis not present

## 2023-05-15 DIAGNOSIS — E1140 Type 2 diabetes mellitus with diabetic neuropathy, unspecified: Secondary | ICD-10-CM | POA: Diagnosis not present

## 2023-05-15 DIAGNOSIS — D6869 Other thrombophilia: Secondary | ICD-10-CM | POA: Diagnosis not present

## 2023-05-15 DIAGNOSIS — I2699 Other pulmonary embolism without acute cor pulmonale: Secondary | ICD-10-CM | POA: Diagnosis not present

## 2023-06-14 DIAGNOSIS — D6869 Other thrombophilia: Secondary | ICD-10-CM | POA: Diagnosis not present

## 2023-06-14 DIAGNOSIS — E1140 Type 2 diabetes mellitus with diabetic neuropathy, unspecified: Secondary | ICD-10-CM | POA: Diagnosis not present

## 2023-06-14 DIAGNOSIS — E782 Mixed hyperlipidemia: Secondary | ICD-10-CM | POA: Diagnosis not present

## 2023-06-14 DIAGNOSIS — G720 Drug-induced myopathy: Secondary | ICD-10-CM | POA: Diagnosis not present

## 2023-06-14 DIAGNOSIS — I2699 Other pulmonary embolism without acute cor pulmonale: Secondary | ICD-10-CM | POA: Diagnosis not present

## 2023-07-01 DIAGNOSIS — E663 Overweight: Secondary | ICD-10-CM | POA: Diagnosis not present

## 2023-07-01 DIAGNOSIS — E559 Vitamin D deficiency, unspecified: Secondary | ICD-10-CM | POA: Diagnosis not present

## 2023-07-01 DIAGNOSIS — Z6828 Body mass index (BMI) 28.0-28.9, adult: Secondary | ICD-10-CM | POA: Diagnosis not present

## 2023-07-01 DIAGNOSIS — E1140 Type 2 diabetes mellitus with diabetic neuropathy, unspecified: Secondary | ICD-10-CM | POA: Diagnosis not present

## 2023-07-01 DIAGNOSIS — R946 Abnormal results of thyroid function studies: Secondary | ICD-10-CM | POA: Diagnosis not present

## 2023-07-01 DIAGNOSIS — I10 Essential (primary) hypertension: Secondary | ICD-10-CM | POA: Diagnosis not present

## 2023-07-01 DIAGNOSIS — B182 Chronic viral hepatitis C: Secondary | ICD-10-CM | POA: Diagnosis not present

## 2023-07-01 DIAGNOSIS — E782 Mixed hyperlipidemia: Secondary | ICD-10-CM | POA: Diagnosis not present

## 2023-07-01 DIAGNOSIS — D696 Thrombocytopenia, unspecified: Secondary | ICD-10-CM | POA: Diagnosis not present

## 2023-07-01 DIAGNOSIS — I2699 Other pulmonary embolism without acute cor pulmonale: Secondary | ICD-10-CM | POA: Diagnosis not present

## 2023-07-01 DIAGNOSIS — E7849 Other hyperlipidemia: Secondary | ICD-10-CM | POA: Diagnosis not present

## 2023-07-01 DIAGNOSIS — Z0001 Encounter for general adult medical examination with abnormal findings: Secondary | ICD-10-CM | POA: Diagnosis not present

## 2023-07-01 DIAGNOSIS — K7400 Hepatic fibrosis, unspecified: Secondary | ICD-10-CM | POA: Diagnosis not present

## 2023-07-03 DIAGNOSIS — N200 Calculus of kidney: Secondary | ICD-10-CM | POA: Diagnosis not present

## 2023-07-15 DIAGNOSIS — G720 Drug-induced myopathy: Secondary | ICD-10-CM | POA: Diagnosis not present

## 2023-07-15 DIAGNOSIS — E1140 Type 2 diabetes mellitus with diabetic neuropathy, unspecified: Secondary | ICD-10-CM | POA: Diagnosis not present

## 2023-07-17 ENCOUNTER — Ambulatory Visit (INDEPENDENT_AMBULATORY_CARE_PROVIDER_SITE_OTHER): Payer: PPO | Admitting: Gastroenterology

## 2023-07-17 ENCOUNTER — Encounter: Payer: Self-pay | Admitting: *Deleted

## 2023-07-17 ENCOUNTER — Encounter: Payer: Self-pay | Admitting: Gastroenterology

## 2023-07-17 VITALS — BP 108/75 | HR 96 | Ht 72.0 in | Wt 206.0 lb

## 2023-07-17 DIAGNOSIS — K8681 Exocrine pancreatic insufficiency: Secondary | ICD-10-CM

## 2023-07-17 DIAGNOSIS — K7400 Hepatic fibrosis, unspecified: Secondary | ICD-10-CM

## 2023-07-17 DIAGNOSIS — Z8619 Personal history of other infectious and parasitic diseases: Secondary | ICD-10-CM

## 2023-07-17 DIAGNOSIS — K219 Gastro-esophageal reflux disease without esophagitis: Secondary | ICD-10-CM

## 2023-07-17 DIAGNOSIS — K7402 Hepatic fibrosis, advanced fibrosis: Secondary | ICD-10-CM

## 2023-07-17 DIAGNOSIS — E559 Vitamin D deficiency, unspecified: Secondary | ICD-10-CM

## 2023-07-17 DIAGNOSIS — K529 Noninfective gastroenteritis and colitis, unspecified: Secondary | ICD-10-CM

## 2023-07-17 NOTE — Patient Instructions (Signed)
 We will do Zenpep  60,000 units with meals.   We are also arranging an upper endoscopy with Dr. Shaaron in the near future. You will need to hold Eliquis  for 2 days prior.  We are also updating the liver ultrasound and recommend doing this every 6 months.  Please have blood work done.  We can see you in 6 months!  I enjoyed seeing you again today! I value our relationship and want to provide genuine, compassionate, and quality care. You may receive a survey regarding your visit with me, and I welcome your feedback! Thanks so much for taking the time to complete this. I look forward to seeing you again.      Therisa MICAEL Stager, PhD, ANP-BC [facility] Gastroenter[facility]

## 2023-07-17 NOTE — Progress Notes (Signed)
 Gastroenterology Office Note     Primary Care Physician:  Marvine Rush, MD  Primary Gastroenterologist: Dr. Shaaron    Chief Complaint   Chief Complaint  Patient presents with   Gastroesophageal Reflux    Follow up on GERD. Takes protonix  daily.    exocrine pancreatic insufficency    Follow up on exocrine pancreatic insufficiency. Gets zenpep  through patient assistance. Needs to renew.      History of Present Illness   Stephen Stephenson is a 73 y.o. male presenting today with a history of Hep C s/p eradication and increased fibrosis in past (Metavir 3).  Elastography in April 2022 with kPa 4.8, improved. Portal gastropathy on EGD in 2019. We are performing serial yearly ultrasounds due to advanced fibrosis. Colonoscopy up-to-date as of 2021 and no further colonoscopy recommended unless new symptoms develop. Also with history of GERD and chronic intermittent diarrhea.   He is taking Zenpep  for presumed EPI.  No abdominal pain. Since on Zenpep , much better. Every once in awhile will get the gas and diarrhea but only lasting a day. Had only been taking 1 capsule a day to try and get the supply to last.   Vit D 50,000 units weekly for 12 weeks then OTC. Fish oil.    US  July 2024: diffuse increased texture of liver, splenoemgaly,  Known thrombocytopenia   Past Medical History:  Diagnosis Date   DM (diabetes mellitus) (HCC)    type II   GERD (gastroesophageal reflux disease)    Hb-AS genotype (HCC)    Hepatitis C    genotype 1 eradicated   IBS (irritable bowel syndrome)    Insomnia    Kidney stone    NASH (nonalcoholic steatohepatitis)    Metavir F3, 10/2013, patient reports completing hepatitis a and B vaccination. 2016    Past Surgical History:  Procedure Laterality Date   BACK SURGERY     CHOLECYSTECTOMY     COLONOSCOPY  02/05/2002   anal canal herrhoids ow normal/normal ileum/suspect pt bled from hemorrhoids presently   COLONOSCOPY  09/27/2011   MFM:[email or web address]  hemorrhoids. Hepatic flexure polyp-removed TUBULAR ADENOMA, due for surveillance March 2016   COLONOSCOPY N/A 01/20/2015   MFM:[email or web address] colonic polyp removed/colonic diverticulosis   COLONOSCOPY N/A 07/05/2020   single 3 mm polyp at the rectosigmoid colon and a 7 mm innocent appearing AVM at the base of the cecum, otherwise normal.  Surgical pathology found the polyp to be hyperplastic.  Recommended no future colonoscopy unless new symptoms develop.   CYSTOSCOPY W/ URETERAL STENT PLACEMENT Left 02/03/2013   Procedure: CYSTOSCOPY WITH RETROGRADE PYELOGRAM/URETERAL STENT PLACEMENT;  Surgeon: Emery LILLETTE Blaze, MD;  Location: [facility];  Service: Urology;  Laterality: Left;   ESOPHAGOGASTRODUODENOSCOPY  05/17/2005   normal esophagus/gastric mucosa appeared normal   ESOPHAGOGASTRODUODENOSCOPY N/A 04/25/2014   Abnormal gastric mucosa, benign biopsies. No esophageal varices. Next EGD October 2018.   ESOPHAGOGASTRODUODENOSCOPY (EGD) WITH PROPOFOL  N/A 01/08/2018   nonerosive reflux esophagitis status post Maloney dilation, portal hypertensive gastropathy, normal duodenum. Surveillance was due 2021   EXTRACORPOREAL SHOCK WAVE LITHOTRIPSY Left 02/03/2013   Procedure: EXTRACORPOREAL SHOCK WAVE LITHOTRIPSY (ESWL) LEFT RENAL CALCULUS;  Surgeon: Emery LILLETTE Blaze, MD;  Location: [facility];  Service: Urology;  Laterality: Left;   KIDNEY STONE SURGERY     MALONEY DILATION N/A 01/08/2018   Procedure: AGAPITO DILATION;  Surgeon: Shaaron Lamar HERO, MD;  Location: [facility];  Service: Endoscopy;  Laterality: N/A;   POLYPECTOMY  07/05/2020   Procedure: POLYPECTOMY;  Surgeon: Shaaron Lamar HERO, MD;  Location: [facility];  Service: Endoscopy;;  recto-sigmoid    SHOULDER SURGERY      Current Outpatient Medications  Medication Sig Dispense Refill   apixaban  (ELIQUIS ) 2.5 MG TABS tablet Take 2.5 mg by mouth 2 (two) times daily.     olmesartan (BENICAR) 40 MG tablet Take 20 mg by mouth daily.     Pancrelipase ,  Lip-Prot-Amyl, (ZENPEP ) 40000-126000 units CPEP Take 2 capsules (80,000 Units total) by mouth 3 (three) times daily with meals. 1 with snacks 240 capsule 3   pantoprazole  (PROTONIX ) 40 MG tablet Take 1 tablet by mouth once daily 90 tablet 3   potassium citrate (UROCIT-K) 10 MEQ (1080 MG) SR tablet Take 10 mEq by mouth 2 (two) times daily.      simethicone  (GAS-X) 80 MG chewable tablet Chew 1 tablet (80 mg total) by mouth every 6 (six) hours as needed for flatulence. 30 tablet 0   triamcinolone  (KENALOG ) 0.1 % Apply 1 application topically 2 (two) times daily. APPLY CREAM EXTERNALLY TWICE DAILY FOR UP TO 2 CONTINUOUS WEEKS THEN TAKE A 2 WEEK BREAK BEFORE RESTARTING.     TRIJARDY XR 5-2.11-998 MG TB24 Take 1 tablet by mouth 2 (two) times daily.     glimepiride (AMARYL) 4 MG tablet Take 4 mg by mouth 2 (two) times daily. (Patient not taking: Reported on 07/17/2023)     No current facility-administered medications for this visit.    Allergies as of 07/17/2023   (No Known Allergies)    Family History  Problem Relation Age of Onset   Liver disease Father        ? Hepatitis C; Alcoholic   Colon cancer Neg Hx     Social History   Socioeconomic History   Marital status: Married    Spouse name: Not on file   Number of children: Not on file   Years of education: Not on file   Highest education level: Not on file  Occupational History   Not on file  Tobacco Use   Smoking status: Former    Current packs/day: 0.00    Average packs/day: 1 pack/day for 25.0 years (25.0 ttl pk-yrs)    Types: Cigarettes    Start date: 08/01/1970    Quit date: 08/02/1995    Years since quitting: 27.9   Smokeless tobacco: Former    Quit date: 10/12/1992  Vaping Use   Vaping status: Never Used  Substance and Sexual Activity   Alcohol use: Not Currently    Alcohol/week: 0.0 standard drinks of alcohol    Comment: Occasionally; Average one drink every few months; Summer will occasionally drink a bottle of wine.    Drug use: No   Sexual activity: Yes    Birth control/protection: None  Other Topics Concern   Not on file  Social History Narrative   ** Merged History Encounter **       Social Drivers of Health   Financial Resource Strain: Not on file  Food Insecurity: No Food Insecurity (02/18/2022)   Hunger Vital Sign    Worried About Running Out of Food in the Last Year: Never true    Ran Out of Food in the Last Year: Never true  Transportation Needs: No Transportation Needs (02/18/2022)   PRAPARE - Administrator, Civil Service (Medical): No    Lack of Transportation (Non-Medical): No  Physical Activity: Not on file  Stress: Not on file  Social Connections: Unknown (11/26/2021)  Received from [facility], [facility]   Social Network    Social Network: Not on file  Intimate Partner Violence: Unknown (10/18/2021)   Received from [facility], [facility]   HITS    Physically Hurt: Not on file    Insult or Talk Down To: Not on file    Threaten Physical Harm: Not on file    Scream or Curse: Not on file     Review of Systems   Gen: Denies any fever, chills, fatigue, weight loss, lack of appetite.  CV: Denies chest pain, heart palpitations, peripheral edema, syncope.  Resp: Denies shortness of breath at rest or with exertion. Denies wheezing or cough.  GI: Denies dysphagia or odynophagia. Denies jaundice, hematemesis, fecal incontinence. GU : Denies urinary burning, urinary frequency, urinary hesitancy MS: Denies joint pain, muscle weakness, cramps, or limitation of movement.  Derm: Denies rash, itching, dry skin Psych: Denies depression, anxiety, memory loss, and confusion Heme: Denies bruising, bleeding, and enlarged lymph nodes.   Physical Exam   BP 108/75   Pulse 96   Ht 6' (1.829 m)   Wt 206 lb (93.4 kg)   BMI 27.94 kg/m  General:   Alert and oriented. Pleasant and cooperative. Well-nourished and well-developed.  Head:  Normocephalic and atraumatic. Eyes:   Without icterus Abdomen:  +BS, soft, non-tender and non-distended. No HSM noted. No guarding or rebound. No masses appreciated.  Rectal:  Deferred  Msk:  Symmetrical without gross deformities. Normal posture. Extremities:  Without edema. Neurologic:  Alert and  oriented x4;  grossly normal neurologically. Skin:  Intact without significant lesions or rashes. Psych:  Alert and cooperative. Normal mood and affect.  Outside labs via [facility] Dec 2024:  Hgb 17.7, Hct 53, platelets 128 Albumin 4.4, Tbili 0.7, Alk Phos 50, AST 26, ALT 34 Vit D low at 20.2   Assessment   KEASTON PILE is a 73 y.o. male presenting today with a history of Hep C s/p eradication and advanced fibrosis on elastography in the past, EPI, GERD, returning for follow-up today.   Advanced fibrosis: elastography findings improved in April 2022 but with past history of advanced fibrosis, need serial ultrasounds as at increased risk for [facility]. We will update US  now and perform every 6 months. As he has chronic thrombocytopenia and hx of portal gastropathy in 2019, will also update EGD.  He will need AFP updated now as well. Consider updating elastography at next US . Will continue to follow proactively.  Chronic diarrhea: responded remarkably to Zenpep . Suspect EPI.   Vit D deficiency: on weekly dosing per PCP.  GERD: Continue pantoprazole  daily.   PLAN    Proceed with upper endoscopy by Dr. Shaaron in near future: the risks, benefits, and alternatives have been discussed with the patient in detail. The patient states understanding and desires to proceed.  HOLD ELIQUIS  X 48 hours prior (history of PE in 2022. Stable) Continue Zenpep , taking 60k units with meals. Obtaining through patient assistance RUQ US  in near future. Can pursue elastography at next one in 6 months AFP Return in 6 months   Stephen MICAEL Stager, PhD, [facility] Gastroenterology

## 2023-07-18 ENCOUNTER — Telehealth: Payer: Self-pay

## 2023-07-18 ENCOUNTER — Encounter: Payer: Self-pay | Admitting: *Deleted

## 2023-07-18 NOTE — Telephone Encounter (Signed)
 Pt's patient assistance program forms (other documentation) was faxed to [facility] [employer] @ [phone].

## 2023-07-22 ENCOUNTER — Ambulatory Visit (HOSPITAL_COMMUNITY)
Admission: RE | Admit: 2023-07-22 | Discharge: 2023-07-22 | Disposition: A | Discharge status: Home / Self Care | Payer: PPO | Source: Ambulatory Visit | Attending: Gastroenterology | Admitting: Gastroenterology

## 2023-07-22 DIAGNOSIS — Z9049 Acquired absence of other specified parts of digestive tract: Secondary | ICD-10-CM | POA: Diagnosis not present

## 2023-07-22 DIAGNOSIS — Z8619 Personal history of other infectious and parasitic diseases: Secondary | ICD-10-CM | POA: Insufficient documentation

## 2023-07-22 DIAGNOSIS — K7400 Hepatic fibrosis, unspecified: Secondary | ICD-10-CM | POA: Insufficient documentation

## 2023-07-24 DIAGNOSIS — E1169 Type 2 diabetes mellitus with other specified complication: Secondary | ICD-10-CM | POA: Diagnosis not present

## 2023-07-24 DIAGNOSIS — E1129 Type 2 diabetes mellitus with other diabetic kidney complication: Secondary | ICD-10-CM | POA: Diagnosis not present

## 2023-07-24 DIAGNOSIS — E785 Hyperlipidemia, unspecified: Secondary | ICD-10-CM | POA: Diagnosis not present

## 2023-07-24 DIAGNOSIS — R809 Proteinuria, unspecified: Secondary | ICD-10-CM | POA: Diagnosis not present

## 2023-07-24 DIAGNOSIS — Z1330 Encounter for screening examination for mental health and behavioral disorders, unspecified: Secondary | ICD-10-CM | POA: Diagnosis not present

## 2023-07-24 DIAGNOSIS — E1159 Type 2 diabetes mellitus with other circulatory complications: Secondary | ICD-10-CM | POA: Diagnosis not present

## 2023-07-24 DIAGNOSIS — I152 Hypertension secondary to endocrine disorders: Secondary | ICD-10-CM | POA: Diagnosis not present

## 2023-08-01 ENCOUNTER — Telehealth: Payer: Self-pay

## 2023-08-01 NOTE — Telephone Encounter (Signed)
Phoned the pt and LMOVM for the pt to go back on his portal for [employer] to fill out in the insurance portion.

## 2023-08-05 ENCOUNTER — Telehealth: Payer: Self-pay

## 2023-08-05 NOTE — Telephone Encounter (Signed)
Re-scanned the pt's application to [employer] with insurance  information we had on file. Phoned and asked the pt did he give [place] a copy of his new card and he stated yes when he was last seen (up the street) but it was not in the office so I just sent card from last that we have on file

## 2023-08-06 ENCOUNTER — Encounter: Payer: Self-pay | Admitting: Gastroenterology

## 2023-08-13 DIAGNOSIS — Z8619 Personal history of other infectious and parasitic diseases: Secondary | ICD-10-CM | POA: Diagnosis not present

## 2023-08-13 DIAGNOSIS — K7400 Hepatic fibrosis, unspecified: Secondary | ICD-10-CM | POA: Diagnosis not present

## 2023-08-14 LAB — AFP TUMOR MARKER: AFP, Serum, Tumor Marker: 4.2 ng/mL (ref 0.0–8.4)

## 2023-08-28 NOTE — Patient Instructions (Signed)
Stephen Stephenson  08/28/2023     @PREFPERIOPPHARMACY @   Your procedure is scheduled on  09/03/2023.   [facility] to [facility] at  0845 A.M.   Call this number if you have problems the morning of surgery:  [phone]  If you experience any cold or flu symptoms such as cough, fever, chills, shortness of breath, etc. between now and your scheduled surgery, please notify us at the above number.   Remember:       Your last dose of eliquis should be on 08/31/2023.         Your last dose of trijardy should be on 09/01/2023.        DO NOT take any medications for diabetes the morning of your procedure.   Follow the diet instructions given to you by the office.    You may drink clear liquids until  0645  am on 09/03/2023.    Clear liquids allowed are:                    Water, Juice (No red color; non-citric and without pulp; diabetics please choose diet or no sugar options), Carbonated beverages (diabetics please choose diet or no sugar options), Clear Tea (No creamer, milk, or cream, including half & half and powdered creamer), Black Coffee Only (No creamer, milk or cream, including half & half and powdered creamer), and Clear Sports drink (No red color; diabetics please choose diet or no sugar options)    Take these medicines the morning of surgery with A SIP OF WATER                                            pantoprazole.    Do not wear jewelry, make-up or nail polish, including gel polish,  artificial nails, or any other type of covering on natural nails (fingers and  toes).  Do not wear lotions, powders, or perfumes, or deodorant.  Do not shave 48 hours prior to surgery.  Men may shave face and neck.  Do not bring valuables to the hospital.  [facility] is not responsible for any belongings or valuables.  Contacts, dentures or bridgework may not be worn into surgery.  Leave your suitcase in the car.  After surgery it may be brought to your room.  For patients  admitted to the hospital, discharge time will be determined by your treatment team.  Patients discharged the day of surgery will not be allowed to drive home and must have someone with them for 24 hours.    Special instructions:   DO NOT smoke tobacco or vape for 24 hours before your procedure.  Please read over the following fact sheets that you were given. Anesthesia Post-op Instructions and Care and Recovery After Surgery      Upper Endoscopy, Adult, Care After After the procedure, it is common to have a sore throat. It is also common to have: Mild stomach pain or discomfort. Bloating. Nausea. Follow these instructions at home: The instructions below may help you care for yourself at home. Your health care provider may give you more instructions. If you have questions, ask your health care provider. If you were given a sedative during the procedure, it can affect you for several hours. Do not drive or operate machinery until your health care provider says that  it is safe. If you will be going home right after the procedure, plan to have a responsible adult: Take you home from the hospital or clinic. You will not be allowed to drive. Care for you for the time you are told. Follow instructions from your health care provider about what you may eat and drink. Return to your normal activities as told by your health care provider. Ask your health care provider what activities are safe for you. Take over-the-counter and prescription medicines only as told by your health care provider. Contact a health care provider if you: Have a sore throat that lasts longer than one day. Have trouble swallowing. Have a fever. Get help right away if you: Vomit blood or your vomit looks like coffee grounds. Have bloody, black, or tarry stools. Have a very bad sore throat or you cannot swallow. Have difficulty breathing or very bad pain in your chest or abdomen. These symptoms may be an emergency. Get  help right away. Call 911. Do not wait to see if the symptoms will go away. Do not drive yourself to the hospital. Summary After the procedure, it is common to have a sore throat, mild stomach discomfort, bloating, and nausea. If you were given a sedative during the procedure, it can affect you for several hours. Do not drive until your health care provider says that it is safe. Follow instructions from your health care provider about what you may eat and drink. Return to your normal activities as told by your health care provider. This information is not intended to replace advice given to you by your health care provider. Make sure you discuss any questions you have with your health care provider. Document Revised: 10/10/2021 Document Reviewed: 10/10/2021 Elsevier Patient Education  2024 Elsevier Inc.Monitored Anesthesia Care, Care After The following information offers guidance on how to care for yourself after your procedure. Your health care provider may also give you more specific instructions. If you have problems or questions, contact your health care provider. What can I expect after the procedure? After the procedure, it is common to have: Tiredness. Little or no memory about what happened during or after the procedure. Impaired judgment when it comes to making decisions. Nausea or vomiting. Some trouble with balance. Follow these instructions at home: For the time period you were told by your health care provider:  Rest. Do not participate in activities where you could fall or become injured. Do not drive or use machinery. Do not drink alcohol. Do not take sleeping pills or medicines that cause drowsiness. Do not make important decisions or sign legal documents. Do not take care of children on your own. Medicines Take over-the-counter and prescription medicines only as told by your health care provider. If you were prescribed antibiotics, take them as told by your health  care provider. Do not stop using the antibiotic even if you start to feel better. Eating and drinking Follow instructions from your health care provider about what you may eat and drink. Drink enough fluid to keep your urine pale yellow. If you vomit: Drink clear fluids slowly and in small amounts as you are able. Clear fluids include water, ice chips, low-calorie sports drinks, and fruit juice that has water added to it (diluted fruit juice). Eat light and bland foods in small amounts as you are able. These foods include bananas, applesauce, rice, lean meats, toast, and crackers. General instructions  Have a responsible adult stay with you for the time you are told.  It is important to have someone help care for you until you are awake and alert. If you have sleep apnea, surgery and some medicines can increase your risk for breathing problems. Follow instructions from your health care provider about wearing your sleep device: When you are sleeping. This includes during daytime naps. While taking prescription pain medicines, sleeping medicines, or medicines that make you drowsy. Do not use any products that contain nicotine or tobacco. These products include cigarettes, chewing tobacco, and vaping devices, such as e-cigarettes. If you need help quitting, ask your health care provider. Contact a health care provider if: You feel nauseous or vomit every time you eat or drink. You feel light-headed. You are still sleepy or having trouble with balance after 24 hours. You get a rash. You have a fever. You have redness or swelling around the IV site. Get help right away if: You have trouble breathing. You have new confusion after you get home. These symptoms may be an emergency. Get help right away. Call 911. Do not wait to see if the symptoms will go away. Do not drive yourself to the hospital. This information is not intended to replace advice given to you by your health care provider. Make  sure you discuss any questions you have with your health care provider. Document Revised: 11/26/2021 Document Reviewed: 11/26/2021 Elsevier Patient Education  2024 [employer].

## 2023-09-01 ENCOUNTER — Encounter (HOSPITAL_COMMUNITY)
Admission: RE | Admit: 2023-09-01 | Discharge: 2023-09-01 | Disposition: A | Discharge status: Home / Self Care | Payer: PPO | Source: Ambulatory Visit | Attending: Internal Medicine | Admitting: Internal Medicine

## 2023-09-01 ENCOUNTER — Telehealth: Payer: Self-pay | Admitting: *Deleted

## 2023-09-01 DIAGNOSIS — E119 Type 2 diabetes mellitus without complications: Secondary | ICD-10-CM

## 2023-09-01 DIAGNOSIS — K7460 Unspecified cirrhosis of liver: Secondary | ICD-10-CM

## 2023-09-01 DIAGNOSIS — I10 Essential (primary) hypertension: Secondary | ICD-10-CM

## 2023-09-01 NOTE — Telephone Encounter (Signed)
Spoke with Clydie Braun at [place] and when the pt went on the portal he didn't fill out the correct insurance information or the co-pay he was being charged at the pharmacy. I advised the rep that I will call the pt and get the information.   Phoned and spoke to the pt and was advised that his insurance was still the same from last year and he stated he didn't remember the co-pay but it was between $600 to $700.00 advised the pt I would contact him back once I heard more. Pt expressed understanding   Phoned back to [facility] [phone] and spoke with Clydie Braun. Advised of the retrieved information from the pt and she stated she will use $650.00 and we should get a fax later today regarding the decision that was made. Understanding was expressed by me.

## 2023-09-01 NOTE — Telephone Encounter (Signed)
Patient missed his pre-op appt.  Stephen Stephenson r/s'd to 10 tomorrow. Pt aware  Also pt reports he has not heard anything about his patient assistance. Please advise thanks

## 2023-09-01 NOTE — Telephone Encounter (Signed)
Phoned the [place] company was on hold for 10 minutes. LM on their vm to return my call to desk number. Will call pt after I hear from the company.

## 2023-09-01 NOTE — Telephone Encounter (Signed)
Documentation from [employer] approving pt through 07/14/2024. Given to pt to scan to chart and the pt has been advised.

## 2023-09-02 ENCOUNTER — Telehealth: Payer: Self-pay | Admitting: *Deleted

## 2023-09-02 ENCOUNTER — Encounter (HOSPITAL_COMMUNITY)
Admission: RE | Admit: 2023-09-02 | Discharge: 2023-09-02 | Disposition: A | Discharge status: Home / Self Care | Payer: PPO | Source: Ambulatory Visit | Attending: Internal Medicine | Admitting: Internal Medicine

## 2023-09-02 DIAGNOSIS — E119 Type 2 diabetes mellitus without complications: Secondary | ICD-10-CM | POA: Diagnosis not present

## 2023-09-02 DIAGNOSIS — Z01818 Encounter for other preprocedural examination: Secondary | ICD-10-CM | POA: Insufficient documentation

## 2023-09-02 DIAGNOSIS — K7460 Unspecified cirrhosis of liver: Secondary | ICD-10-CM | POA: Diagnosis not present

## 2023-09-02 DIAGNOSIS — I10 Essential (primary) hypertension: Secondary | ICD-10-CM | POA: Insufficient documentation

## 2023-09-02 LAB — CBC WITH DIFFERENTIAL/PLATELET
Abs Immature Granulocytes: 0.03 10*3/uL (ref 0.00–0.07)
Basophils Absolute: 0 10*3/uL (ref 0.0–0.1)
Basophils Relative: 1 %
Eosinophils Absolute: 0.2 10*3/uL (ref 0.0–0.5)
Eosinophils Relative: 3 %
HCT: 49.9 % (ref 39.0–52.0)
Hemoglobin: 17.2 g/dL — ABNORMAL HIGH (ref 13.0–17.0)
Immature Granulocytes: 1 %
Lymphocytes Relative: 28 %
Lymphs Abs: 1.7 10*3/uL (ref 0.7–4.0)
MCH: 30.8 pg (ref 26.0–34.0)
MCHC: 34.5 g/dL (ref 30.0–36.0)
MCV: 89.3 fL (ref 80.0–100.0)
Monocytes Absolute: 0.5 10*3/uL (ref 0.1–1.0)
Monocytes Relative: 8 %
Neutro Abs: 3.6 10*3/uL (ref 1.7–7.7)
Neutrophils Relative %: 59 %
Platelets: 127 10*3/uL — ABNORMAL LOW (ref 150–400)
RBC: 5.59 MIL/uL (ref 4.22–5.81)
RDW: 13.2 % (ref 11.5–15.5)
WBC: 6 10*3/uL (ref 4.0–10.5)
nRBC: 0 % (ref 0.0–0.2)

## 2023-09-02 LAB — COMPREHENSIVE METABOLIC PANEL
ALT: 32 U/L (ref 0–44)
AST: 23 U/L (ref 15–41)
Albumin: 3.9 g/dL (ref 3.5–5.0)
Alkaline Phosphatase: 35 U/L — ABNORMAL LOW (ref 38–126)
Anion gap: 13 (ref 5–15)
BUN: 25 mg/dL — ABNORMAL HIGH (ref 8–23)
CO2: 22 mmol/L (ref 22–32)
Calcium: 9.8 mg/dL (ref 8.9–10.3)
Chloride: 104 mmol/L (ref 98–111)
Creatinine, Ser: 1.23 mg/dL (ref 0.61–1.24)
GFR, Estimated: >60 mL/min (ref >60)
Glucose, Bld: 120 mg/dL — ABNORMAL HIGH (ref 70–99)
Potassium: 4.2 mmol/L (ref 3.5–5.1)
Sodium: 139 mmol/L (ref 135–145)
Total Bilirubin: 1 mg/dL (ref 0.0–1.2)
Total Protein: 7.3 g/dL (ref 6.5–8.1)

## 2023-09-02 LAB — PROTIME-INR
INR: 1.1 (ref 0.8–1.2)
Prothrombin Time: 14.2 s (ref 11.4–15.2)

## 2023-09-02 NOTE — Telephone Encounter (Signed)
Per endo "Stephen Stephenson came in for his PAT visit this a.m.  He did not stop his Eliquis or his Trijardy on the recommended dates.  Dr. Johnnette Litter notified and will cancel this case.  Please reschedule.  Thanks!"  Called pt. He has been moved to 3/12. Aware will send him new instructions and let him know about pre-op appointment.

## 2023-09-02 NOTE — Progress Notes (Signed)
Patient came in for his PAT visit this morning.  When doing his review, he stated that he took his Eliquis and his Trijardy this morning at 0700.  Spoke with Dr. Hyacinth Meeker; procedure will be rescheduled.  Notified the office and Eber Jones to reschedule.

## 2023-09-19 NOTE — Telephone Encounter (Signed)
 Spoke with the pt and advised that his medication Zenpep 60,000 was here, Qty 4 bottles. Pt has been approved for one year through [place]. Pt will be in sometime Monday to pick it up.

## 2023-09-22 ENCOUNTER — Encounter (HOSPITAL_COMMUNITY): Payer: Self-pay

## 2023-09-22 ENCOUNTER — Encounter (HOSPITAL_COMMUNITY)
Admission: RE | Admit: 2023-09-22 | Discharge: 2023-09-22 | Disposition: A | Discharge status: Home / Self Care | Payer: PPO | Source: Ambulatory Visit | Attending: Internal Medicine | Admitting: Internal Medicine

## 2023-09-22 HISTORY — DX: Essential (primary) hypertension: I10

## 2023-09-22 HISTORY — DX: Personal history of urinary calculi: Z87.442

## 2023-09-22 NOTE — Telephone Encounter (Signed)
 FYI:  Pt picked up his Zenpep and signed documentation. Given to [facility] for scan to chart.

## 2023-09-24 ENCOUNTER — Encounter (HOSPITAL_COMMUNITY): Payer: Self-pay | Admitting: Internal Medicine

## 2023-09-24 ENCOUNTER — Ambulatory Visit (HOSPITAL_COMMUNITY): Admitting: Anesthesiology

## 2023-09-24 ENCOUNTER — Ambulatory Visit (HOSPITAL_COMMUNITY)
Admission: RE | Admit: 2023-09-24 | Discharge: 2023-09-24 | Disposition: A | Discharge status: Home / Self Care | Payer: PPO | Attending: Internal Medicine | Admitting: Internal Medicine

## 2023-09-24 ENCOUNTER — Other Ambulatory Visit: Payer: Self-pay

## 2023-09-24 ENCOUNTER — Encounter (HOSPITAL_COMMUNITY)
Admission: RE | Disposition: A | Discharge status: Home / Self Care | Payer: Self-pay | Source: Home / Self Care | Attending: Internal Medicine

## 2023-09-24 DIAGNOSIS — Z8619 Personal history of other infectious and parasitic diseases: Secondary | ICD-10-CM | POA: Diagnosis not present

## 2023-09-24 DIAGNOSIS — K7460 Unspecified cirrhosis of liver: Secondary | ICD-10-CM | POA: Insufficient documentation

## 2023-09-24 DIAGNOSIS — K766 Portal hypertension: Secondary | ICD-10-CM | POA: Insufficient documentation

## 2023-09-24 DIAGNOSIS — I10 Essential (primary) hypertension: Secondary | ICD-10-CM

## 2023-09-24 DIAGNOSIS — Z86711 Personal history of pulmonary embolism: Secondary | ICD-10-CM | POA: Insufficient documentation

## 2023-09-24 DIAGNOSIS — K219 Gastro-esophageal reflux disease without esophagitis: Secondary | ICD-10-CM | POA: Insufficient documentation

## 2023-09-24 DIAGNOSIS — E119 Type 2 diabetes mellitus without complications: Secondary | ICD-10-CM

## 2023-09-24 DIAGNOSIS — K3189 Other diseases of stomach and duodenum: Secondary | ICD-10-CM | POA: Insufficient documentation

## 2023-09-24 DIAGNOSIS — Z87891 Personal history of nicotine dependence: Secondary | ICD-10-CM

## 2023-09-24 DIAGNOSIS — Z7901 Long term (current) use of anticoagulants: Secondary | ICD-10-CM | POA: Diagnosis not present

## 2023-09-24 DIAGNOSIS — Z7984 Long term (current) use of oral hypoglycemic drugs: Secondary | ICD-10-CM | POA: Diagnosis not present

## 2023-09-24 DIAGNOSIS — Z79899 Other long term (current) drug therapy: Secondary | ICD-10-CM | POA: Insufficient documentation

## 2023-09-24 DIAGNOSIS — Z860100 Personal history of colon polyps, unspecified: Secondary | ICD-10-CM | POA: Insufficient documentation

## 2023-09-24 HISTORY — PX: ESOPHAGOGASTRODUODENOSCOPY (EGD) WITH PROPOFOL: SHX5813

## 2023-09-24 LAB — GLUCOSE, CAPILLARY: Glucose-Capillary: 143 mg/dL — ABNORMAL HIGH (ref 70–99)

## 2023-09-24 SURGERY — ESOPHAGOGASTRODUODENOSCOPY (EGD) WITH PROPOFOL
Anesthesia: General

## 2023-09-24 MED ORDER — LACTATED RINGERS IV SOLN: INTRAVENOUS | Status: DC

## 2023-09-24 MED ORDER — LIDOCAINE HCL (PF) 2 % IJ SOLN
INTRAMUSCULAR | Status: DC | PRN
  Administered 2023-09-24: 100 mg via INTRADERMAL

## 2023-09-24 MED ORDER — PROPOFOL 10 MG/ML IV BOLUS
INTRAVENOUS | Status: DC | PRN
  Administered 2023-09-24: 50 mg via INTRAVENOUS
  Administered 2023-09-24: 100 mg via INTRAVENOUS
  Administered 2023-09-24: 50 mg via INTRAVENOUS

## 2023-09-24 NOTE — Discharge Instructions (Signed)
 EGD Discharge instructions Please read the instructions outlined below and refer to this sheet in the next few weeks. These discharge instructions provide you with general information on caring for yourself after you leave the hospital. Your doctor may also give you specific instructions. While your treatment has been planned according to the most current medical practices available, unavoidable complications occasionally occur. If you have any problems or questions after discharge, please call your doctor. ACTIVITY You may resume your regular activity but move at a slower pace for the next 24 hours.  Take frequent rest periods for the next 24 hours.  Walking will help expel (get rid of) the air and reduce the bloated feeling in your abdomen.  No driving for 24 hours (because of the anesthesia (medicine) used during the test).  You may shower.  Do not sign any important legal documents or operate any machinery for 24 hours (because of the anesthesia used during the test).  NUTRITION Drink plenty of fluids.  You may resume your normal diet.  Begin with a light meal and progress to your normal diet.  Avoid alcoholic beverages for 24 hours or as instructed by your caregiver.  MEDICATIONS You may resume your normal medications unless your caregiver tells you otherwise.  WHAT YOU CAN EXPECT TODAY You may experience abdominal discomfort such as a feeling of fullness or "gas" pains.  FOLLOW-UP Your doctor will discuss the results of your test with you.  SEEK IMMEDIATE MEDICAL ATTENTION IF ANY OF THE FOLLOWING OCCUR: Excessive nausea (feeling sick to your stomach) and/or vomiting.  Severe abdominal pain and distention (swelling).  Trouble swallowing.  Temperature over 101 F (37.8 C).  Rectal bleeding or vomiting of blood.     No esophageal varices.  Mild changes consistent with liver disease involving your stomach lining (portal gastropathy).  No ulcer nothing to explain your recent GI  symptoms.  Timing of a future EGD to be determined  Continue present medications  Office visit with Lewie Loron in about 8 weeks if not already scheduled  At patient request, I called wild at [phone] -discussed findings and recommendations

## 2023-09-24 NOTE — Op Note (Signed)
 [facility] Patient Name: Stephen Stephenson Procedure Date: 09/24/2023 8:08 AM MRN: [id number] Date of Birth: 1951-07-10 Attending MD: Gennette Pac , MD, [id number] CSN: [id number] Age: 73 Admit Type: Outpatient Procedure:                Upper GI endoscopy Indications:              Screening procedure, Cirrhosis rule out esophageal                            varices Providers:                Gennette Pac, MD, Nena Polio, RN, Lennice Sites Technician, Technician Referring MD:              Medicines:                Propofol per Anesthesia Complications:            No immediate complications. Estimated Blood Loss:     Estimated blood loss: none. Procedure:                Pre-Anesthesia Assessment:                           - Prior to the procedure, a History and Physical                            was performed, and patient medications and                            allergies were reviewed. The patient's tolerance of                            previous anesthesia was also reviewed. The risks                            and benefits of the procedure and the sedation                            options and risks were discussed with the patient.                            All questions were answered, and informed consent                            was obtained. Prior Anticoagulants: The patient has                            taken no anticoagulant or antiplatelet agents. ASA                            Grade Assessment: III - A patient with severe  systemic disease. After reviewing the risks and                            benefits, the patient was deemed in satisfactory                            condition to undergo the procedure.                           After obtaining informed consent, the endoscope was                            passed under direct vision. Throughout the                            procedure, the  patient's blood pressure, pulse, and                            oxygen saturations were monitored continuously. The                            GIF-H190 ([id number]) scope was introduced through the                            mouth, and advanced to the second part of duodenum.                            The upper GI endoscopy was accomplished without                            difficulty. The patient tolerated the procedure                            well. Scope In: 8:43:19 AM Scope Out: 8:47:11 AM Total Procedure Duration: 0 hours 3 minutes 52 seconds  Findings:      The examined esophagus was normal.      Mild portal hypertensive gastropathy was found in the entire examined       stomach. No other abnormalities. Pylorus patent.      The duodenal bulb and second portion of the duodenum were normal. Impression:               - Normal esophagus.                           - Portal hypertensive gastropathy.                           - Normal duodenal bulb and second portion of the                            duodenum.                           - No specimens collected. Moderate Sedation:      Moderate (conscious) sedation was personally administered by an  anesthesia professional. The following parameters were monitored: oxygen       saturation, heart rate, blood pressure, respiratory rate, EKG, adequacy       of pulmonary ventilation, and response to care. Recommendation:           - Patient has a contact number available for                            emergencies. The signs and symptoms of potential                            delayed complications were discussed with the                            patient. Return to normal activities tomorrow.                            Written discharge instructions were provided to the                            patient.                           - Advance diet as tolerated.                           - Continue present medications. Office visit in 8                             weeks. Timing of future EGD to be determined. Procedure Code(s):        --- Professional ---                           [phone], Esophagogastroduodenoscopy, flexible,                            transoral; diagnostic, including collection of                            specimen(s) by brushing or washing, when performed                            (separate procedure) Diagnosis Code(s):        --- Professional ---                           K76.6, Portal hypertension                           K31.89, Other diseases of stomach and duodenum                           Z13.810, Encounter for screening for upper                            gastrointestinal disorder  K74.60, Unspecified cirrhosis of liver CPT copyright 2022 [employer]. All rights reserved. The codes documented in this report are preliminary and upon coder review may  be revised to meet current compliance requirements. Gerrit Friends. Arlee Santosuosso, MD Gennette Pac, MD 09/24/2023 8:57:31 AM This report has been signed electronically. Number of Addenda: 0

## 2023-09-24 NOTE — Anesthesia Preprocedure Evaluation (Signed)
 Anesthesia Evaluation  Patient identified by MRN, date of birth, ID band Patient awake    Reviewed: Allergy & Precautions, H&P , NPO status , Patient's Chart, lab work & pertinent test results, reviewed documented beta blocker date and time   Airway Mallampati: II  TM Distance: >3 FB Neck ROM: full    Dental no notable dental hx.    Pulmonary neg pulmonary ROS, former smoker   Pulmonary exam normal breath sounds clear to auscultation       Cardiovascular Exercise Tolerance: Good hypertension,  Rhythm:regular Rate:Normal     Neuro/Psych negative neurological ROS  negative psych ROS   GI/Hepatic ,GERD  ,,(+) Hepatitis -  Endo/Other  diabetes    Renal/GU Renal disease  negative genitourinary   Musculoskeletal   Abdominal   Peds  Hematology negative hematology ROS (+)   Anesthesia Other Findings   Reproductive/Obstetrics negative OB ROS                             Anesthesia Physical Anesthesia Plan  ASA: 3  Anesthesia Plan: General   Post-op Pain Management:    Induction:   PONV Risk Score and Plan: Propofol infusion  Airway Management Planned:   Additional Equipment:   Intra-op Plan:   Post-operative Plan:   Informed Consent: I have reviewed the patients History and Physical, chart, labs and discussed the procedure including the risks, benefits and alternatives for the proposed anesthesia with the patient or authorized representative who has indicated his/her understanding and acceptance.     Dental Advisory Given  Plan Discussed with: CRNA  Anesthesia Plan Comments:        Anesthesia Quick Evaluation

## 2023-09-24 NOTE — H&P (Signed)
 @LOGO @   Primary Care Physician:  Assunta Found, MD Primary Gastroenterologist:  Dr.   Pre-Procedure History & Physical: HPI:  Stephen Stephenson is a 73 y.o. male here for   Past Medical History:  Diagnosis Date   DM (diabetes mellitus) (HCC)    type II   GERD (gastroesophageal reflux disease)    Hb-AS genotype (HCC)    Hepatitis C    genotype 1 eradicated   History of kidney stones    History of pulmonary embolus (PE) 2022   Hypertension    IBS (irritable bowel syndrome)    Insomnia    NASH (nonalcoholic steatohepatitis)    Metavir F3, 10/2013, patient reports completing hepatitis a and B vaccination. 2016    Past Surgical History:  Procedure Laterality Date   BACK SURGERY     CHOLECYSTECTOMY     COLONOSCOPY  02/05/2002   anal canal herrhoids ow normal/normal ileum/suspect pt bled from hemorrhoids presently   COLONOSCOPY  09/27/2011   EAV:[id number] hemorrhoids. Hepatic flexure polyp-removed TUBULAR ADENOMA, due for surveillance March 2016   COLONOSCOPY N/A 01/20/2015   YNW:GNFAOZ colonic polyp removed/colonic diverticulosis   COLONOSCOPY N/A 07/05/2020   single 3 mm polyp at the rectosigmoid colon and a 7 mm innocent appearing AVM at the base of the cecum, otherwise normal.  Surgical pathology found the polyp to be hyperplastic.  Recommended no future colonoscopy unless new symptoms develop.   CYSTOSCOPY W/ URETERAL STENT PLACEMENT Left 02/03/2013   Procedure: CYSTOSCOPY WITH RETROGRADE PYELOGRAM/URETERAL STENT PLACEMENT;  Surgeon: Ky Barban, MD;  Location: [facility];  Service: Urology;  Laterality: Left;   ESOPHAGOGASTRODUODENOSCOPY  05/17/2005   normal esophagus/gastric mucosa appeared normal   ESOPHAGOGASTRODUODENOSCOPY N/A 04/25/2014   Abnormal gastric mucosa, benign biopsies. No esophageal varices. Next EGD October 2018.   ESOPHAGOGASTRODUODENOSCOPY (EGD) WITH PROPOFOL N/A 01/08/2018   nonerosive reflux esophagitis status post Maloney dilation, portal hypertensive  gastropathy, normal duodenum. Surveillance was due 2021   EXTRACORPOREAL SHOCK WAVE LITHOTRIPSY Left 02/03/2013   Procedure: EXTRACORPOREAL SHOCK WAVE LITHOTRIPSY (ESWL) LEFT RENAL CALCULUS;  Surgeon: Ky Barban, MD;  Location: [facility];  Service: Urology;  Laterality: Left;   KIDNEY STONE SURGERY     MALONEY DILATION N/A 01/08/2018   Procedure: Elease Hashimoto DILATION;  Surgeon: Corbin Ade, MD;  Location: [facility];  Service: Endoscopy;  Laterality: N/A;   POLYPECTOMY  07/05/2020   Procedure: POLYPECTOMY;  Surgeon: Corbin Ade, MD;  Location: [facility];  Service: Endoscopy;;  recto-sigmoid    SHOULDER SURGERY      Prior to Admission medications   Medication Sig Start Date End Date Taking? Authorizing Provider  apixaban (ELIQUIS) 2.5 MG TABS tablet Take 2.5 mg by mouth 2 (two) times daily. 06/03/21  Yes [provider]  olmesartan (BENICAR) 40 MG tablet Take 20 mg by mouth daily. 08/23/20  Yes [provider]  Pancrelipase, Lip-Prot-Amyl, (ZENPEP) 30865-784696 units CPEP Take by mouth. Takes 2 with meals. Gets#360 for 90 days. Gets through patient assistance.   Yes [provider]  pantoprazole (PROTONIX) 40 MG tablet Take 1 tablet by mouth once daily 03/26/22  Yes Gelene Mink, NP  potassium citrate (UROCIT-K) 10 MEQ (1080 MG) SR tablet Take 10 mEq by mouth 2 (two) times daily.    Yes [provider]  simethicone (GAS-X) 80 MG chewable tablet Chew 1 tablet (80 mg total) by mouth every 6 (six) hours as needed for flatulence. 04/16/22  Yes Lonell Grandchild, MD  Lauraine Rinne  XR 5-2.11-998 MG TB24 Take 1 tablet by mouth 2 (two) times daily. 03/25/22  Yes [provider]  triamcinolone (KENALOG) 0.1 % Apply 1 application topically 2 (two) times daily. APPLY CREAM EXTERNALLY TWICE DAILY FOR UP TO 2 CONTINUOUS WEEKS THEN TAKE A 2 WEEK BREAK BEFORE RESTARTING. 08/28/20   [provider]    Allergies as of 07/17/2023   (No Known  Allergies)    Family History  Problem Relation Age of Onset   Liver disease Father        ? Hepatitis C; Alcoholic   Colon cancer Neg Hx     Social History   Socioeconomic History   Marital status: Married    Spouse name: Not on file   Number of children: Not on file   Years of education: Not on file   Highest education level: Not on file  Occupational History   Not on file  Tobacco Use   Smoking status: Former    Current packs/day: 0.00    Average packs/day: 1 pack/day for 25.0 years (25.0 ttl pk-yrs)    Types: Cigarettes    Start date: 08/01/1970    Quit date: 08/02/1995    Years since quitting: 28.1   Smokeless tobacco: Former    Quit date: 10/12/1992  Vaping Use   Vaping status: Never Used  Substance and Sexual Activity   Alcohol use: Not Currently    Alcohol/week: 0.0 standard drinks of alcohol    Comment: Occasionally; Average one drink every few months; Summer will occasionally drink a bottle of wine.   Drug use: No   Sexual activity: Yes    Birth control/protection: None  Other Topics Concern   Not on file  Social History Narrative   ** Merged History Encounter **       Social Drivers of Health   Financial Resource Strain: Not on file  Food Insecurity: No Food Insecurity (02/18/2022)   Hunger Vital Sign    Worried About Running Out of Food in the Last Year: Never true    Ran Out of Food in the Last Year: Never true  Transportation Needs: No Transportation Needs (02/18/2022)   PRAPARE - Administrator, Civil Service (Medical): No    Lack of Transportation (Non-Medical): No  Physical Activity: Not on file  Stress: Not on file  Social Connections: Unknown (11/26/2021)   Received from [facility]   Social Network    Social Network: Not on file  Intimate Partner Violence: Unknown (10/18/2021)   Received from [facility], [facility]   HITS    Physically Hurt: Not on file    Insult or Talk Down To: Not on file    Threaten  Physical Harm: Not on file    Scream or Curse: Not on file    Review of Systems: See HPI, otherwise negative ROS  Physical Exam: BP 125/85   Pulse (!) 103   Temp (!) 97.4 F (36.3 C) (Oral)   Resp 13   Ht 6' (1.829 m)   Wt 93.4 kg   SpO2 93%   BMI 27.94 kg/m  General:   Alert,  Well-developed, well-nourished, pleasant and cooperative in NAD Neck:  Supple; no masses or thyromegaly. No significant cervical adenopathy. Lungs:  Clear throughout to auscultation.   No wheezes, crackles, or rhonchi. No acute distress. Heart:  Regular rate and rhythm; no murmurs, clicks, rubs,  or gallops. Abdomen: Non-distended, normal bowel sounds.  Soft and nontender without appreciable mass  or hepatosplenomegaly.   Impression/Plan: 73 year old gentleman here for variceal screening.  No dysphagia. The risks, benefits, limitations, alternatives and imponderables have been reviewed with the patient. Potential for esophageal dilation, biopsy, etc. have also been reviewed.  Questions have been answered. All parties agreeable.      Notice: This dictation was prepared with Dragon dictation along with smaller phrase technology. Any transcriptional errors that result from this process are unintentional and may not be corrected upon review.

## 2023-09-24 NOTE — Transfer of Care (Signed)
 Immediate Anesthesia Transfer of Care Note  Patient: Stephen Stephenson  Procedure(s) Performed: ESOPHAGOGASTRODUODENOSCOPY (EGD) WITH PROPOFOL  Patient Location: Endoscopy Unit  Anesthesia Type:General  Level of Consciousness: drowsy  Airway & Oxygen Therapy: Patient Spontanous Breathing  Post-op Assessment: Report given to RN and Post -op Vital signs reviewed and stable  Post vital signs: Reviewed and stable  Last Vitals:  Vitals Value Taken Time  BP 86/53 09/24/23 0854  Temp 36.2 C 09/24/23 0854  Pulse 82 09/24/23 0854  Resp 17 09/24/23 0854  SpO2 94 % 09/24/23 0854    Last Pain:  Vitals:   09/24/23 0854  TempSrc: Axillary  PainSc: Asleep         Complications: No notable events documented.

## 2023-09-25 ENCOUNTER — Encounter (HOSPITAL_COMMUNITY): Payer: Self-pay | Admitting: Internal Medicine

## 2023-09-26 NOTE — Anesthesia Postprocedure Evaluation (Signed)
 Anesthesia Post Note  Patient: Stephen Stephenson  Procedure(s) Performed: ESOPHAGOGASTRODUODENOSCOPY (EGD) WITH PROPOFOL  Patient location during evaluation: Phase II Anesthesia Type: General Level of consciousness: awake Pain management: pain level controlled Vital Signs Assessment: post-procedure vital signs reviewed and stable Respiratory status: spontaneous breathing and respiratory function stable Cardiovascular status: blood pressure returned to baseline and stable Postop Assessment: no headache and no apparent nausea or vomiting Anesthetic complications: no Comments: Late entry   No notable events documented.   Last Vitals:  Vitals:   09/24/23 0854 09/24/23 0859  BP: (!) 86/53 103/67  Pulse: 82 87  Resp: 17 18  Temp: (!) 36.2 C   SpO2: 94% 95%    Last Pain:  Vitals:   09/25/23 1259  TempSrc:   PainSc: 0-No pain                 Windell Norfolk

## 2023-10-06 ENCOUNTER — Other Ambulatory Visit: Payer: Self-pay

## 2023-10-06 ENCOUNTER — Encounter (HOSPITAL_COMMUNITY): Payer: Self-pay

## 2023-10-06 ENCOUNTER — Emergency Department (HOSPITAL_COMMUNITY)

## 2023-10-06 ENCOUNTER — Emergency Department (HOSPITAL_COMMUNITY)
Admission: EM | Admit: 2023-10-06 | Discharge: 2023-10-06 | Disposition: A | Discharge status: Home / Self Care | Attending: Emergency Medicine | Admitting: Emergency Medicine

## 2023-10-06 DIAGNOSIS — R109 Unspecified abdominal pain: Secondary | ICD-10-CM | POA: Diagnosis present

## 2023-10-06 DIAGNOSIS — N400 Enlarged prostate without lower urinary tract symptoms: Secondary | ICD-10-CM | POA: Diagnosis not present

## 2023-10-06 DIAGNOSIS — N200 Calculus of kidney: Secondary | ICD-10-CM

## 2023-10-06 DIAGNOSIS — N132 Hydronephrosis with renal and ureteral calculous obstruction: Secondary | ICD-10-CM | POA: Insufficient documentation

## 2023-10-06 DIAGNOSIS — Z7901 Long term (current) use of anticoagulants: Secondary | ICD-10-CM | POA: Diagnosis not present

## 2023-10-06 DIAGNOSIS — N281 Cyst of kidney, acquired: Secondary | ICD-10-CM | POA: Diagnosis not present

## 2023-10-06 DIAGNOSIS — K5730 Diverticulosis of large intestine without perforation or abscess without bleeding: Secondary | ICD-10-CM | POA: Diagnosis not present

## 2023-10-06 HISTORY — DX: Disorder of kidney and ureter, unspecified: N28.9

## 2023-10-06 LAB — CBC
HCT: 50.7 % (ref 39.0–52.0)
Hemoglobin: 17.6 g/dL — ABNORMAL HIGH (ref 13.0–17.0)
MCH: 30.8 pg (ref 26.0–34.0)
MCHC: 34.7 g/dL (ref 30.0–36.0)
MCV: 88.6 fL (ref 80.0–100.0)
Platelets: 122 10*3/uL — ABNORMAL LOW (ref 150–400)
RBC: 5.72 MIL/uL (ref 4.22–5.81)
RDW: 13 % (ref 11.5–15.5)
WBC: 12.1 10*3/uL — ABNORMAL HIGH (ref 4.0–10.5)
nRBC: 0 % (ref 0.0–0.2)

## 2023-10-06 LAB — COMPREHENSIVE METABOLIC PANEL
ALT: 26 U/L (ref 0–44)
AST: 24 U/L (ref 15–41)
Albumin: 3.9 g/dL (ref 3.5–5.0)
Alkaline Phosphatase: 36 U/L — ABNORMAL LOW (ref 38–126)
Anion gap: 15 (ref 5–15)
BUN: 26 mg/dL — ABNORMAL HIGH (ref 8–23)
CO2: 19 mmol/L — ABNORMAL LOW (ref 22–32)
Calcium: 10.1 mg/dL (ref 8.9–10.3)
Chloride: 104 mmol/L (ref 98–111)
Creatinine, Ser: 1.86 mg/dL — ABNORMAL HIGH (ref 0.61–1.24)
GFR, Estimated: 38 mL/min — ABNORMAL LOW (ref >60)
Glucose, Bld: 171 mg/dL — ABNORMAL HIGH (ref 70–99)
Potassium: 4.4 mmol/L (ref 3.5–5.1)
Sodium: 138 mmol/L (ref 135–145)
Total Bilirubin: 1.3 mg/dL — ABNORMAL HIGH (ref 0.0–1.2)
Total Protein: 7.8 g/dL (ref 6.5–8.1)

## 2023-10-06 LAB — URINALYSIS, ROUTINE W REFLEX MICROSCOPIC
Bacteria, UA: NONE SEEN
Bilirubin Urine: NEGATIVE
Glucose, UA: >=500 mg/dL — AB
Ketones, ur: NEGATIVE mg/dL
Leukocytes,Ua: NEGATIVE
Nitrite: NEGATIVE
Protein, ur: NEGATIVE mg/dL
Specific Gravity, Urine: 1.03 (ref 1.005–1.030)
pH: 5 (ref 5.0–8.0)

## 2023-10-06 LAB — LIPASE, BLOOD: Lipase: 28 U/L (ref 11–51)

## 2023-10-06 MED ORDER — MORPHINE SULFATE (PF) 4 MG/ML IV SOLN
4.0000 mg | Freq: Once | INTRAVENOUS | Status: AC
  Administered 2023-10-06: 4 mg via INTRAVENOUS
  Filled 2023-10-06: qty 1

## 2023-10-06 MED ORDER — KETOROLAC TROMETHAMINE 15 MG/ML IJ SOLN
15.0000 mg | Freq: Once | INTRAMUSCULAR | Status: AC
  Administered 2023-10-06: 15 mg via INTRAVENOUS
  Filled 2023-10-06: qty 1

## 2023-10-06 MED ORDER — HYDROCODONE-ACETAMINOPHEN 5-325 MG PO TABS: 1.0000 | ORAL_TABLET | Freq: Four times a day (QID) | ORAL | 0 refills | Status: DC | PRN

## 2023-10-06 MED ORDER — SODIUM CHLORIDE 0.9 % IV BOLUS: 1000.0000 mL | Freq: Once | INTRAVENOUS | Status: DC

## 2023-10-06 NOTE — ED Notes (Signed)
 Patient Alert and oriented to baseline. Stable and ambulatory to baseline. Patient verbalized understanding of the discharge instructions.  Patient belongings were taken by the patient.

## 2023-10-06 NOTE — ED Notes (Signed)
 Patient transported to CT

## 2023-10-06 NOTE — ED Provider Notes (Signed)
 [place] EMERGENCY DEPARTMENT AT [facility] Provider Note   CSN: [id number] Arrival date & time: 10/06/23  9147     History  Chief Complaint  Patient presents with   Flank Pain    Stephen Stephenson is a 73 y.o. male.  HPI Patient presents with his wife who assists with the history.  Patient has multiple medical problems, including kidney stones, though he has had a few recent episodes of pain from these.  3 days ago the patient began experiencing left flank pain, rating to the abdomen.  Since that time he has had new anorexia, nausea, vomiting, and described pain in his thorax, head as well.  No fever, no confusion, no relief with anything. Incidentally, the patient was also lifting heavy objects 4 days ago.  He does not have any lumbosacral radiculopathy complaints, nor inability to urinate.    Home Medications Prior to Admission medications   Medication Sig Start Date End Date Taking? Authorizing Provider  apixaban (ELIQUIS) 2.5 MG TABS tablet Take 2.5 mg by mouth 2 (two) times daily. 06/03/21   [provider]  olmesartan (BENICAR) 40 MG tablet Take 20 mg by mouth daily. 08/23/20   [provider]  Pancrelipase, Lip-Prot-Amyl, (ZENPEP) 82956-213086 units CPEP Take by mouth. Takes 2 with meals. Gets#360 for 90 days. Gets through patient assistance.    [provider]  pantoprazole (PROTONIX) 40 MG tablet Take 1 tablet by mouth once daily 03/26/22   Gelene Mink, NP  potassium citrate (UROCIT-K) 10 MEQ (1080 MG) SR tablet Take 10 mEq by mouth 2 (two) times daily.     [provider]  simethicone (GAS-X) 80 MG chewable tablet Chew 1 tablet (80 mg total) by mouth every 6 (six) hours as needed for flatulence. 04/16/22   Lonell Grandchild, MD  triamcinolone (KENALOG) 0.1 % Apply 1 application topically 2 (two) times daily. APPLY CREAM EXTERNALLY TWICE DAILY FOR UP TO 2 CONTINUOUS WEEKS THEN TAKE A 2 WEEK BREAK BEFORE RESTARTING. 08/28/20    [provider]  TRIJARDY XR 5-2.11-998 MG TB24 Take 1 tablet by mouth 2 (two) times daily. 03/25/22   [provider]      Allergies    Patient has no known allergies.    Review of Systems   Review of Systems  Physical Exam Updated Vital Signs BP 136/76   Pulse 91   Temp 97.7 F (36.5 C) (Oral)   Resp 14   Ht 6\' 1"  (1.854 m)   Wt 93 kg   SpO2 93%   BMI 27.05 kg/m  Physical Exam Vitals and nursing note reviewed.  Constitutional:      General: He is not in acute distress.    Appearance: He is well-developed.  HENT:     Head: Normocephalic and atraumatic.  Eyes:     Conjunctiva/sclera: Conjunctivae normal.  Cardiovascular:     Rate and Rhythm: Normal rate and regular rhythm.  Pulmonary:     Effort: Pulmonary effort is normal. No respiratory distress.     Breath sounds: No stridor.  Abdominal:     General: There is no distension.     Tenderness: There is abdominal tenderness.  Skin:    General: Skin is warm and dry.  Neurological:     Mental Status: He is alert and oriented to person, place, and time.     ED Results / Procedures / Treatments   Labs (all labs ordered are listed, but only abnormal results are displayed)  Labs Reviewed  URINALYSIS, ROUTINE W REFLEX MICROSCOPIC - Abnormal; Notable for the following components:      Result Value   Glucose, UA >=500 (*)    Hgb urine dipstick MODERATE (*)    All other components within normal limits  CBC - Abnormal; Notable for the following components:   WBC 12.1 (*)    Hemoglobin 17.6 (*)    Platelets 122 (*)    All other components within normal limits  COMPREHENSIVE METABOLIC PANEL - Abnormal; Notable for the following components:   CO2 19 (*)    Glucose, Bld 171 (*)    BUN 26 (*)    Creatinine, Ser 1.86 (*)    Alkaline Phosphatase 36 (*)    Total Bilirubin 1.3 (*)    GFR, Estimated 38 (*)    All other components within normal limits  LIPASE, BLOOD    EKG None  Radiology CT Renal  Stone Study Result Date: 10/06/2023 CLINICAL DATA:  Abdominal/flank pain, stone suspected L flank pain, Hx of stones, but also lifted heavy objects four days ago EXAM: CT ABDOMEN AND PELVIS WITHOUT CONTRAST TECHNIQUE: Multidetector CT imaging of the abdomen and pelvis was performed following the standard protocol without IV contrast. RADIATION DOSE REDUCTION: This exam was performed according to the departmental dose-optimization program which includes automated exposure control, adjustment of the mA and/or kV according to patient size and/or use of iterative reconstruction technique. COMPARISON:  CT scan abdomen and pelvis from 04/16/2022. FINDINGS: Lower chest: There are scattered emphysematous blebs throughout visualized bilateral lungs. There are subpleural atelectatic changes in the visualized lung bases. No overt consolidation. No pleural effusion. The heart is normal in size. No pericardial effusion. Hepatobiliary: The liver is normal in size. Non-cirrhotic configuration. No suspicious mass. There are multiple hypoattenuating structures throughout the liver with largest in the left hepatic lobe, segment 4B measuring up to 1.6 x 2.2 cm, grossly similar to the prior study. No intrahepatic or extrahepatic bile duct dilation. Gallbladder is surgically absent. Pancreas: Unremarkable. No pancreatic ductal dilatation or surrounding inflammatory changes. Spleen: Size within normal limits. Redemonstration of a 1.6 x 2.1 cm simple cyst along the anterolateral aspect. Adrenals/Urinary Tract: Adrenal glands are unremarkable. No suspicious renal mass within the limitations of this unenhanced exam. There is a 1.0 x 1.4 cm sinus cyst in the right kidney upper pole. There is a 1.1 x 1.9 cm left ureteropelvic junction calculus causing mild-to-moderate proximal hydronephrosis. There are additional multiple calculi in the left kidney with largest in lower pole calyx measuring up to 5 x 8 mm. Left ureter distal to the calculus  is unremarkable. No right nephroureterolithiasis or obstructive uropathy. Urinary bladder is under distended, precluding optimal assessment. However, no large mass or stones identified. No perivesical fat stranding. Stomach/Bowel: No disproportionate dilation of the small or large bowel loops. No evidence of abnormal bowel wall thickening or inflammatory changes. The appendix is unremarkable. There are scattered diverticula mainly in the sigmoid colon, without imaging signs of diverticulitis. Vascular/Lymphatic: No ascites or pneumoperitoneum. No abdominal or pelvic lymphadenopathy, by size criteria. No aneurysmal dilation of the major abdominal arteries. There are mild peripheral atherosclerotic vascular calcifications of the aorta and its major branches. Reproductive: Enlarged prostate. Symmetric seminal vesicles. Other: There is a small fat containing left inguinal hernia. The soft tissues and abdominal wall are otherwise unremarkable. Musculoskeletal: No suspicious osseous lesions. There are mild - moderate multilevel degenerative changes in the visualized spine. IMPRESSION: 1. There is a 1.1 x 1.9 cm left  ureteropelvic junction calculus causing mild-to-moderate proximal hydronephrosis. There are additional multiple left renal calculi. No right nephroureterolithiasis or obstructive uropathy. 2. Multiple other nonacute observations, as described above. 3. Aortic atherosclerosis. Aortic Atherosclerosis (ICD10-I70.0). Electronically Signed   By: Jules Schick M.D.   On: 10/06/2023 11:19    Procedures Procedures    Medications Ordered in ED Medications  sodium chloride 0.9 % bolus 1,000 mL (1,000 mLs Intravenous Bolus 10/06/23 1011)  morphine (PF) 4 MG/ML injection 4 mg (4 mg Intravenous Given 10/06/23 1012)  ketorolac (TORADOL) 15 MG/ML injection 15 mg (15 mg Intravenous Given 10/06/23 1328)    ED Course/ Medical Decision Making/ A&P                                 Medical Decision Making Patient  with a history of kidney stones, multiple other medical problems, PE, now presents with flank pain.  Patient has no dyspnea, low suspicion for PE though he is tachycardic.  Patient has no urinary complaints, and low suspicion for cauda equina, concern given his recent lifting of heavy objects.  Concern for kidney stone versus other urinary tract etiology.  Patient had labs fluids CT morphine. Cardiac 110 sinus tach abnormal pulse ox 95% borderline  Amount and/or Complexity of Data Reviewed Independent Historian: spouse External Data Reviewed: notes. Labs: ordered. Decision-making details documented in ED Course. Radiology: ordered and independent interpretation performed. Decision-making details documented in ED Course.  Risk Prescription drug management. Decision regarding hospitalization.   2:48 PM Patient continues to have pain, after transiently improving.  Patient has received fluids, fentanyl.  Now I have discussed the patient's case with his own urologist, Dr. Earlene Plater, and with our urologist Dr. Ronne Binning.  Given ongoing pain patient will receive Toradol, can be seen tomorrow in the office.  Patient is afebrile, awake, alert, has no hypotension strictly bacteremia, sepsis and urinalysis was inconsistent with infection as well.   2:48 PM Patient much more comfortable.  He will follow-up with urology tomorrow.        Final Clinical Impression(s) / ED Diagnoses Final diagnoses:  Kidney stone    Rx / DC Orders ED Discharge Orders     None         Gerhard Munch, MD 10/06/23 [phone]

## 2023-10-06 NOTE — ED Triage Notes (Signed)
 Pt bib pov w/ c/o of flank pain. Pt reports constant pain with intermittent severe pain. Pt reports he has a significant hx of kidney stones and sees a nephrologist to manage kidney stones. Pt reports nausea and vomiting. PT reports dizziness.

## 2023-10-06 NOTE — Discharge Instructions (Addendum)
 With your kidney stone on the left side it is important to follow-up with your urologist, Dr. Ronne Binning tomorrow.     Return here for concerning changes in your condition.

## 2023-10-08 ENCOUNTER — Other Ambulatory Visit: Payer: Self-pay

## 2023-10-08 ENCOUNTER — Ambulatory Visit (HOSPITAL_COMMUNITY)
Admission: RE | Admit: 2023-10-08 | Discharge: 2023-10-08 | Disposition: A | Discharge status: Home / Self Care | Source: Ambulatory Visit | Attending: Urology | Admitting: Urology

## 2023-10-08 ENCOUNTER — Ambulatory Visit: Admitting: Urology

## 2023-10-08 ENCOUNTER — Encounter: Payer: Self-pay | Admitting: Urology

## 2023-10-08 VITALS — BP 116/74 | HR 90

## 2023-10-08 DIAGNOSIS — N202 Calculus of kidney with calculus of ureter: Secondary | ICD-10-CM | POA: Diagnosis not present

## 2023-10-08 DIAGNOSIS — N2889 Other specified disorders of kidney and ureter: Secondary | ICD-10-CM | POA: Diagnosis not present

## 2023-10-08 DIAGNOSIS — N200 Calculus of kidney: Secondary | ICD-10-CM

## 2023-10-08 DIAGNOSIS — I493 Ventricular premature depolarization: Secondary | ICD-10-CM | POA: Diagnosis not present

## 2023-10-08 LAB — URINALYSIS, ROUTINE W REFLEX MICROSCOPIC
Bilirubin, UA: NEGATIVE
Ketones, UA: NEGATIVE
Leukocytes,UA: NEGATIVE
Nitrite, UA: NEGATIVE
Protein,UA: NEGATIVE
Specific Gravity, UA: 1.02 (ref 1.005–1.030)
Urobilinogen, Ur: 0.2 mg/dL (ref 0.2–1.0)
pH, UA: 5.5 (ref 5.0–7.5)

## 2023-10-08 LAB — MICROSCOPIC EXAMINATION: Bacteria, UA: NONE SEEN

## 2023-10-08 MED ORDER — KETOROLAC TROMETHAMINE 10 MG PO TABS: 10.0000 mg | ORAL_TABLET | Freq: Four times a day (QID) | ORAL | 0 refills | Status: DC | PRN

## 2023-10-08 MED ORDER — KETOROLAC TROMETHAMINE 60 MG/2ML IM SOLN
60.0000 mg | Freq: Once | INTRAMUSCULAR | Status: AC
  Administered 2023-10-08: 60 mg via INTRAMUSCULAR

## 2023-10-08 MED ORDER — OXYCODONE-ACETAMINOPHEN 5-325 MG PO TABS: 1.0000 | ORAL_TABLET | ORAL | 0 refills | Status: DC | PRN

## 2023-10-08 NOTE — Patient Instructions (Signed)
 ESWL for Kidney Stones  Extracorporeal shock wave lithotripsy (ESWL) is a treatment that can help break up kidney stones that are too large to pass on their own.  This is a nonsurgical procedure that breaks up a kidney stone with shock waves. These shock waves pass through your body and focus on the kidney stone. They cause the kidney stone to break into smaller pieces (fragments) while it is still in the urinary tract. The fragments of stone can pass more easily out of your body in the pee (urine). Tell a health care provider about: Any allergies you have. All medicines you are taking, including vitamins, herbs, eye drops, creams, and over-the-counter medicines. Any problems you or family members have had with anesthetic medicines. Any bleeding problems you have. Any surgeries you've had. Any medical conditions you have. Whether you're pregnant or may be pregnant. What are the risks? Your health care provider will talk with you about risks. These may include: Infection. Bleeding from the kidney. Bruising of the kidney or skin. Scarring of the kidney. This can lead to: Increased blood pressure. Poor kidney function. Return (recurrence) of kidney stones. Damage to other structures or organs. This may include the liver, colon, spleen, or pancreas. Blockage (obstruction) of the tube that carries pee from the kidney to the bladder (ureter). Failure of the kidney stone to break into fragments. What happens before the procedure? When to stop eating and drinking Follow instructions from your health care provider about what you may eat and drink. These may include: 8 hours before your procedure Stop eating most foods. Do not eat meat, fried foods, or fatty foods. Eat only light foods, such as toast or crackers. All liquids are okay except energy drinks and alcohol. 6 hours before your procedure Stop eating. Drink only clear liquids, such as water, clear fruit juice, black coffee, plain tea,  and sports drinks. Do not drink energy drinks or alcohol. 2 hours before your procedure Stop drinking all liquids. You may be allowed to take medicines with small sips of water. If you do not follow your health care provider's instructions, your procedure may be delayed or canceled. Medicines Ask your health care provider about: Changing or stopping your regular medicines. These include any diabetes medicines or blood thinners you take. Taking medicines such as aspirin and ibuprofen. These medicines can thin your blood. Do not take them unless your health care provider tells you to. Taking over-the-counter medicines, vitamins, herbs, and supplements. Tests You may have tests, such as: Blood tests. Pee (urine) tests. Imaging tests. This may include a CT scan. Surgery safety Ask your health care provider: How your surgery site will be marked. What steps will be taken to help prevent infection. These steps may include: Washing skin with a soap that kills germs. Receiving antibiotics. General instructions If you will be going home right after the procedure, plan to have a responsible adult: Take you home from the hospital or clinic. You will not be allowed to drive. Care for you for the time you are told. What happens during the procedure?  An IV will be inserted into one of your veins. You may be given: A sedative. This helps you relax. Anesthesia. This will: Numb certain areas of your body. Make you fall asleep for surgery. A water-filled cushion may be placed behind your kidney or on your abdomen. In some cases, you may be placed in a tub of lukewarm water. Your body will be positioned in a way that makes it  easier to target the kidney stone. An X-ray or ultrasound exam will be done to locate your stone. Shock waves will be aimed at the stone. If you are awake, you may feel a tapping sensation as the shock waves pass through your body. A small mesh tube (stent) may be placed in  your ureter. This will help keep pee flowing from the kidney if the fragments of the stone have been blocking the ureter. The stent will be removed at a later time by your health care provider. The procedure may vary among health care providers and hospitals. What happens after the procedure? Your blood pressure, heart rate, breathing rate, and blood oxygen level will be monitored until you leave the hospital or clinic. You may have an X-ray after the procedure to see how many of the kidney stones were broken up. This will also show how much of the stone has passed. If there are still large fragments after treatment, you may need to have a second procedure at a later time. This information is not intended to replace advice given to you by your health care provider. Make sure you discuss any questions you have with your health care provider. Document Revised: 01/11/2023 Document Reviewed: 11/01/2021 Elsevier Patient Education  2024 [employer].

## 2023-10-08 NOTE — Progress Notes (Signed)
 10/08/2023 1:56 PM   Stephen Stephenson 1950/10/26 [id number]  Referring provider: Assunta Found, MD [address] [place],  [place] [address]  Left flank pain   HPI: [name] is a 73yo here for evaluation of nephrolithiasis. He developed left flank pain 5 days ago. He presented to the ER Monday and was diagnosed with a 11mm left UPJ calculus. UA shows 3-10 RBCs/hpf. Currently he has sharp, intermittent mild to moderate left flank pain.    PMH: Past Medical History:  Diagnosis Date   DM (diabetes mellitus) (HCC)    type II   GERD (gastroesophageal reflux disease)    Hb-AS genotype (HCC)    Hepatitis C    genotype 1 eradicated   History of kidney stones    History of pulmonary embolus (PE) 2022   Hypertension    IBS (irritable bowel syndrome)    Insomnia    NASH (nonalcoholic steatohepatitis)    Metavir F3, 10/2013, patient reports completing hepatitis a and B vaccination. 2016   Renal disorder     Surgical History: Past Surgical History:  Procedure Laterality Date   BACK SURGERY     CHOLECYSTECTOMY     COLONOSCOPY  02/05/2002   anal canal herrhoids ow normal/normal ileum/suspect pt bled from hemorrhoids presently   COLONOSCOPY  09/27/2011   UXL:KGMWNUUV hemorrhoids. Hepatic flexure polyp-removed TUBULAR ADENOMA, due for surveillance March 2016   COLONOSCOPY N/A 01/20/2015   OZD:GUYQIH colonic polyp removed/colonic diverticulosis   COLONOSCOPY N/A 07/05/2020   single 3 mm polyp at the rectosigmoid colon and a 7 mm innocent appearing AVM at the base of the cecum, otherwise normal.  Surgical pathology found the polyp to be hyperplastic.  Recommended no future colonoscopy unless new symptoms develop.   CYSTOSCOPY W/ URETERAL STENT PLACEMENT Left 02/03/2013   Procedure: CYSTOSCOPY WITH RETROGRADE PYELOGRAM/URETERAL STENT PLACEMENT;  Surgeon: Ky Barban, MD;  Location: [facility];  Service: Urology;  Laterality: Left;   ESOPHAGOGASTRODUODENOSCOPY  05/17/2005   normal  esophagus/gastric mucosa appeared normal   ESOPHAGOGASTRODUODENOSCOPY N/A 04/25/2014   Abnormal gastric mucosa, benign biopsies. No esophageal varices. Next EGD October 2018.   ESOPHAGOGASTRODUODENOSCOPY (EGD) WITH PROPOFOL N/A 01/08/2018   nonerosive reflux esophagitis status post Maloney dilation, portal hypertensive gastropathy, normal duodenum. Surveillance was due 2021   ESOPHAGOGASTRODUODENOSCOPY (EGD) WITH PROPOFOL N/A 09/24/2023   Procedure: ESOPHAGOGASTRODUODENOSCOPY (EGD) WITH PROPOFOL;  Surgeon: Corbin Ade, MD;  Location: [facility];  Service: Endoscopy;  Laterality: N/A;  10:45 am, asa 3   EXTRACORPOREAL SHOCK WAVE LITHOTRIPSY Left 02/03/2013   Procedure: EXTRACORPOREAL SHOCK WAVE LITHOTRIPSY (ESWL) LEFT RENAL CALCULUS;  Surgeon: Ky Barban, MD;  Location: [facility];  Service: Urology;  Laterality: Left;   KIDNEY STONE SURGERY     MALONEY DILATION N/A 01/08/2018   Procedure: Elease Hashimoto DILATION;  Surgeon: Corbin Ade, MD;  Location: [facility];  Service: Endoscopy;  Laterality: N/A;   POLYPECTOMY  07/05/2020   Procedure: POLYPECTOMY;  Surgeon: Corbin Ade, MD;  Location: [facility];  Service: Endoscopy;;  recto-sigmoid    SHOULDER SURGERY      Home Medications:  Allergies as of 10/08/2023   No Known Allergies      Medication List        Accurate as of October 08, 2023  1:56 PM. If you have any questions, ask your nurse or doctor.          apixaban 2.5 MG Tabs tablet Commonly known as: ELIQUIS Take 2.5 mg by mouth  2 (two) times daily.   HYDROcodone-acetaminophen 5-325 MG tablet Commonly known as: NORCO/VICODIN Take 1 tablet by mouth every 6 (six) hours as needed for severe pain (pain score 7-10).   olmesartan 40 MG tablet Commonly known as: BENICAR Take 20 mg by mouth daily.   pantoprazole 40 MG tablet Commonly known as: PROTONIX Take 1 tablet by mouth once daily   potassium citrate 10 MEQ (1080 MG) SR tablet Commonly known as:  UROCIT-K Take 10 mEq by mouth 2 (two) times daily.   simethicone 80 MG chewable tablet Commonly known as: Gas-X Chew 1 tablet (80 mg total) by mouth every 6 (six) hours as needed for flatulence.   triamcinolone cream 0.1 % Commonly known as: KENALOG Apply 1 application topically 2 (two) times daily. APPLY CREAM EXTERNALLY TWICE DAILY FOR UP TO 2 CONTINUOUS WEEKS THEN TAKE A 2 WEEK BREAK BEFORE RESTARTING.   Trijardy XR 5-2.11-998 MG Tb24 Generic drug: Empagliflozin-Linaglip-Metform Take 1 tablet by mouth 2 (two) times daily.   Zenpep 56213-086578 units Cpep Generic drug: Pancrelipase (Lip-Prot-Amyl) Take by mouth. Takes 2 with meals. Gets#360 for 90 days. Gets through patient assistance.        Allergies: No Known Allergies  Family History: Family History  Problem Relation Age of Onset   Liver disease Father        ? Hepatitis C; Alcoholic   Colon cancer Neg Hx     Social History:  reports that he quit smoking about 28 years ago. His smoking use included cigarettes. He started smoking about 53 years ago. He has a 25 pack-year smoking history. He quit smokeless tobacco use about 31 years ago. He reports that he does not currently use alcohol. He reports that he does not use drugs.  ROS: All other review of systems were reviewed and are negative except what is noted above in HPI  Physical Exam: BP 116/74   Pulse 90   Constitutional:  Alert and oriented, No acute distress. HEENT: [place] AT, moist mucus membranes.  Trachea midline, no masses. Cardiovascular: No clubbing, cyanosis, or edema. Respiratory: Normal respiratory effort, no increased work of breathing. GI: Abdomen is soft, nontender, nondistended, no abdominal masses GU: No CVA tenderness.  Lymph: No cervical or inguinal lymphadenopathy. Skin: No rashes, bruises or suspicious lesions. Neurologic: Grossly intact, no focal deficits, moving all 4 extremities. Psychiatric: Normal mood and affect.  Laboratory  Data: Lab Results  Component Value Date   WBC 12.1 (H) 10/06/2023   HGB 17.6 (H) 10/06/2023   HCT 50.7 10/06/2023   MCV 88.6 10/06/2023   PLT 122 (L) 10/06/2023    Lab Results  Component Value Date   CREATININE 1.86 (H) 10/06/2023    No results found for: "PSA"  No results found for: "TESTOSTERONE"  Lab Results  Component Value Date   HGBA1C 7.4 (H) 10/10/2020    Urinalysis    Component Value Date/Time   COLORURINE YELLOW 10/06/2023 0915   APPEARANCEUR CLEAR 10/06/2023 0915   LABSPEC 1.030 10/06/2023 0915   PHURINE 5.0 10/06/2023 0915   GLUCOSEU >=500 (A) 10/06/2023 0915   HGBUR MODERATE (A) 10/06/2023 0915   BILIRUBINUR NEGATIVE 10/06/2023 0915   KETONESUR NEGATIVE 10/06/2023 0915   PROTEINUR NEGATIVE 10/06/2023 0915   UROBILINOGEN 0.2 01/20/2013 2215   NITRITE NEGATIVE 10/06/2023 0915   LEUKOCYTESUR NEGATIVE 10/06/2023 0915    Lab Results  Component Value Date   BACTERIA NONE SEEN 10/06/2023    Pertinent Imaging: CT 10/06/2023: Images reviewed and discussed with the patient  Results for orders placed during the hospital encounter of 02/03/13  DG Abd 1 View - KUB  Narrative *RADIOLOGY REPORT*  Clinical Data: Pre lithotripsy.  ABDOMEN - 1 VIEW  Comparison: 01/28/2013  Findings: Small calcifications project over the lower pole of the left kidney, stable.  Vascular calcifications and phleboliths in the pelvis.  No additional suspicious calcification. Nonobstructive bowel gas pattern.  No free air or acute bony abnormality.  IMPRESSION: Left lower pole nephrolithiasis.   Original Report Authenticated By: Charlett Nose, M.D.  No results found for this or any previous visit.  No results found for this or any previous visit.  No results found for this or any previous visit.  Results for orders placed during the hospital encounter of 05/06/14  US Renal  Narrative CLINICAL DATA:  Kidney stones.  EXAM: RENAL/URINARY TRACT ULTRASOUND  COMPLETE  COMPARISON:  Ultrasound of October 21, 2013.  FINDINGS: Right Kidney:  Length: 11.6 cm. Echogenicity within normal limits. No mass or hydronephrosis visualized.  Left Kidney:  Length: 12 cm. At least 2 calculi are noted in the lower pole of the left kidney, with the largest measuring 6 mm. Echogenicity within normal limits. No mass or hydronephrosis visualized.  Bladder:  Appears normal for degree of bladder distention.  IMPRESSION: Nonobstructive left nephrolithiasis. No other renal abnormality noted.   Electronically Signed By: Roque Lias M.D. On: 05/06/2014 10:11  No results found for this or any previous visit.  No results found for this or any previous visit.  Results for orders placed during the hospital encounter of 10/06/23  CT Renal Stone Study  Narrative CLINICAL DATA:  Abdominal/flank pain, stone suspected L flank pain, Hx of stones, but also lifted heavy objects four days ago  EXAM: CT ABDOMEN AND PELVIS WITHOUT CONTRAST  TECHNIQUE: Multidetector CT imaging of the abdomen and pelvis was performed following the standard protocol without IV contrast.  RADIATION DOSE REDUCTION: This exam was performed according to the departmental dose-optimization program which includes automated exposure control, adjustment of the mA and/or kV according to patient size and/or use of iterative reconstruction technique.  COMPARISON:  CT scan abdomen and pelvis from 04/16/2022.  FINDINGS: Lower chest: There are scattered emphysematous blebs throughout visualized bilateral lungs. There are subpleural atelectatic changes in the visualized lung bases. No overt consolidation. No pleural effusion. The heart is normal in size. No pericardial effusion.  Hepatobiliary: The liver is normal in size. Non-cirrhotic configuration. No suspicious mass. There are multiple hypoattenuating structures throughout the liver with largest in the left hepatic lobe, segment 4B  measuring up to 1.6 x 2.2 cm, grossly similar to the prior study. No intrahepatic or extrahepatic bile duct dilation. Gallbladder is surgically absent.  Pancreas: Unremarkable. No pancreatic ductal dilatation or surrounding inflammatory changes.  Spleen: Size within normal limits. Redemonstration of a 1.6 x 2.1 cm simple cyst along the anterolateral aspect.  Adrenals/Urinary Tract: Adrenal glands are unremarkable. No suspicious renal mass within the limitations of this unenhanced exam. There is a 1.0 x 1.4 cm sinus cyst in the right kidney upper pole. There is a 1.1 x 1.9 cm left ureteropelvic junction calculus causing mild-to-moderate proximal hydronephrosis. There are additional multiple calculi in the left kidney with largest in lower pole calyx measuring up to 5 x 8 mm. Left ureter distal to the calculus is unremarkable. No right nephroureterolithiasis or obstructive uropathy. Urinary bladder is under distended, precluding optimal assessment. However, no large mass or stones identified. No perivesical fat stranding.  Stomach/Bowel: No disproportionate  dilation of the small or large bowel loops. No evidence of abnormal bowel wall thickening or inflammatory changes. The appendix is unremarkable. There are scattered diverticula mainly in the sigmoid colon, without imaging signs of diverticulitis.  Vascular/Lymphatic: No ascites or pneumoperitoneum. No abdominal or pelvic lymphadenopathy, by size criteria. No aneurysmal dilation of the major abdominal arteries. There are mild peripheral atherosclerotic vascular calcifications of the aorta and its major branches.  Reproductive: Enlarged prostate. Symmetric seminal vesicles.  Other: There is a small fat containing left inguinal hernia. The soft tissues and abdominal wall are otherwise unremarkable.  Musculoskeletal: No suspicious osseous lesions. There are mild - moderate multilevel degenerative changes in the visualized  spine.  IMPRESSION: 1. There is a 1.1 x 1.9 cm left ureteropelvic junction calculus causing mild-to-moderate proximal hydronephrosis. There are additional multiple left renal calculi. No right nephroureterolithiasis or obstructive uropathy. 2. Multiple other nonacute observations, as described above. 3. Aortic atherosclerosis.  Aortic Atherosclerosis (ICD10-I70.0).   Electronically Signed By: Jules Schick M.D. On: 10/06/2023 11:19   Assessment & Plan:    1. Kidney stones (Primary) -We discussed the management of kidney stones. These options include observation, ureteroscopy, shockwave lithotripsy (ESWL) and percutaneous nephrolithotomy (PCNL). We discussed which options are relevant to the patient's stone(s). We discussed the natural history of kidney stones as well as the complications of untreated stones and the impact on quality of life without treatment as well as with each of the above listed treatments. We also discussed the efficacy of each treatment in its ability to clear the stone burden. With any of these management options I discussed the signs and symptoms of infection and the need for emergent treatment should these be experienced. For each option we discussed the ability of each procedure to clear the patient of their stone burden.   For observation I described the risks which include but are not limited to silent renal damage, life-threatening infection, need for emergent surgery, failure to pass stone and pain.   For ureteroscopy I described the risks which include bleeding, infection, damage to contiguous structures, positioning injury, ureteral stricture, ureteral avulsion, ureteral injury, need for prolonged ureteral stent, inability to perform ureteroscopy, need for an interval procedure, inability to clear stone burden, stent discomfort/pain, heart attack, stroke, pulmonary embolus and the inherent risks with general anesthesia.   For shockwave lithotripsy I  described the risks which include arrhythmia, kidney contusion, kidney hemorrhage, need for transfusion, pain, inability to adequately break up stone, inability to pass stone fragments, Steinstrasse, infection associated with obstructing stones, need for alternate surgical procedure, need for repeat shockwave lithotripsy, MI, CVA, PE and the inherent risks with anesthesia/conscious sedation.   For PCNL I described the risks including positioning injury, pneumothorax, hydrothorax, need for chest tube, inability to clear stone burden, renal laceration, arterial venous fistula or malformation, need for embolization of kidney, loss of kidney or renal function, need for repeat procedure, need for prolonged nephrostomy tube, ureteral avulsion, MI, CVA, PE and the inherent risks of general anesthesia.   - The patient would like to proceed with left ESWL - Urinalysis, Routine w reflex microscopic   No follow-ups on file.  Wilkie Aye, MD  [facility] [place]

## 2023-10-09 NOTE — Pre-Procedure Instructions (Signed)
  PCP clearance Received: Today Elsie Amis, RN  Grier Rocher, CMA; Ferdinand Lango, RN; Jethro Bolus, RN; Lillia Mountain, RN; Myra Rude, RN; 1 other Good afternoon. I saw the note for PCP clearance for Stephen Stephenson in special needs. Have you guys gotten that yet or is it in the notes somewhere and I just didn't see it?

## 2023-10-10 ENCOUNTER — Encounter (HOSPITAL_COMMUNITY)
Admission: RE | Admit: 2023-10-10 | Discharge: 2023-10-10 | Disposition: A | Discharge status: Home / Self Care | Source: Ambulatory Visit | Attending: Urology | Admitting: Urology

## 2023-10-13 ENCOUNTER — Other Ambulatory Visit: Payer: Self-pay

## 2023-10-13 ENCOUNTER — Encounter (HOSPITAL_COMMUNITY): Payer: Self-pay

## 2023-10-14 ENCOUNTER — Other Ambulatory Visit: Payer: Self-pay

## 2023-10-14 ENCOUNTER — Encounter (HOSPITAL_COMMUNITY): Payer: Self-pay | Admitting: Urology

## 2023-10-14 ENCOUNTER — Ambulatory Visit (HOSPITAL_COMMUNITY)
Admission: RE | Admit: 2023-10-14 | Discharge: 2023-10-14 | Disposition: A | Discharge status: Home / Self Care | Attending: Emergency Medicine | Admitting: Emergency Medicine

## 2023-10-14 ENCOUNTER — Ambulatory Visit (HOSPITAL_COMMUNITY)

## 2023-10-14 ENCOUNTER — Encounter (HOSPITAL_COMMUNITY)
Admission: RE | Disposition: A | Discharge status: Home / Self Care | Payer: Self-pay | Source: Home / Self Care | Attending: Emergency Medicine

## 2023-10-14 DIAGNOSIS — E119 Type 2 diabetes mellitus without complications: Secondary | ICD-10-CM | POA: Insufficient documentation

## 2023-10-14 DIAGNOSIS — Z7901 Long term (current) use of anticoagulants: Secondary | ICD-10-CM | POA: Insufficient documentation

## 2023-10-14 DIAGNOSIS — I493 Ventricular premature depolarization: Secondary | ICD-10-CM | POA: Diagnosis not present

## 2023-10-14 DIAGNOSIS — I10 Essential (primary) hypertension: Secondary | ICD-10-CM | POA: Diagnosis not present

## 2023-10-14 DIAGNOSIS — I4891 Unspecified atrial fibrillation: Secondary | ICD-10-CM | POA: Insufficient documentation

## 2023-10-14 DIAGNOSIS — Z86711 Personal history of pulmonary embolism: Secondary | ICD-10-CM | POA: Diagnosis not present

## 2023-10-14 DIAGNOSIS — R932 Abnormal findings on diagnostic imaging of liver and biliary tract: Secondary | ICD-10-CM | POA: Diagnosis not present

## 2023-10-14 DIAGNOSIS — M79604 Pain in right leg: Secondary | ICD-10-CM | POA: Diagnosis not present

## 2023-10-14 DIAGNOSIS — R0789 Other chest pain: Secondary | ICD-10-CM | POA: Insufficient documentation

## 2023-10-14 DIAGNOSIS — K219 Gastro-esophageal reflux disease without esophagitis: Secondary | ICD-10-CM | POA: Insufficient documentation

## 2023-10-14 DIAGNOSIS — N200 Calculus of kidney: Secondary | ICD-10-CM

## 2023-10-14 DIAGNOSIS — Z7984 Long term (current) use of oral hypoglycemic drugs: Secondary | ICD-10-CM | POA: Diagnosis not present

## 2023-10-14 DIAGNOSIS — Q272 Other congenital malformations of renal artery: Secondary | ICD-10-CM | POA: Diagnosis not present

## 2023-10-14 DIAGNOSIS — R079 Chest pain, unspecified: Secondary | ICD-10-CM | POA: Diagnosis not present

## 2023-10-14 DIAGNOSIS — N132 Hydronephrosis with renal and ureteral calculous obstruction: Secondary | ICD-10-CM | POA: Insufficient documentation

## 2023-10-14 DIAGNOSIS — M79605 Pain in left leg: Secondary | ICD-10-CM | POA: Diagnosis not present

## 2023-10-14 DIAGNOSIS — N2889 Other specified disorders of kidney and ureter: Secondary | ICD-10-CM | POA: Diagnosis not present

## 2023-10-14 DIAGNOSIS — K5730 Diverticulosis of large intestine without perforation or abscess without bleeding: Secondary | ICD-10-CM | POA: Diagnosis not present

## 2023-10-14 HISTORY — PX: EXTRACORPOREAL SHOCK WAVE LITHOTRIPSY: SHX1557

## 2023-10-14 LAB — COMPREHENSIVE METABOLIC PANEL WITH GFR
ALT: 32 U/L (ref 0–44)
AST: 28 U/L (ref 15–41)
Albumin: 3.8 g/dL (ref 3.5–5.0)
Alkaline Phosphatase: 32 U/L — ABNORMAL LOW (ref 38–126)
Anion gap: 11 (ref 5–15)
BUN: 35 mg/dL — ABNORMAL HIGH (ref 8–23)
CO2: 23 mmol/L (ref 22–32)
Calcium: 10 mg/dL (ref 8.9–10.3)
Chloride: 108 mmol/L (ref 98–111)
Creatinine, Ser: 1.56 mg/dL — ABNORMAL HIGH (ref 0.61–1.24)
GFR, Estimated: 46 mL/min — ABNORMAL LOW (ref >60)
Glucose, Bld: 116 mg/dL — ABNORMAL HIGH (ref 70–99)
Potassium: 4.9 mmol/L (ref 3.5–5.1)
Sodium: 142 mmol/L (ref 135–145)
Total Bilirubin: 1 mg/dL (ref 0.0–1.2)
Total Protein: 7.7 g/dL (ref 6.5–8.1)

## 2023-10-14 LAB — CBC WITH DIFFERENTIAL/PLATELET
Abs Immature Granulocytes: 0.04 10*3/uL (ref 0.00–0.07)
Basophils Absolute: 0 10*3/uL (ref 0.0–0.1)
Basophils Relative: 1 %
Eosinophils Absolute: 0.2 10*3/uL (ref 0.0–0.5)
Eosinophils Relative: 4 %
HCT: 47.4 % (ref 39.0–52.0)
Hemoglobin: 16.5 g/dL (ref 13.0–17.0)
Immature Granulocytes: 1 %
Lymphocytes Relative: 28 %
Lymphs Abs: 1.6 10*3/uL (ref 0.7–4.0)
MCH: 30.7 pg (ref 26.0–34.0)
MCHC: 34.8 g/dL (ref 30.0–36.0)
MCV: 88.3 fL (ref 80.0–100.0)
Monocytes Absolute: 0.5 10*3/uL (ref 0.1–1.0)
Monocytes Relative: 9 %
Neutro Abs: 3.5 10*3/uL (ref 1.7–7.7)
Neutrophils Relative %: 57 %
Platelets: 137 10*3/uL — ABNORMAL LOW (ref 150–400)
RBC: 5.37 MIL/uL (ref 4.22–5.81)
RDW: 12.8 % (ref 11.5–15.5)
WBC: 5.9 10*3/uL (ref 4.0–10.5)
nRBC: 0 % (ref 0.0–0.2)

## 2023-10-14 LAB — TROPONIN I (HIGH SENSITIVITY)
Troponin I (High Sensitivity): 5 ng/L (ref <18)
Troponin I (High Sensitivity): 3 ng/L (ref <18)

## 2023-10-14 LAB — MAGNESIUM: Magnesium: 1.9 mg/dL (ref 1.7–2.4)

## 2023-10-14 LAB — GLUCOSE, CAPILLARY: Glucose-Capillary: 109 mg/dL — ABNORMAL HIGH (ref 70–99)

## 2023-10-14 LAB — LIPASE, BLOOD: Lipase: 37 U/L (ref 11–51)

## 2023-10-14 SURGERY — LITHOTRIPSY, ESWL
Anesthesia: LOCAL | Laterality: Left

## 2023-10-14 MED ORDER — DIPHENHYDRAMINE HCL 25 MG PO CAPS
25.0000 mg | ORAL_CAPSULE | ORAL | Status: AC
  Administered 2023-10-14: 25 mg via ORAL
  Filled 2023-10-14: qty 1

## 2023-10-14 MED ORDER — LACTATED RINGERS IV BOLUS
500.0000 mL | Freq: Once | INTRAVENOUS | Status: AC
  Administered 2023-10-14: 500 mL via INTRAVENOUS

## 2023-10-14 MED ORDER — METOPROLOL TARTRATE 25 MG PO TABS: 12.5000 mg | ORAL_TABLET | Freq: Once | ORAL | Status: DC

## 2023-10-14 MED ORDER — METOPROLOL SUCCINATE ER 25 MG PO TB24: 25.0000 mg | ORAL_TABLET | Freq: Every day | ORAL | 2 refills | Status: AC

## 2023-10-14 MED ORDER — ASPIRIN 81 MG PO CHEW
324.0000 mg | CHEWABLE_TABLET | Freq: Once | ORAL | Status: AC
  Administered 2023-10-14: 324 mg via ORAL
  Filled 2023-10-14: qty 4

## 2023-10-14 MED ORDER — METOPROLOL TARTRATE 25 MG PO TABS
25.0000 mg | ORAL_TABLET | Freq: Once | ORAL | Status: AC
  Administered 2023-10-14: 25 mg via ORAL
  Filled 2023-10-14: qty 1

## 2023-10-14 MED ORDER — OXYCODONE-ACETAMINOPHEN 5-325 MG PO TABS
1.0000 | ORAL_TABLET | Freq: Once | ORAL | Status: AC
  Administered 2023-10-14: 1 via ORAL
  Filled 2023-10-14: qty 1

## 2023-10-14 MED ORDER — IOHEXOL 350 MG/ML SOLN
125.0000 mL | Freq: Once | INTRAVENOUS | Status: AC | PRN
  Administered 2023-10-14: 125 mL via INTRAVENOUS

## 2023-10-14 MED ORDER — DIAZEPAM 5 MG PO TABS
10.0000 mg | ORAL_TABLET | Freq: Once | ORAL | Status: AC
  Administered 2023-10-14: 10 mg via ORAL
  Filled 2023-10-14: qty 2

## 2023-10-14 NOTE — Discharge Instructions (Addendum)
 Your CTA showed the following: "1. Atherosclerotic disease in the abdominal aorta without aneurysm, dissection or acute abnormality. 2. Bilateral runoff disease. Single-vessel runoff in the left lower extremity. Two vessel runoff in the right lower extremity. 3. No significant inflow or outflow occlusive disease. 4. 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 Enlarged collateral vessels in the pancreaticoduodenal arcade are secondary to this high-grade stenosis."  Your Eliquis will help avoid further clogging of these narrowed vessels.  If you would like to follow-up with a vascular surgeon, telephone number is below.  Your test results today are overall reassuring.  On the cardiac monitor, you have frequent premature ventricular contractions.  A prescription for medication called metoprolol was sent to your pharmacy.  Take daily as prescribed.  Monitor your blood pressure at home.   Dr. Ronne Binning said that he will reach out to reschedule your lithotripsy.    Return to the emergency department for any new or worsening symptoms of concern.

## 2023-10-14 NOTE — ED Provider Notes (Signed)
 [facility] EMERGENCY DEPARTMENT AT [facility] Provider Note   CSN: [id number] Arrival date & time: 10/14/23  0857     History  Chief Complaint  Patient presents with   Chest Pain    Stephen Stephenson is a 73 y.o. male.  HPI Patient presents for chest pain.  Medical history includes DM, HTN, GERD, PE, nephrolithiasis.  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  Plan is for lifelong therapy.  A week and a half ago, he developed left flank pain.  At that time, he stopped taking his Eliquis in anticipation of needing a procedure for kidney stone.  Kidney stone was confirmed during ED visit 8 days ago.  Patient was seen by urology 2 days later with plan for ESWL.  This procedure was scheduled for today.  Over the past 2 weeks, patient has had bilateral lower extremity pain.  He describes this as aching.  It is worse at night and improves when he gets out of bed and walks around.  He has also had intermittent chest pain.  Chest pain seems to migrate between left and right side of chest.  He denies any chest pain currently.  When he went to his lithotripsy appointment, he informed them of his recent chest pain.  There were concerns about abnormal tracings on cardiac monitor.  He was sent to the ED for further evaluation.    Home Medications Prior to Admission medications   Medication Sig Start Date End Date Taking? Authorizing Provider  HYDROcodone-acetaminophen (NORCO/VICODIN) 5-325 MG tablet Take 1 tablet by mouth every 6 (six) hours as needed for severe pain (pain score 7-10). 10/06/23  Yes Gerhard Munch, MD  metoprolol succinate (TOPROL-XL) 25 MG 24 hr tablet Take 1 tablet (25 mg total) by mouth daily. 10/14/23 01/12/24 Yes Gloris Manchester, MD  olmesartan (BENICAR) 40 MG tablet Take 20 mg by mouth daily. 08/23/20  Yes [provider]  oxyCODONE-acetaminophen (PERCOCET) 5-325 MG tablet Take 1 tablet by mouth every 4 (four) hours as needed. 10/08/23  Yes McKenzie, Mardene Celeste, MD   Pancrelipase, Lip-Prot-Amyl, (ZENPEP) 08657-846962 units CPEP Take by mouth. Takes 2 with meals. Gets#360 for 90 days. Gets through patient assistance.   Yes [provider]  pantoprazole (PROTONIX) 40 MG tablet Take 1 tablet by mouth once daily 03/26/22  Yes Gelene Mink, NP  potassium citrate (UROCIT-K) 10 MEQ (1080 MG) SR tablet Take 10 mEq by mouth 2 (two) times daily.    Yes [provider]  simethicone (GAS-X) 80 MG chewable tablet Chew 1 tablet (80 mg total) by mouth every 6 (six) hours as needed for flatulence. 04/16/22  Yes Lonell Grandchild, MD  triamcinolone (KENALOG) 0.1 % Apply 1 application topically 2 (two) times daily. APPLY CREAM EXTERNALLY TWICE DAILY FOR UP TO 2 CONTINUOUS WEEKS THEN TAKE A 2 WEEK BREAK BEFORE RESTARTING. 08/28/20  Yes [provider]  TRIJARDY XR 5-2.11-998 MG TB24 Take 1 tablet by mouth 2 (two) times daily. 03/25/22  Yes [provider]  apixaban (ELIQUIS) 2.5 MG TABS tablet Take 2.5 mg by mouth 2 (two) times daily. 06/03/21   [provider]  glimepiride (AMARYL) 4 MG tablet Take 2-4 mg by mouth 2 (two) times daily.    [provider]  ketorolac (TORADOL) 10 MG tablet Take 1 tablet (10 mg total) by mouth every 6 (six) hours as needed. 10/08/23   McKenzie, Mardene Celeste, MD  Vitamin D, Ergocalciferol, (DRISDOL) 1.25 MG (50000 UNIT) CAPS  capsule Take 50,000 Units by mouth every 7 (seven) days. 07/10/23   [provider]      Allergies    Patient has no known allergies.    Review of Systems   Review of Systems  Cardiovascular:  Positive for chest pain.  Musculoskeletal:  Positive for myalgias.  All other systems reviewed and are negative.   Physical Exam Updated Vital Signs BP (!) 159/96 (BP Location: Right Arm)   Pulse 77   Temp 97.9 F (36.6 C) (Oral)   Resp 16   Ht 6\' 1"  (1.854 m)   Wt 93 kg   SpO2 97%   BMI 27.05 kg/m  Physical Exam Vitals and nursing note reviewed.  Constitutional:       General: He is not in acute distress.    Appearance: Normal appearance. He is well-developed. He is not ill-appearing, toxic-appearing or diaphoretic.  HENT:     Head: Normocephalic and atraumatic.     Right Ear: External ear normal.     Left Ear: External ear normal.     Nose: Nose normal.     Mouth/Throat:     Mouth: Mucous membranes are moist.  Eyes:     Extraocular Movements: Extraocular movements intact.     Conjunctiva/sclera: Conjunctivae normal.  Cardiovascular:     Rate and Rhythm: Normal rate and regular rhythm.     Heart sounds: No murmur heard. Pulmonary:     Effort: Pulmonary effort is normal. No respiratory distress.  Chest:     Chest wall: No tenderness.  Abdominal:     General: There is no distension.     Palpations: Abdomen is soft.     Tenderness: There is no abdominal tenderness.  Musculoskeletal:        General: No swelling.     Cervical back: Normal range of motion and neck supple.     Right lower leg: No edema.     Left lower leg: No edema.  Skin:    General: Skin is warm and dry.     Findings: No erythema.  Neurological:     General: No focal deficit present.     Mental Status: He is alert and oriented to person, place, and time.  Psychiatric:        Mood and Affect: Mood normal.        Behavior: Behavior normal.     ED Results / Procedures / Treatments   Labs (all labs ordered are listed, but only abnormal results are displayed) Labs Reviewed  GLUCOSE, CAPILLARY - Abnormal; Notable for the following components:      Result Value   Glucose-Capillary 109 (*)    All other components within normal limits  COMPREHENSIVE METABOLIC PANEL WITH GFR - Abnormal; Notable for the following components:   Glucose, Bld 116 (*)    BUN 35 (*)    Creatinine, Ser 1.56 (*)    Alkaline Phosphatase 32 (*)    GFR, Estimated 46 (*)    All other components within normal limits  CBC WITH DIFFERENTIAL/PLATELET - Abnormal; Notable for the following components:    Platelets 137 (*)    All other components within normal limits  MAGNESIUM  LIPASE, BLOOD  CBG MONITORING, ED  TROPONIN I (HIGH SENSITIVITY)  TROPONIN I (HIGH SENSITIVITY)    EKG EKG Interpretation Date/Time:  Tuesday October 14 2023 09:48:07 EDT Ventricular Rate:  86 PR Interval:    QRS Duration:  139 QT Interval:  431 QTC Calculation: 442 R Axis:   -  13  Text Interpretation: Sinus rhythm in a pattern of bigeminy Confirmed by Gloris Manchester [phone]) on 10/14/2023 9:53:23 AM  Radiology CT Angio Aortobifemoral W and/or Wo Contrast Result Date: 10/14/2023 CLINICAL DATA:  Connective tissue vascular disease, known or suspected. Chest pain. EXAM: CT ANGIOGRAPHY OF ABDOMINAL AORTA WITH ILIOFEMORAL RUNOFF TECHNIQUE: Multidetector CT imaging of the abdomen, pelvis and lower extremities was performed using the standard protocol during bolus administration of intravenous contrast. Multiplanar CT image reconstructions and MIPs were obtained to evaluate the vascular anatomy. RADIATION DOSE REDUCTION: This exam was performed according to the departmental dose-optimization program which includes automated exposure control, adjustment of the mA and/or kV according to patient size and/or use of iterative reconstruction technique. CONTRAST:  OMNIPAQUE IOHEXOL 350 MG/ML SOLN COMPARISON:  CT renal stone protocol 12/06/2023 and CT 04/16/2022 FINDINGS: VASCULAR Aorta: Atherosclerotic calcifications in the abdominal aorta without aneurysm, dissection or significant stenosis. Celiac: High-grade stenosis at the origin of the celiac trunk and configuration of the stenosis is suggestive for median arcuate ligament compression rather than atherosclerotic disease. Main branches of the celiac trunk are patent. Prominent pancreaticoduodenal arteries are likely secondary to the high-grade stenosis of the celiac trunk. SMA: Patent without evidence of aneurysm, dissection, vasculitis or significant stenosis. Renals: Bilateral  renal arteries are patent without aneurysm, dissection or significant stenosis. Small bilateral accessory renal arteries. IMA: Patent without evidence of aneurysm, dissection, vasculitis or significant stenosis. RIGHT Lower Extremity Inflow: Common, internal and external iliac arteries are patent without evidence of aneurysm, dissection, vasculitis or significant stenosis. Outflow: Right profunda femoral arteries are patent. Mild stenosis in the proximal right SFA. Right popliteal artery is widely patent. Runoff: Anterior tibial occludes in the mid calf. Two vessel runoff from the peroneal artery and posterior tibial artery. Dominant runoff vessel is the posterior tibial artery. LEFT Lower Extremity Inflow: Common, internal and external iliac arteries are patent without evidence of aneurysm, dissection, vasculitis or significant stenosis. Outflow: Common, superficial and profunda femoral arteries and the popliteal artery are patent without evidence of aneurysm, dissection, vasculitis or significant stenosis. Runoff: Single-vessel runoff from the posterior tibial artery. Peroneal artery occludes in the mid calf. Anterior tibial artery appears to occlude the proximal/mid calf. Veins: Circumaortic left renal vein is a normal variant. Otherwise, the venous structures are unremarkable. Review of the MIP images confirms the above findings. NON-VASCULAR Lower chest: Scattered cysts in the lower lungs. Mild dependent changes in the lungs without pleural effusions. Hepatobiliary: Numerous hypodensities scattered throughout the liver probably represent cysts based on the prior imaging. Gallbladder removed. Pancreas: Unremarkable. No pancreatic ductal dilatation or surrounding inflammatory changes. Spleen: Stable hypodensity in lateral aspect of the spleen measures 1.5 cm and minimally changed since 2023. Suspect this is an incidental finding. Spleen is normal for size. Adrenals/Urinary Tract: Normal adrenal glands. Normal  appearance of the right kidney without hydronephrosis. Normal appearance of the urinary bladder. Persistent mild-to-moderate left hydronephrosis with multiple left renal calculi and a large stone at the left ureteropelvic junction. The UPJ stone measures 1.2 x 1.6 x 1.3 cm and previously measured 1.4 x 1.9 x 1.1 cm. Stable left perinephric edema. No suspicious renal lesions. Left ureter is decompressed distal to the UPJ stone. Stomach/Bowel: Diverticula involving the sigmoid colon without acute inflammation. Normal appendix. No bowel dilatation. No evidence for a bowel obstruction. Normal appearance the stomach. Lymphatic: No significant lymph node enlargement in the abdomen or pelvis. Reproductive: Prostate is stable. Asymmetry of the seminal vesicles, left side larger than right. The  seminal vesicle asymmetry is similar to the exam in 2023. Other: Small left inguinal hernia containing fat. Negative for free fluid. Negative for free air. Musculoskeletal: No acute bone abnormality. Multilevel degenerative endplate disease in lumbar spine. Chronic disc space narrowing at L5-S1. IMPRESSION: VASCULAR 1. Atherosclerotic disease in the abdominal aorta without aneurysm, dissection or acute abnormality. 2. Bilateral runoff disease. Single-vessel runoff in the left lower extremity. Two vessel runoff in the right lower extremity. 3. No significant inflow or outflow occlusive disease. 4. 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 Enlarged collateral vessels in the pancreaticoduodenal arcade are secondary to this high-grade stenosis. NON-VASCULAR 1. Mild-to-moderate left hydronephrosis has minimally changed. Hydronephrosis is secondary to a large stone at the left ureteropelvic junction. Additional left renal calculi. Stable left perinephric edema. 2. Additional stable findings as described. Electronically Signed   By: Richarda Overlie M.D.   On: 10/14/2023 14:44    US Venous Img Lower Bilateral Result Date: 10/14/2023 CLINICAL DATA:  Bilateral lower extremity pain. EXAM: BILATERAL LOWER EXTREMITY VENOUS DOPPLER ULTRASOUND TECHNIQUE: Gray-scale sonography with graded compression, as well as color Doppler and duplex ultrasound were performed to evaluate the lower extremity deep venous systems from the level of the common femoral vein and including the common femoral, femoral, profunda femoral, popliteal and calf veins including the posterior tibial, peroneal and gastrocnemius veins when visible. The superficial great saphenous vein was also interrogated. Spectral Doppler was utilized to evaluate flow at rest and with distal augmentation maneuvers in the common femoral, femoral and popliteal veins. COMPARISON:  None Available. FINDINGS: RIGHT LOWER EXTREMITY Common Femoral Vein: No evidence of thrombus. Normal compressibility, respiratory phasicity and response to augmentation. Saphenofemoral Junction: No evidence of thrombus. Normal compressibility and flow on color Doppler imaging. Profunda Femoral Vein: No evidence of thrombus. Normal compressibility and flow on color Doppler imaging. Femoral Vein: No evidence of thrombus. Normal compressibility, respiratory phasicity and response to augmentation. Popliteal Vein: No evidence of thrombus. Normal compressibility, respiratory phasicity and response to augmentation. Calf Veins: No evidence of thrombus. Normal compressibility and flow on color Doppler imaging. Superficial Great Saphenous Vein: No evidence of thrombus. Normal compressibility. Venous Reflux:  None. Other Findings: No evidence of superficial thrombophlebitis or abnormal fluid collection. LEFT LOWER EXTREMITY Common Femoral Vein: No evidence of thrombus. Normal compressibility, respiratory phasicity and response to augmentation. Saphenofemoral Junction: No evidence of thrombus. Normal compressibility and flow on color Doppler imaging. Profunda Femoral Vein: No  evidence of thrombus. Normal compressibility and flow on color Doppler imaging. Femoral Vein: No evidence of thrombus. Normal compressibility, respiratory phasicity and response to augmentation. Popliteal Vein: No evidence of thrombus. Normal compressibility, respiratory phasicity and response to augmentation. Calf Veins: No evidence of thrombus. Normal compressibility and flow on color Doppler imaging. Superficial Great Saphenous Vein: No evidence of thrombus. Normal compressibility. Venous Reflux:  None. Other Findings: No evidence of superficial thrombophlebitis or abnormal fluid collection. IMPRESSION: No evidence of deep venous thrombosis in either lower extremity. Electronically Signed   By: Irish Lack M.D.   On: 10/14/2023 11:33   DG Chest Portable 1 View Result Date: 10/14/2023 CLINICAL DATA:  Chest pain per triage. EXAM: PORTABLE CHEST 1 VIEW COMPARISON:  12/10/2013. FINDINGS: Bilateral lung fields are clear. Bilateral costophrenic angles are clear. Normal cardio-mediastinal silhouette. No acute osseous abnormalities. The soft tissues are within normal limits. IMPRESSION: No active disease. Electronically Signed   By: Jules Schick M.D.   On: 10/14/2023 10:51  DG Abd 1 View Result Date: 10/14/2023 CLINICAL DATA:  Left kidney stones. EXAM: ABDOMEN - 1 VIEW COMPARISON:  X-ray 10/08/2023.  CT 10/06/2023. FINDINGS: Once again there is a 21 mm calcification overlying the left renal pelvis or proximal ureter seen on CT. Additional small tubal stones overlying the left reniform shadow. No definite abnormal calcifications overlying the right kidney. The punctate calcifications in the pelvis very well could be vascular. Gas seen in nondilated loops of small and large bowel. Scattered colonic stool. Degenerative changes along the spine. IMPRESSION: Multiple left-sided renal stones. Larger calcifications seen medial to left kidney which could be in the proximal ureter or distal renal pelvis as seen on prior  CT scan Electronically Signed   By: Karen Kays M.D.   On: 10/14/2023 10:35    Procedures Procedures    Medications Ordered in ED Medications  diphenhydrAMINE (BENADRYL) capsule 25 mg (25 mg Oral Given 10/14/23 0704)  diazepam (VALIUM) tablet 10 mg (10 mg Oral Given 10/14/23 0704)  aspirin chewable tablet 324 mg (324 mg Oral Given 10/14/23 0933)  lactated ringers bolus 500 mL (0 mLs Intravenous Stopped 10/14/23 1241)  metoprolol tartrate (LOPRESSOR) tablet 25 mg (25 mg Oral Given 10/14/23 1324)  iohexol (OMNIPAQUE) 350 MG/ML injection 125 mL (125 mLs Intravenous Contrast Given 10/14/23 1303)  oxyCODONE-acetaminophen (PERCOCET/ROXICET) 5-325 MG per tablet 1 tablet (1 tablet Oral Given 10/14/23 1451)    ED Course/ Medical Decision Making/ A&P                                 Medical Decision Making Amount and/or Complexity of Data Reviewed Labs: ordered. Radiology: ordered.  Risk OTC drugs. Prescription drug management.   This patient presents to the ED for concern of chest pain, leg pain, this involves an extensive number of treatment options, and is a complaint that carries with it a high risk of complications and morbidity.  The differential diagnosis includes ACS, pericarditis, GERD, arrhythmia, PAD, DVT, myositis   Co morbidities that complicate the patient evaluation  DM, HTN, GERD, PE, nephrolithiasis   Additional history obtained:  Additional history obtained from N/A External records from outside source obtained and reviewed including EMR   Lab Tests:  I Ordered, and personally interpreted labs.  The pertinent results include: Baseline creatinine, normal electrolytes, normal hemoglobin, no leukocytosis, normal troponins x 2   Imaging Studies ordered:  I ordered imaging studies including chest x-ray, bilateral DVT study, CTA with runoff I independently visualized and interpreted imaging which showed PAD without any occlusions, no evidence of DVT, high-grade stenosis at  origin of the celiac trunk I agree with the radiologist interpretation   Cardiac Monitoring: / EKG:  The patient was maintained on a cardiac monitor.  I personally viewed and interpreted the cardiac monitored which showed an underlying rhythm of: Sinus rhythm   Problem List / ED Course / Critical interventions / Medication management  Patient presenting for intermittent chest pain and bilateral lower extremity pain.  This is in the setting of discontinuing his home Eliquis approximately 10 days ago when he developed left flank pain.  This left flank pain has been secondary to kidney stone which he was scheduled to receive lithotripsy for today.  His procedure was canceled given his recent chest pain.  On arrival in the ED, he is chest pain-free.  Although he describes recent aching in his lower extremities, lower extremities not show swelling, erythema, warmth, or  any significant tenderness.  Given that his leg pain is worsened at night and improves with getting up and walking around, this may be secondary to PAD, however, feet are warm, well-perfused, with strong DP and PT pulses bilaterally.  EKG in the ED shows bigeminy.  Patient was placed on cardiac monitor.  While in the ED, he had intermittent bigeminy and frequent PVCs.  He did not have any sustained runs of ventricular tachycardia.  He remains chest pain-free.  He continues to have some mild aching in his lower extremities.  While in the ED, patient had low back pain which he attributes to laying in uncomfortable stretcher.  Percocet was ordered.  While going to the bathroom, he reports painless episode of hematochezia.  He was observed in the ED and did go have another bowel movement.  He reports only trace blood on second bowel movement.  He remained hemodynamically stable.  CTA with runoff showed evidence of PAD without obstruction or occlusion.  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  Patient was informed of these findings and  provided with vascular surgery contact information for follow-up.  I reached out to his urologist, Dr. Ronne Binning, who will coordinate a rescheduling for his lithotripsy.  Patient did have reduced PVCs after dose of metoprolol.  Will prescribe this.  Patient was discharged in stable condition. I ordered medication including IV fluids for hydration; Percocet for analgesia; metoprolol for frequent PVCs Reevaluation of the patient after these medicines showed that the patient improved I have reviewed the patients home medicines and have made adjustments as needed   Social Determinants of Health:  Has access to outpatient care        Final Clinical Impression(s) / ED Diagnoses Final diagnoses:  PVC's (premature ventricular contractions)    Rx / DC Orders ED Discharge Orders          Ordered    metoprolol succinate (TOPROL-XL) 25 MG 24 hr tablet  Daily        10/14/23 1619              Gloris Manchester, MD 10/14/23 1625

## 2023-10-14 NOTE — Progress Notes (Signed)
 Lithotripsy was aborted per Dr. Ronne Binning and Dr. Ronne Binning spoke with ED doctor.   Dr. Ronne Binning called and was told to transfer pt to ed the were given report on PT.   Pt in wc pushed to ED by Rn with all belongings  and wife was him

## 2023-10-14 NOTE — ED Triage Notes (Signed)
 Pt brought over by short stay for chest pain after lithotripsy.

## 2023-10-16 ENCOUNTER — Encounter (HOSPITAL_COMMUNITY): Payer: Self-pay | Admitting: Urology

## 2023-10-17 ENCOUNTER — Other Ambulatory Visit: Payer: Self-pay | Admitting: Urology

## 2023-10-17 DIAGNOSIS — N200 Calculus of kidney: Secondary | ICD-10-CM

## 2023-10-20 DIAGNOSIS — E782 Mixed hyperlipidemia: Secondary | ICD-10-CM | POA: Diagnosis not present

## 2023-10-20 DIAGNOSIS — I493 Ventricular premature depolarization: Secondary | ICD-10-CM | POA: Diagnosis not present

## 2023-10-20 DIAGNOSIS — E663 Overweight: Secondary | ICD-10-CM | POA: Diagnosis not present

## 2023-10-20 DIAGNOSIS — E559 Vitamin D deficiency, unspecified: Secondary | ICD-10-CM | POA: Diagnosis not present

## 2023-10-20 DIAGNOSIS — D6869 Other thrombophilia: Secondary | ICD-10-CM | POA: Diagnosis not present

## 2023-10-20 DIAGNOSIS — I2699 Other pulmonary embolism without acute cor pulmonale: Secondary | ICD-10-CM | POA: Diagnosis not present

## 2023-10-20 DIAGNOSIS — G720 Drug-induced myopathy: Secondary | ICD-10-CM | POA: Diagnosis not present

## 2023-10-20 DIAGNOSIS — I10 Essential (primary) hypertension: Secondary | ICD-10-CM | POA: Diagnosis not present

## 2023-10-20 DIAGNOSIS — Z6828 Body mass index (BMI) 28.0-28.9, adult: Secondary | ICD-10-CM | POA: Diagnosis not present

## 2023-10-20 DIAGNOSIS — K7400 Hepatic fibrosis, unspecified: Secondary | ICD-10-CM | POA: Diagnosis not present

## 2023-10-20 DIAGNOSIS — D696 Thrombocytopenia, unspecified: Secondary | ICD-10-CM | POA: Diagnosis not present

## 2023-10-20 DIAGNOSIS — E1140 Type 2 diabetes mellitus with diabetic neuropathy, unspecified: Secondary | ICD-10-CM | POA: Diagnosis not present

## 2023-10-30 ENCOUNTER — Ambulatory Visit: Admitting: Urology

## 2023-11-07 ENCOUNTER — Encounter (HOSPITAL_COMMUNITY)
Admission: RE | Admit: 2023-11-07 | Discharge: 2023-11-07 | Disposition: A | Discharge status: Home / Self Care | Source: Ambulatory Visit | Attending: Urology | Admitting: Urology

## 2023-11-10 ENCOUNTER — Encounter (HOSPITAL_COMMUNITY): Payer: Self-pay

## 2023-11-10 ENCOUNTER — Other Ambulatory Visit: Payer: Self-pay

## 2023-11-10 MED ORDER — MIDAZOLAM HCL 2 MG/2ML IJ SOLN
INTRAMUSCULAR | Status: AC
  Filled 2023-11-10: qty 2

## 2023-11-10 NOTE — Patient Instructions (Signed)
 PAT completed today with patient via phone call. Answered all questions at this time.

## 2023-11-10 NOTE — Pre-Procedure Instructions (Signed)
 PAT completed today via phone with patient. Answered all questions at this time.

## 2023-11-10 NOTE — Anesthesia Preprocedure Evaluation (Signed)
 Anesthesia Evaluation  Patient identified by MRN, date of birth, ID band Patient awake    Reviewed: Allergy & Precautions, H&P , NPO status , Patient's Chart, lab work & pertinent test results, reviewed documented beta blocker date and time   Airway Mallampati: II  TM Distance: >3 FB Neck ROM: full    Dental no notable dental hx. (+) Dental Advisory Given, Teeth Intact   Pulmonary former smoker, PE Stop Bang 4   Pulmonary exam normal breath sounds clear to auscultation       Cardiovascular Exercise Tolerance: Good hypertension, Normal cardiovascular exam Rhythm:regular Rate:Normal     Neuro/Psych negative neurological ROS  negative psych ROS   GI/Hepatic ,GERD  ,,(+) Hepatitis -, CNASH   Endo/Other  diabetes, Well Controlled, Type 2    Renal/GU Renal InsufficiencyRenal diseaseRenal disorder  negative genitourinary   Musculoskeletal   Abdominal   Peds  Hematology negative hematology ROS (+)   Anesthesia Other Findings   Reproductive/Obstetrics negative OB ROS                             Anesthesia Physical Anesthesia Plan  ASA: 3  Anesthesia Plan: General   Post-op Pain Management: Dilaudid  IV   Induction: Intravenous  PONV Risk Score and Plan: Ondansetron , Dexamethasone and Midazolam   Airway Management Planned: Oral ETT  Additional Equipment: None  Intra-op Plan:   Post-operative Plan: Extubation in OR  Informed Consent: I have reviewed the patients History and Physical, chart, labs and discussed the procedure including the risks, benefits and alternatives for the proposed anesthesia with the patient or authorized representative who has indicated his/her understanding and acceptance.     Dental Advisory Given  Plan Discussed with: CRNA and Surgeon  Anesthesia Plan Comments:        Anesthesia Quick Evaluation

## 2023-11-10 NOTE — Progress Notes (Signed)
 Dr.Ewell notified patient tool trijardy today before his surgery with Dr.McKenzie tomorrow. Per Dr.Ewell okay to proceed as scheduled for tomorrow. Patient is aware not to take this any more today or tomorrow until after surgery.

## 2023-11-11 ENCOUNTER — Other Ambulatory Visit: Payer: Self-pay

## 2023-11-11 ENCOUNTER — Encounter (HOSPITAL_COMMUNITY)
Admission: RE | Disposition: A | Discharge status: Home / Self Care | Payer: Self-pay | Source: Home / Self Care | Attending: Urology

## 2023-11-11 ENCOUNTER — Ambulatory Visit (HOSPITAL_COMMUNITY): Payer: Self-pay | Admitting: Anesthesiology

## 2023-11-11 ENCOUNTER — Encounter (HOSPITAL_COMMUNITY): Payer: Self-pay | Admitting: Urology

## 2023-11-11 ENCOUNTER — Ambulatory Visit (HOSPITAL_COMMUNITY)
Admission: RE | Admit: 2023-11-11 | Discharge: 2023-11-11 | Disposition: A | Discharge status: Home / Self Care | Attending: Urology | Admitting: Urology

## 2023-11-11 ENCOUNTER — Ambulatory Visit (HOSPITAL_BASED_OUTPATIENT_CLINIC_OR_DEPARTMENT_OTHER): Payer: Self-pay | Admitting: Anesthesiology

## 2023-11-11 ENCOUNTER — Ambulatory Visit (HOSPITAL_COMMUNITY)

## 2023-11-11 DIAGNOSIS — I10 Essential (primary) hypertension: Secondary | ICD-10-CM | POA: Insufficient documentation

## 2023-11-11 DIAGNOSIS — Z86711 Personal history of pulmonary embolism: Secondary | ICD-10-CM | POA: Diagnosis not present

## 2023-11-11 DIAGNOSIS — N132 Hydronephrosis with renal and ureteral calculous obstruction: Secondary | ICD-10-CM | POA: Diagnosis not present

## 2023-11-11 DIAGNOSIS — E119 Type 2 diabetes mellitus without complications: Secondary | ICD-10-CM

## 2023-11-11 DIAGNOSIS — Z7984 Long term (current) use of oral hypoglycemic drugs: Secondary | ICD-10-CM | POA: Diagnosis not present

## 2023-11-11 DIAGNOSIS — N200 Calculus of kidney: Secondary | ICD-10-CM

## 2023-11-11 DIAGNOSIS — Z87891 Personal history of nicotine dependence: Secondary | ICD-10-CM | POA: Insufficient documentation

## 2023-11-11 DIAGNOSIS — K7581 Nonalcoholic steatohepatitis (NASH): Secondary | ICD-10-CM | POA: Diagnosis not present

## 2023-11-11 DIAGNOSIS — K219 Gastro-esophageal reflux disease without esophagitis: Secondary | ICD-10-CM | POA: Insufficient documentation

## 2023-11-11 DIAGNOSIS — N201 Calculus of ureter: Secondary | ICD-10-CM | POA: Diagnosis not present

## 2023-11-11 HISTORY — PX: CYSTOSCOPY/URETEROSCOPY/HOLMIUM LASER/STENT PLACEMENT: SHX6546

## 2023-11-11 HISTORY — PX: HOLMIUM LASER APPLICATION: SHX5852

## 2023-11-11 LAB — GLUCOSE, CAPILLARY
Glucose-Capillary: 144 mg/dL — ABNORMAL HIGH (ref 70–99)
Glucose-Capillary: 163 mg/dL — ABNORMAL HIGH (ref 70–99)

## 2023-11-11 SURGERY — CYSTOSCOPY/URETEROSCOPY/HOLMIUM LASER/STENT PLACEMENT
Anesthesia: General | Site: Ureter | Laterality: Left

## 2023-11-11 MED ORDER — KETOROLAC TROMETHAMINE 30 MG/ML IJ SOLN
INTRAMUSCULAR | Status: DC | PRN
  Administered 2023-11-11: 30 mg via INTRAVENOUS

## 2023-11-11 MED ORDER — ROCURONIUM BROMIDE 10 MG/ML (PF) SYRINGE
PREFILLED_SYRINGE | INTRAVENOUS | Status: AC
  Filled 2023-11-11: qty 10

## 2023-11-11 MED ORDER — PROPOFOL 10 MG/ML IV BOLUS
INTRAVENOUS | Status: AC
Start: 2023-11-11 — End: ?
  Filled 2023-11-11: qty 20

## 2023-11-11 MED ORDER — PROPOFOL 10 MG/ML IV BOLUS
INTRAVENOUS | Status: AC
  Filled 2023-11-11: qty 20

## 2023-11-11 MED ORDER — ORAL CARE MOUTH RINSE: 15.0000 mL | Freq: Once | OROMUCOSAL | Status: AC

## 2023-11-11 MED ORDER — OXYCODONE HCL 5 MG/5ML PO SOLN: 5.0000 mg | Freq: Once | ORAL | Status: AC | PRN

## 2023-11-11 MED ORDER — ONDANSETRON HCL 4 MG/2ML IJ SOLN
INTRAMUSCULAR | Status: AC
  Filled 2023-11-11: qty 2

## 2023-11-11 MED ORDER — SUGAMMADEX SODIUM 200 MG/2ML IV SOLN
INTRAVENOUS | Status: DC | PRN
  Administered 2023-11-11: 200 mg via INTRAVENOUS

## 2023-11-11 MED ORDER — ONDANSETRON HCL 4 MG/2ML IJ SOLN: INTRAMUSCULAR | Status: DC | PRN

## 2023-11-11 MED ORDER — LACTATED RINGERS IV SOLN: INTRAVENOUS | Status: DC

## 2023-11-11 MED ORDER — WATER FOR IRRIGATION, STERILE IR SOLN
Status: DC | PRN
  Administered 2023-11-11: 1000 mL

## 2023-11-11 MED ORDER — DEXMEDETOMIDINE HCL IN NACL 80 MCG/20ML IV SOLN
INTRAVENOUS | Status: DC | PRN
  Administered 2023-11-11: 8 ug via INTRAVENOUS

## 2023-11-11 MED ORDER — ONDANSETRON HCL 4 MG/2ML IJ SOLN
INTRAMUSCULAR | Status: DC | PRN
Start: 2023-11-11 — End: 2023-11-11
  Administered 2023-11-11: 4 mg via INTRAVENOUS

## 2023-11-11 MED ORDER — DIATRIZOATE MEGLUMINE 30 % UR SOLN
URETHRAL | Status: DC | PRN
  Administered 2023-11-11: 5 mL via URETHRAL

## 2023-11-11 MED ORDER — DEXAMETHASONE SODIUM PHOSPHATE 10 MG/ML IJ SOLN
INTRAMUSCULAR | Status: DC | PRN
  Administered 2023-11-11: 5 mg via INTRAVENOUS

## 2023-11-11 MED ORDER — FENTANYL CITRATE (PF) 100 MCG/2ML IJ SOLN
INTRAMUSCULAR | Status: DC | PRN
  Administered 2023-11-11 (×2): 25 ug via INTRAVENOUS
  Administered 2023-11-11: 50 ug via INTRAVENOUS

## 2023-11-11 MED ORDER — FENTANYL CITRATE (PF) 100 MCG/2ML IJ SOLN
INTRAMUSCULAR | Status: AC
  Filled 2023-11-11: qty 2

## 2023-11-11 MED ORDER — KETOROLAC TROMETHAMINE 30 MG/ML IJ SOLN
INTRAMUSCULAR | Status: AC
Start: 2023-11-11 — End: ?
  Filled 2023-11-11: qty 1

## 2023-11-11 MED ORDER — PROPOFOL 10 MG/ML IV BOLUS
INTRAVENOUS | Status: DC | PRN
Start: 2023-11-11 — End: 2023-11-11
  Administered 2023-11-11 (×2): 50 mg via INTRAVENOUS
  Administered 2023-11-11: 150 mg via INTRAVENOUS

## 2023-11-11 MED ORDER — LIDOCAINE 2% (20 MG/ML) 5 ML SYRINGE
INTRAMUSCULAR | Status: DC | PRN
  Administered 2023-11-11: 100 mg via INTRAVENOUS

## 2023-11-11 MED ORDER — SODIUM CHLORIDE 0.9 % IV SOLN: 12.5000 mg | INTRAVENOUS | Status: DC | PRN

## 2023-11-11 MED ORDER — MIDAZOLAM HCL 2 MG/2ML IJ SOLN
INTRAMUSCULAR | Status: DC | PRN
  Administered 2023-11-11: 2 mg via INTRAVENOUS

## 2023-11-11 MED ORDER — DEXAMETHASONE SODIUM PHOSPHATE 10 MG/ML IJ SOLN
INTRAMUSCULAR | Status: AC
  Filled 2023-11-11: qty 1

## 2023-11-11 MED ORDER — DIATRIZOATE MEGLUMINE 30 % UR SOLN
URETHRAL | Status: AC
  Filled 2023-11-11: qty 100

## 2023-11-11 MED ORDER — OXYCODONE-ACETAMINOPHEN 5-325 MG PO TABS: 1.0000 | ORAL_TABLET | Freq: Three times a day (TID) | ORAL | 0 refills | Status: DC | PRN

## 2023-11-11 MED ORDER — SODIUM BICARBONATE 650 MG PO TABS: 650.0000 mg | ORAL_TABLET | Freq: Two times a day (BID) | ORAL | 11 refills | Status: AC

## 2023-11-11 MED ORDER — GLYCOPYRROLATE PF 0.2 MG/ML IJ SOSY
PREFILLED_SYRINGE | INTRAMUSCULAR | Status: AC
  Filled 2023-11-11: qty 1

## 2023-11-11 MED ORDER — PHENYLEPHRINE 80 MCG/ML (10ML) SYRINGE FOR IV PUSH (FOR BLOOD PRESSURE SUPPORT)
PREFILLED_SYRINGE | INTRAVENOUS | Status: DC | PRN
  Administered 2023-11-11 (×4): 80 ug via INTRAVENOUS

## 2023-11-11 MED ORDER — CHLORHEXIDINE GLUCONATE 0.12 % MT SOLN
15.0000 mL | Freq: Once | OROMUCOSAL | Status: AC
  Administered 2023-11-11: 15 mL via OROMUCOSAL
  Filled 2023-11-11: qty 15

## 2023-11-11 MED ORDER — TAMSULOSIN HCL 0.4 MG PO CAPS: 0.4000 mg | ORAL_CAPSULE | Freq: Every day | ORAL | 0 refills | Status: DC

## 2023-11-11 MED ORDER — SODIUM CHLORIDE 0.9 % IR SOLN
Status: DC | PRN
  Administered 2023-11-11: 3000 mL

## 2023-11-11 MED ORDER — LACTATED RINGERS IV SOLN
INTRAVENOUS | Status: DC
Start: 2023-11-11 — End: 2023-11-11

## 2023-11-11 MED ORDER — LIDOCAINE 2% (20 MG/ML) 5 ML SYRINGE
INTRAMUSCULAR | Status: AC
Start: 2023-11-11 — End: ?
  Filled 2023-11-11: qty 5

## 2023-11-11 MED ORDER — FENTANYL CITRATE PF 50 MCG/ML IJ SOSY
25.0000 ug | PREFILLED_SYRINGE | INTRAMUSCULAR | Status: DC | PRN
Start: 2023-11-11 — End: 2023-11-11

## 2023-11-11 MED ORDER — OXYCODONE HCL 5 MG PO TABS
5.0000 mg | ORAL_TABLET | Freq: Once | ORAL | Status: AC | PRN
  Administered 2023-11-11: 5 mg via ORAL
  Filled 2023-11-11: qty 1

## 2023-11-11 MED ORDER — MIDAZOLAM HCL 2 MG/2ML IJ SOLN
INTRAMUSCULAR | Status: AC
Start: 2023-11-11 — End: ?
  Filled 2023-11-11: qty 2

## 2023-11-11 MED ORDER — ROCURONIUM BROMIDE 10 MG/ML (PF) SYRINGE
PREFILLED_SYRINGE | INTRAVENOUS | Status: DC | PRN
Start: 2023-11-11 — End: 2023-11-11
  Administered 2023-11-11: 50 mg via INTRAVENOUS

## 2023-11-11 MED ORDER — CEFAZOLIN SODIUM-DEXTROSE 2-4 GM/100ML-% IV SOLN
2.0000 g | INTRAVENOUS | Status: AC
  Administered 2023-11-11: 2 g via INTRAVENOUS
  Filled 2023-11-11: qty 100

## 2023-11-11 SURGICAL SUPPLY — 23 items
BAG DRAIN URO TABLE W/ADPT NS (BAG) ×1 IMPLANT
BAG HAMPER (MISCELLANEOUS) ×1 IMPLANT
CATH INTERMIT  6FR 70CM (CATHETERS) ×1 IMPLANT
CLOTH BEACON ORANGE TIMEOUT ST (SAFETY) ×1 IMPLANT
EXTRACTOR STONE NITINOL NGAGE (UROLOGICAL SUPPLIES) ×1 IMPLANT
GLOVE BIO SURGEON STRL SZ8 (GLOVE) ×1 IMPLANT
GLOVE BIOGEL PI IND STRL 7.0 (GLOVE) ×2 IMPLANT
GOWN STRL REUS W/TWL LRG LVL3 (GOWN DISPOSABLE) ×1 IMPLANT
GOWN STRL REUS W/TWL XL LVL3 (GOWN DISPOSABLE) ×1 IMPLANT
GUIDEWIRE ANG ZIPWIRE 038X150 (WIRE) ×1 IMPLANT
GUIDEWIRE STR DUAL SENSOR (WIRE) ×1 IMPLANT
KIT TURNOVER CYSTO (KITS) ×1 IMPLANT
MANIFOLD NEPTUNE II (INSTRUMENTS) ×1 IMPLANT
PACK CYSTO (CUSTOM PROCEDURE TRAY) ×1 IMPLANT
PAD ARMBOARD POSITIONER FOAM (MISCELLANEOUS) ×1 IMPLANT
POSITIONER HEAD 8X9X4 ADT (SOFTGOODS) ×1 IMPLANT
SHEATH URETERAL 12FRX35CM (MISCELLANEOUS) IMPLANT
SOL .9 NS 3000ML IRR UROMATIC (IV SOLUTION) ×2 IMPLANT
STENT URET 6FRX26 CONTOUR (STENTS) IMPLANT
SYR 10ML LL (SYRINGE) ×1 IMPLANT
TOWEL OR 17X26 4PK STRL BLUE (TOWEL DISPOSABLE) ×1 IMPLANT
TRACTIP FLEXIVA PULS ID 200XHI (Laser) IMPLANT
WATER STERILE IRR 500ML POUR (IV SOLUTION) ×1 IMPLANT

## 2023-11-11 NOTE — Op Note (Signed)
.  Preoperative diagnosis: Left UPJ stone  Postoperative diagnosis: Same  Procedure: 1 cystoscopy 2. Left retrograde pyelography 3.  Intraoperative fluoroscopy, under one hour, with interpretation 4.  Left ureteroscopic stone manipulation with laser lithotripsy 5.  Left 6 x 26 JJ stent placement  Attending: Thomes Flicker  Anesthesia: General  Estimated blood loss: None  Drains: Left 6 x 26 JJ ureteral stent with tether  Specimens: stone for analysis  Antibiotics: Ancef  Findings: left UPJ stone. moderate hydronephrosis. No masses/lesions in the bladder. Ureteral orifices in normal anatomic location.  Indications: Patient is a 73 year old male with a history of left UPJ stone and who has persistent left flank pain.  After discussing treatment options, he decided proceed with left ureteroscopic stone manipulation.  Procedure in detail: The patient was brought to the operating room and a brief timeout was done to ensure correct patient, correct procedure, correct site.  General anesthesia was administered patient was placed in dorsal lithotomy position.  Her genitalia was then prepped and draped in usual sterile fashion.  A rigid 22 French cystoscope was passed in the urethra and the bladder.  Bladder was inspected free masses or lesions.  the ureteral orifices were in the normal orthotopic locations.  a 6 french ureteral catheter was then instilled into the left ureteral orifice.  a gentle retrograde was obtained and findings noted above.  we then placed a zip wire through the ureteral catheter and advanced up to the renal pelvis.  we then removed the cystoscope and cannulated the left ureteral orifice with a semirigid ureteroscope.  No stone was found in the ureter. Once we reached the UPJ a sensor wire was advanced in to the renal pelvis. We then removed the ureteroscope and advanced am 12/14 x 35cm access sheath up to the renal pelvis. We then used the flexible ureteroscope to perform  nephroscopy. We encountered the stone at the UPJ. Using a 242nm laser fiber the stone was dusted.    the larger fragments were then removed with a Ngage basket.    once all stone fragments were removed we then removed the access sheath under direct vision and noted no injury to the ureter. We then placed a 6 x 26 double-j ureteral stent over the original zip wire.  We then removed the wire and good coil was noted in the the renal pelvis under fluoroscopy and the bladder under direct vision. the bladder was then drained and this concluded the procedure which was well tolerated by patient.  Complications: None  Condition: Stable, extubated, transferred to PACU  Plan: Patient is to be discharged home as to follow-up in one week. He is to remove his stent in 72 hours by pulling the tether

## 2023-11-11 NOTE — Progress Notes (Signed)
 Patient up to bathroom

## 2023-11-11 NOTE — Transfer of Care (Signed)
 Immediate Anesthesia Transfer of Care Note  Patient: Stephen Stephenson  Procedure(s) Performed: CYSTOSCOPY/URETEROSCOPY/HOLMIUM LASER/STENT PLACEMENT (Left: Ureter) HOLMIUM LASER APPLICATION (Left: Ureter)  Patient Location: PACU  Anesthesia Type:General  Level of Consciousness: awake  Airway & Oxygen Therapy: Patient Spontanous Breathing and Patient connected to face mask oxygen  Post-op Assessment: Report given to RN and Post -op Vital signs reviewed and stable  Post vital signs: Reviewed and stable  Last Vitals:  Vitals Value Taken Time  BP 136/85 11/11/23 0847  Temp 98.6   Pulse 96 11/11/23 0848  Resp 16 11/11/23 0848  SpO2 93 % 11/11/23 0848  Vitals shown include unfiled device data.  Last Pain:  Vitals:   11/11/23 1610  TempSrc: Oral  PainSc: 0-No pain         Complications: No notable events documented.

## 2023-11-11 NOTE — Anesthesia Procedure Notes (Addendum)
 Procedure Name: Intubation Date/Time: 11/11/2023 7:46 AM  Performed by: Sherwin Donate, CRNAPre-anesthesia Checklist: Patient identified, Emergency Drugs available, Suction available and Patient being monitored Patient Re-evaluated:Patient Re-evaluated prior to induction Oxygen Delivery Method: Circle system utilized Preoxygenation: Pre-oxygenation with 100% oxygen Induction Type: IV induction Ventilation: Oral airway inserted - appropriate to patient size and Mask ventilation without difficulty Laryngoscope Size: 3 and Miller Grade View: Grade I Tube type: Oral Tube size: 7.5 mm Number of attempts: 1 Airway Equipment and Method: Stylet Placement Confirmation: ETT inserted through vocal cords under direct vision, positive ETCO2 and breath sounds checked- equal and bilateral Secured at: 24 cm Tube secured with: Tape Dental Injury: Teeth and Oropharynx as per pre-operative assessment

## 2023-11-11 NOTE — Anesthesia Postprocedure Evaluation (Signed)
 Anesthesia Post Note  Patient: OZZIE HINO  Procedure(s) Performed: CYSTOSCOPY/URETEROSCOPY/HOLMIUM LASER/STENT PLACEMENT (Left: Ureter) HOLMIUM LASER APPLICATION (Left: Ureter)  Patient location during evaluation: PACU Anesthesia Type: General Level of consciousness: awake and alert Pain management: pain level controlled Vital Signs Assessment: post-procedure vital signs reviewed and stable Respiratory status: spontaneous breathing, nonlabored ventilation, respiratory function stable and patient connected to nasal cannula oxygen Cardiovascular status: blood pressure returned to baseline and stable Postop Assessment: no apparent nausea or vomiting Anesthetic complications: no   There were no known notable events for this encounter.   Last Vitals:  Vitals:   11/11/23 0638 11/11/23 0847  BP: (!) 151/87 136/85  Pulse: 94 95  Resp: 13 16  Temp: 36.5 C 37 C  SpO2: 96% 95%    Last Pain:  Vitals:   11/11/23 0847  TempSrc:   PainSc: 0-No pain                 Arienna Benegas L Emrey Thornley

## 2023-11-11 NOTE — H&P (Signed)
 HPI: [name] is a 73yo here for evaluation of nephrolithiasis. He developed left flank pain 5 days ago. He presented to the ER Monday and was diagnosed with a 11mm left UPJ calculus. UA shows 3-10 RBCs/hpf. Currently he has sharp, intermittent mild to moderate left flank pain.      PMH:     Past Medical History:  Diagnosis Date   DM (diabetes mellitus) (HCC)      type II   GERD (gastroesophageal reflux disease)     Hb-AS genotype (HCC)     Hepatitis C      genotype 1 eradicated   History of kidney stones     History of pulmonary embolus (PE) 2022   Hypertension     IBS (irritable bowel syndrome)     Insomnia     NASH (nonalcoholic steatohepatitis)      Metavir F3, 10/2013, patient reports completing hepatitis a and B vaccination. 2016   Renal disorder            Surgical History:      Past Surgical History:  Procedure Laterality Date   BACK SURGERY       CHOLECYSTECTOMY       COLONOSCOPY   02/05/2002    anal canal herrhoids ow normal/normal ileum/suspect pt bled from hemorrhoids presently   COLONOSCOPY   09/27/2011    ZOX:WRUEAVWU hemorrhoids. Hepatic flexure polyp-removed TUBULAR ADENOMA, due for surveillance March 2016   COLONOSCOPY N/A 01/20/2015    JWJ:XBJYNW colonic polyp removed/colonic diverticulosis   COLONOSCOPY N/A 07/05/2020    single 3 mm polyp at the rectosigmoid colon and a 7 mm innocent appearing AVM at the base of the cecum, otherwise normal.  Surgical pathology found the polyp to be hyperplastic.  Recommended no future colonoscopy unless new symptoms develop.   CYSTOSCOPY W/ URETERAL STENT PLACEMENT Left 02/03/2013    Procedure: CYSTOSCOPY WITH RETROGRADE PYELOGRAM/URETERAL STENT PLACEMENT;  Surgeon: Reggie Caper, MD;  Location: [facility];  Service: Urology;  Laterality: Left;   ESOPHAGOGASTRODUODENOSCOPY   05/17/2005    normal esophagus/gastric mucosa appeared normal   ESOPHAGOGASTRODUODENOSCOPY N/A 04/25/2014    Abnormal gastric mucosa, benign  biopsies. No esophageal varices. Next EGD October 2018.   ESOPHAGOGASTRODUODENOSCOPY (EGD) WITH PROPOFOL  N/A 01/08/2018    nonerosive reflux esophagitis status post Maloney dilation, portal hypertensive gastropathy, normal duodenum. Surveillance was due 2021   ESOPHAGOGASTRODUODENOSCOPY (EGD) WITH PROPOFOL  N/A 09/24/2023    Procedure: ESOPHAGOGASTRODUODENOSCOPY (EGD) WITH PROPOFOL ;  Surgeon: Suzette Espy, MD;  Location: [facility];  Service: Endoscopy;  Laterality: N/A;  10:45 am, asa 3   EXTRACORPOREAL SHOCK WAVE LITHOTRIPSY Left 02/03/2013    Procedure: EXTRACORPOREAL SHOCK WAVE LITHOTRIPSY (ESWL) LEFT RENAL CALCULUS;  Surgeon: Reggie Caper, MD;  Location: [facility];  Service: Urology;  Laterality: Left;   KIDNEY STONE SURGERY       MALONEY DILATION N/A 01/08/2018    Procedure: Londa Rival DILATION;  Surgeon: Suzette Espy, MD;  Location: [facility];  Service: Endoscopy;  Laterality: N/A;   POLYPECTOMY   07/05/2020    Procedure: POLYPECTOMY;  Surgeon: Suzette Espy, MD;  Location: [facility];  Service: Endoscopy;;  recto-sigmoid     SHOULDER SURGERY              Home Medications:  Allergies as of 10/08/2023   No Known Allergies         Medication List           Accurate as of October 08, 2023  1:56 PM. If you have any questions, ask your nurse or doctor.              apixaban  2.5 MG Tabs tablet Commonly known as: ELIQUIS  Take 2.5 mg by mouth 2 (two) times daily.    HYDROcodone -acetaminophen  5-325 MG tablet Commonly known as: NORCO/VICODIN Take 1 tablet by mouth every 6 (six) hours as needed for severe pain (pain score 7-10).    olmesartan 40 MG tablet Commonly known as: BENICAR Take 20 mg by mouth daily.    pantoprazole  40 MG tablet Commonly known as: PROTONIX  Take 1 tablet by mouth once daily    potassium citrate 10 MEQ (1080 MG) SR tablet Commonly known as: UROCIT-K Take 10 mEq by mouth 2 (two) times daily.    simethicone  80 MG chewable  tablet Commonly known as: Gas-X Chew 1 tablet (80 mg total) by mouth every 6 (six) hours as needed for flatulence.    triamcinolone  cream 0.1 % Commonly known as: KENALOG  Apply 1 application topically 2 (two) times daily. APPLY CREAM EXTERNALLY TWICE DAILY FOR UP TO 2 CONTINUOUS WEEKS THEN TAKE A 2 WEEK BREAK BEFORE RESTARTING.    Trijardy XR 5-2.11-998 MG Tb24 Generic drug: Empagliflozin-Linaglip-Metform Take 1 tablet by mouth 2 (two) times daily.    Zenpep  16109-604540 units Cpep Generic drug: Pancrelipase  (Lip-Prot-Amyl) Take by mouth. Takes 2 with meals. Gets#360 for 90 days. Gets through patient assistance.             Allergies:  Allergies  No Known Allergies     Family History:      Family History  Problem Relation Age of Onset   Liver disease Father          ? Hepatitis C; Alcoholic   Colon cancer Neg Hx            Social History:  reports that he quit smoking about 28 years ago. His smoking use included cigarettes. He started smoking about 53 years ago. He has a 25 pack-year smoking history. He quit smokeless tobacco use about 31 years ago. He reports that he does not currently use alcohol. He reports that he does not use drugs.   ROS: All other review of systems were reviewed and are negative except what is noted above in HPI   Physical Exam: BP 116/74   Pulse 90   Constitutional:  Alert and oriented, No acute distress. HEENT: [place] AT, moist mucus membranes.  Trachea midline, no masses. Cardiovascular: No clubbing, cyanosis, or edema. Respiratory: Normal respiratory effort, no increased work of breathing. GI: Abdomen is soft, nontender, nondistended, no abdominal masses GU: No CVA tenderness.  Lymph: No cervical or inguinal lymphadenopathy. Skin: No rashes, bruises or suspicious lesions. Neurologic: Grossly intact, no focal deficits, moving all 4 extremities. Psychiatric: Normal mood and affect.   Laboratory Data: Recent Labs       Lab Results   Component Value Date    WBC 12.1 (H) 10/06/2023    HGB 17.6 (H) 10/06/2023    HCT 50.7 10/06/2023    MCV 88.6 10/06/2023    PLT 122 (L) 10/06/2023        Recent Labs       Lab Results  Component Value Date    CREATININE 1.86 (H) 10/06/2023        Recent Labs  No results found for: "PSA"     Recent Labs  No results found for: "TESTOSTERONE"     Recent Labs  Lab Results  Component Value Date    HGBA1C 7.4 (H) 10/10/2020        Urinalysis Labs (Brief)          Component Value Date/Time    COLORURINE YELLOW 10/06/2023 0915    APPEARANCEUR CLEAR 10/06/2023 0915    LABSPEC 1.030 10/06/2023 0915    PHURINE 5.0 10/06/2023 0915    GLUCOSEU >=500 (A) 10/06/2023 0915    HGBUR MODERATE (A) 10/06/2023 0915    BILIRUBINUR NEGATIVE 10/06/2023 0915    KETONESUR NEGATIVE 10/06/2023 0915    PROTEINUR NEGATIVE 10/06/2023 0915    UROBILINOGEN 0.2 01/20/2013 2215    NITRITE NEGATIVE 10/06/2023 0915    LEUKOCYTESUR NEGATIVE 10/06/2023 0915        Recent Labs       Lab Results  Component Value Date    BACTERIA NONE SEEN 10/06/2023        Pertinent Imaging: CT 10/06/2023: Images reviewed and discussed with the patient  Results for orders placed during the hospital encounter of 02/03/13   DG Abd 1 View - KUB   Narrative *RADIOLOGY REPORT*   Clinical Data: Pre lithotripsy.   ABDOMEN - 1 VIEW   Comparison: 01/28/2013   Findings: Small calcifications project over the lower pole of the left kidney, stable.  Vascular calcifications and phleboliths in the pelvis.  No additional suspicious calcification. Nonobstructive bowel gas pattern.  No free air or acute bony abnormality.   IMPRESSION: Left lower pole nephrolithiasis.     Original Report Authenticated By: Janeece Mechanic, M.D.   No results found for this or any previous visit.   No results found for this or any previous visit.   No results found for this or any previous visit.   Results for orders  placed during the hospital encounter of 05/06/14   US  Renal   Narrative CLINICAL DATA:  Kidney stones.   EXAM: RENAL/URINARY TRACT ULTRASOUND COMPLETE   COMPARISON:  Ultrasound of October 21, 2013.   FINDINGS: Right Kidney:   Length: 11.6 cm. Echogenicity within normal limits. No mass or hydronephrosis visualized.   Left Kidney:   Length: 12 cm. At least 2 calculi are noted in the lower pole of the left kidney, with the largest measuring 6 mm. Echogenicity within normal limits. No mass or hydronephrosis visualized.   Bladder:   Appears normal for degree of bladder distention.   IMPRESSION: Nonobstructive left nephrolithiasis. No other renal abnormality noted.     Electronically Signed By: Collie Days M.D. On: 05/06/2014 10:11   No results found for this or any previous visit.   No results found for this or any previous visit.   Results for orders placed during the hospital encounter of 10/06/23   CT Renal Stone Study   Narrative CLINICAL DATA:  Abdominal/flank pain, stone suspected L flank pain, Hx of stones, but also lifted heavy objects four days ago   EXAM: CT ABDOMEN AND PELVIS WITHOUT CONTRAST   TECHNIQUE: Multidetector CT imaging of the abdomen and pelvis was performed following the standard protocol without IV contrast.   RADIATION DOSE REDUCTION: This exam was performed according to the departmental dose-optimization program which includes automated exposure control, adjustment of the mA and/or kV according to patient size and/or use of iterative reconstruction technique.   COMPARISON:  CT scan abdomen and pelvis from 04/16/2022.   FINDINGS: Lower chest: There are scattered emphysematous blebs throughout visualized bilateral lungs. There are subpleural atelectatic changes in the visualized lung bases. No overt consolidation.  No pleural effusion. The heart is normal in size. No pericardial effusion.   Hepatobiliary: The liver is normal in  size. Non-cirrhotic configuration. No suspicious mass. There are multiple hypoattenuating structures throughout the liver with largest in the left hepatic lobe, segment 4B measuring up to 1.6 x 2.2 cm, grossly similar to the prior study. No intrahepatic or extrahepatic bile duct dilation. Gallbladder is surgically absent.   Pancreas: Unremarkable. No pancreatic ductal dilatation or surrounding inflammatory changes.   Spleen: Size within normal limits. Redemonstration of a 1.6 x 2.1 cm simple cyst along the anterolateral aspect.   Adrenals/Urinary Tract: Adrenal glands are unremarkable. No suspicious renal mass within the limitations of this unenhanced exam. There is a 1.0 x 1.4 cm sinus cyst in the right kidney upper pole. There is a 1.1 x 1.9 cm left ureteropelvic junction calculus causing mild-to-moderate proximal hydronephrosis. There are additional multiple calculi in the left kidney with largest in lower pole calyx measuring up to 5 x 8 mm. Left ureter distal to the calculus is unremarkable. No right nephroureterolithiasis or obstructive uropathy. Urinary bladder is under distended, precluding optimal assessment. However, no large mass or stones identified. No perivesical fat stranding.   Stomach/Bowel: No disproportionate dilation of the small or large bowel loops. No evidence of abnormal bowel wall thickening or inflammatory changes. The appendix is unremarkable. There are scattered diverticula mainly in the sigmoid colon, without imaging signs of diverticulitis.   Vascular/Lymphatic: No ascites or pneumoperitoneum. No abdominal or pelvic lymphadenopathy, by size criteria. No aneurysmal dilation of the major abdominal arteries. There are mild peripheral atherosclerotic vascular calcifications of the aorta and its major branches.   Reproductive: Enlarged prostate. Symmetric seminal vesicles.   Other: There is a small fat containing left inguinal hernia. The soft tissues  and abdominal wall are otherwise unremarkable.   Musculoskeletal: No suspicious osseous lesions. There are mild - moderate multilevel degenerative changes in the visualized spine.   IMPRESSION: 1. There is a 1.1 x 1.9 cm left ureteropelvic junction calculus causing mild-to-moderate proximal hydronephrosis. There are additional multiple left renal calculi. No right nephroureterolithiasis or obstructive uropathy. 2. Multiple other nonacute observations, as described above. 3. Aortic atherosclerosis.   Aortic Atherosclerosis (ICD10-I70.0).     Electronically Signed By: Beula Brunswick M.D. On: 10/06/2023 11:19     Assessment & Plan:     1. Kidney stones (Primary) -We discussed the management of kidney stones. These options include observation, ureteroscopy, shockwave lithotripsy (ESWL) and percutaneous nephrolithotomy (PCNL). We discussed which options are relevant to the patient's stone(s). We discussed the natural history of kidney stones as well as the complications of untreated stones and the impact on quality of life without treatment as well as with each of the above listed treatments. We also discussed the efficacy of each treatment in its ability to clear the stone burden. With any of these management options I discussed the signs and symptoms of infection and the need for emergent treatment should these be experienced. For each option we discussed the ability of each procedure to clear the patient of their stone burden.   For observation I described the risks which include but are not limited to silent renal damage, life-threatening infection, need for emergent surgery, failure to pass stone and pain.   For ureteroscopy I described the risks which include bleeding, infection, damage to contiguous structures, positioning injury, ureteral stricture, ureteral avulsion, ureteral injury, need for prolonged ureteral stent, inability to perform ureteroscopy, need for an interval procedure,  inability  to clear stone burden, stent discomfort/pain, heart attack, stroke, pulmonary embolus and the inherent risks with general anesthesia.   For shockwave lithotripsy I described the risks which include arrhythmia, kidney contusion, kidney hemorrhage, need for transfusion, pain, inability to adequately break up stone, inability to pass stone fragments, Steinstrasse, infection associated with obstructing stones, need for alternate surgical procedure, need for repeat shockwave lithotripsy, MI, CVA, PE and the inherent risks with anesthesia/conscious sedation.   For PCNL I described the risks including positioning injury, pneumothorax, hydrothorax, need for chest tube, inability to clear stone burden, renal laceration, arterial venous fistula or malformation, need for embolization of kidney, loss of kidney or renal function, need for repeat procedure, need for prolonged nephrostomy tube, ureteral avulsion, MI, CVA, PE and the inherent risks of general anesthesia.   - The patient would like to proceed with left ureteroscopic stone extraction

## 2023-11-12 ENCOUNTER — Encounter (HOSPITAL_COMMUNITY): Payer: Self-pay | Admitting: Urology

## 2023-11-17 ENCOUNTER — Other Ambulatory Visit: Payer: Self-pay

## 2023-11-17 ENCOUNTER — Telehealth: Payer: Self-pay | Admitting: Urology

## 2023-11-17 DIAGNOSIS — N200 Calculus of kidney: Secondary | ICD-10-CM

## 2023-11-17 MED ORDER — ONDANSETRON HCL 4 MG PO TABS: 4.0000 mg | ORAL_TABLET | Freq: Three times a day (TID) | ORAL | 0 refills | Status: DC | PRN

## 2023-11-17 MED ORDER — KETOROLAC TROMETHAMINE 10 MG PO TABS
10.0000 mg | ORAL_TABLET | Freq: Four times a day (QID) | ORAL | 0 refills | Status: DC | PRN
Start: 2023-11-17 — End: 2024-05-10

## 2023-11-17 NOTE — Telephone Encounter (Signed)
 FYI and advise

## 2023-11-17 NOTE — Telephone Encounter (Signed)
 The pharmacy has requested a refill of Ketoralac, due to post surgical symptoms do you recommend a refill?

## 2023-11-17 NOTE — Telephone Encounter (Signed)
 Patient called in this morning and expressed that he is experience a lot of pain after pulling the stent. Patient is having vomiting and unable to keep anything down. Pain medication oxycodone  that was prescribe is not helping with patient pain. Patient is having nausea and needs some Zofran . Patient is aware a message will be sent to provider and someone will reach out with provider's response. Patient voiced understanding.

## 2023-11-17 NOTE — Telephone Encounter (Signed)
 Per Dr. Claretta Croft, ok to refill Toradol  and send in Zofran  for nausea and vomiting. Patient made aware.

## 2023-11-17 NOTE — Telephone Encounter (Signed)
 Patient called the office today in regard to surgery questions and/or concerns.  Patient surgery date? 11/10/24 Type of surgery? Stent and kidney stone removal  What is best contact number to reach the patient at? [phone] Were you given pain medication prescription at time of discharge? Yes  What Symptoms is patient experiencing? Fever- No Nausea- Yes Vomiting- Yes Hematuria- No Unable to void: No   Patient having extreme pain and vomiting , unable to keep his medication down.  They removed the stent on Friday and the pain and vomiting has gotten worse

## 2023-11-18 ENCOUNTER — Ambulatory Visit: Admitting: Gastroenterology

## 2023-11-19 ENCOUNTER — Ambulatory Visit: Admitting: Urology

## 2023-11-19 LAB — STONE ANALYSIS
Calcium Oxalate Monohydrate: 20 %
Uric Acid Calculi: 80 %
Weight Calculi: 31 mg

## 2023-11-26 ENCOUNTER — Encounter: Payer: Self-pay | Admitting: Urology

## 2023-11-26 ENCOUNTER — Ambulatory Visit: Admitting: Urology

## 2023-11-26 ENCOUNTER — Ambulatory Visit (HOSPITAL_COMMUNITY)
Admission: RE | Admit: 2023-11-26 | Discharge: 2023-11-26 | Disposition: A | Discharge status: Home / Self Care | Source: Ambulatory Visit | Attending: Urology | Admitting: Urology

## 2023-11-26 VITALS — BP 146/89 | HR 93

## 2023-11-26 DIAGNOSIS — N200 Calculus of kidney: Secondary | ICD-10-CM

## 2023-11-26 DIAGNOSIS — Z87442 Personal history of urinary calculi: Secondary | ICD-10-CM | POA: Diagnosis not present

## 2023-11-26 DIAGNOSIS — Z09 Encounter for follow-up examination after completed treatment for conditions other than malignant neoplasm: Secondary | ICD-10-CM

## 2023-11-26 DIAGNOSIS — I878 Other specified disorders of veins: Secondary | ICD-10-CM | POA: Diagnosis not present

## 2023-11-26 NOTE — Progress Notes (Signed)
 11/26/2023 10:34 AM   Stephen Stephenson 01-Mar-1951 [id number]  Referring provider: Minus Amel, MD [address] [place],  [place] [address]  Followup nephrolithiasis   HPI: Stephen Stephenson is a 73yo here for followup for nephrolithiasis. He underwent left ureteroscopic stone extraction and then removed it POD#3. He developed flank pain after stent removal. He denies any flank pain currently. KUB shows no calculi. Stone composition Uric acid. He is on sodium bicarb currently.    PMH: Past Medical History:  Diagnosis Date   DM (diabetes mellitus) (HCC)    type II   GERD (gastroesophageal reflux disease)    Hb-AS genotype (HCC)    Hepatitis C    genotype 1 eradicated   History of kidney stones    History of pulmonary embolus (PE) 2022   Hypertension    IBS (irritable bowel syndrome)    Insomnia    NASH (nonalcoholic steatohepatitis)    Metavir F3, 10/2013, patient reports completing hepatitis a and B vaccination. 2016   Renal disorder     Surgical History: Past Surgical History:  Procedure Laterality Date   BACK SURGERY     CHOLECYSTECTOMY     COLONOSCOPY  02/05/2002   anal canal herrhoids ow normal/normal ileum/suspect pt bled from hemorrhoids presently   COLONOSCOPY  09/27/2011   GUY:QIHKVQQV hemorrhoids. Hepatic flexure polyp-removed TUBULAR ADENOMA, due for surveillance March 2016   COLONOSCOPY N/A 01/20/2015   ZDG:LOVFIE colonic polyp removed/colonic diverticulosis   COLONOSCOPY N/A 07/05/2020   single 3 mm polyp at the rectosigmoid colon and a 7 mm innocent appearing AVM at the base of the cecum, otherwise normal.  Surgical pathology found the polyp to be hyperplastic.  Recommended no future colonoscopy unless new symptoms develop.   CYSTOSCOPY W/ URETERAL STENT PLACEMENT Left 02/03/2013   Procedure: CYSTOSCOPY WITH RETROGRADE PYELOGRAM/URETERAL STENT PLACEMENT;  Surgeon: Reggie Caper, MD;  Location: [facility];  Service: Urology;  Laterality: Left;    CYSTOSCOPY/URETEROSCOPY/HOLMIUM LASER/STENT PLACEMENT Left 11/11/2023   Procedure: CYSTOSCOPY/URETEROSCOPY/HOLMIUM LASER/STENT PLACEMENT;  Surgeon: Marco Severs, MD;  Location: [facility];  Service: Urology;  Laterality: Left;   ESOPHAGOGASTRODUODENOSCOPY  05/17/2005   normal esophagus/gastric mucosa appeared normal   ESOPHAGOGASTRODUODENOSCOPY N/A 04/25/2014   Abnormal gastric mucosa, benign biopsies. No esophageal varices. Next EGD October 2018.   ESOPHAGOGASTRODUODENOSCOPY (EGD) WITH PROPOFOL  N/A 01/08/2018   nonerosive reflux esophagitis status post Maloney dilation, portal hypertensive gastropathy, normal duodenum. Surveillance was due 2021   ESOPHAGOGASTRODUODENOSCOPY (EGD) WITH PROPOFOL  N/A 09/24/2023   Procedure: ESOPHAGOGASTRODUODENOSCOPY (EGD) WITH PROPOFOL ;  Surgeon: Suzette Espy, MD;  [facility]: [facility];  Service: Endoscopy;  Laterality: N/A;  10:45 am, asa 3   EXTRACORPOREAL SHOCK WAVE LITHOTRIPSY Left 02/03/2013   Procedure: EXTRACORPOREAL SHOCK WAVE LITHOTRIPSY (ESWL) LEFT RENAL CALCULUS;  Surgeon: Reggie Caper, MD;  Location: [facility];  Service: Urology;  Laterality: Left;   EXTRACORPOREAL SHOCK WAVE LITHOTRIPSY Left 10/14/2023   Procedure: LITHOTRIPSY, ESWL;  Surgeon: Marco Severs, MD;  Location: [facility];  Service: Urology;  Laterality: Left;   HOLMIUM LASER APPLICATION Left 11/11/2023   Procedure: HOLMIUM LASER APPLICATION;  Surgeon: Marco Severs, MD;  Location: [facility];  Service: Urology;  Laterality: Left;   KIDNEY STONE SURGERY     MALONEY DILATION N/A 01/08/2018   Procedure: Londa Rival DILATION;  Surgeon: Suzette Espy, MD;  [facility]: [facility];  Service: Endoscopy;  Laterality: N/A;   POLYPECTOMY  07/05/2020   Procedure: POLYPECTOMY;  Surgeon: Suzette Espy, MD;  [facility];  Service: Endoscopy;;  recto-sigmoid    SHOULDER SURGERY Right     Home Medications:  Allergies as of 11/26/2023   No Known Allergies      Medication  List        Accurate as of Nov 26, 2023 10:34 AM. If you have any questions, ask your nurse or doctor.          apixaban  2.5 MG Tabs tablet Commonly known as: ELIQUIS  Take 2.5 mg by mouth 2 (two) times daily.   Fish Oil 1000 MG Caps Take 1,000 mg by mouth daily.   glimepiride 4 MG tablet Commonly known as: AMARYL Take 2-4 mg by mouth 2 (two) times daily.   ketorolac  10 MG tablet Commonly known as: TORADOL  Take 1 tablet (10 mg total) by mouth every 6 (six) hours as needed.   metoprolol  succinate 25 MG 24 hr tablet Commonly known as: TOPROL -XL Take 1 tablet (25 mg total) by mouth daily.   olmesartan 40 MG tablet Commonly known as: BENICAR Take 20 mg by mouth daily.   ondansetron  4 MG tablet Commonly known as: Zofran  Take 1 tablet (4 mg total) by mouth every 8 (eight) hours as needed for nausea or vomiting.   oxyCODONE -acetaminophen  5-325 MG tablet Commonly known as: Percocet Take 1 tablet by mouth every 4 (four) hours as needed.   oxyCODONE -acetaminophen  5-325 MG tablet Commonly known as: PERCOCET/ROXICET Take 1 tablet by mouth every 8 (eight) hours as needed for severe pain (pain score 7-10).   pantoprazole  40 MG tablet Commonly known as: PROTONIX  Take 1 tablet by mouth once daily   potassium citrate 10 MEQ (1080 MG) SR tablet Commonly known as: UROCIT-K Take 10 mEq by mouth 2 (two) times daily.   simethicone  80 MG chewable tablet Commonly known as: Gas-X Chew 1 tablet (80 mg total) by mouth every 6 (six) hours as needed for flatulence.   sodium bicarbonate  650 MG tablet Take 1 tablet (650 mg total) by mouth 2 (two) times daily.   tamsulosin  0.4 MG Caps capsule Commonly known as: Flomax  Take 1 capsule (0.4 mg total) by mouth daily after supper.   Trijardy XR 5-2.11-998 MG Tb24 Generic drug: Empagliflozin-Linaglip-Metform Take 1 tablet by mouth 2 (two) times daily.   Vitamin D (Ergocalciferol) 1.25 MG (50000 UNIT) Caps capsule Commonly known as:  DRISDOL Take 50,000 Units by mouth every 7 (seven) days.   Zenpep  16109-604540 units Cpep Generic drug: Pancrelipase  (Lip-Prot-Amyl) Take 2 tablets by mouth 3 (three) times daily. . Gets#360 for 90 days. Gets through patient assistance.        Allergies: No Known Allergies  Family History: Family History  Problem Relation Age of Onset   Liver disease Father        ? Hepatitis C; Alcoholic   Colon cancer Neg Hx     Social History:  reports that he quit smoking about 28 years ago. His smoking use included cigarettes. He started smoking about 53 years ago. He has a 25 pack-year smoking history. He quit smokeless tobacco use about 31 years ago. He reports that he does not currently use alcohol. He reports that he does not use drugs.  ROS: All other review of systems were reviewed and are negative except what is noted above in HPI  Physical Exam: BP (!) 146/89   Pulse 93   Constitutional:  Alert and oriented, No acute distress. HEENT: [place] AT, moist mucus membranes.  Trachea midline, no masses. Cardiovascular: No clubbing, cyanosis, or  edema. Respiratory: Normal respiratory effort, no increased work of breathing. GI: Abdomen is soft, nontender, nondistended, no abdominal masses GU: No CVA tenderness.  Lymph: No cervical or inguinal lymphadenopathy. Skin: No rashes, bruises or suspicious lesions. Neurologic: Grossly intact, no focal deficits, moving all 4 extremities. Psychiatric: Normal mood and affect.  Laboratory Data: Lab Results  Component Value Date   WBC 5.9 10/14/2023   HGB 16.5 10/14/2023   HCT 47.4 10/14/2023   MCV 88.3 10/14/2023   PLT 137 (L) 10/14/2023    Lab Results  Component Value Date   CREATININE 1.56 (H) 10/14/2023    No results found for: "PSA"  No results found for: "TESTOSTERONE"  Lab Results  Component Value Date   HGBA1C 7.4 (H) 10/10/2020    Urinalysis    Component Value Date/Time   COLORURINE YELLOW 10/06/2023 0915   APPEARANCEUR  Clear 10/08/2023 1329   LABSPEC 1.030 10/06/2023 0915   PHURINE 5.0 10/06/2023 0915   GLUCOSEU 3+ (A) 10/08/2023 1329   HGBUR MODERATE (A) 10/06/2023 0915   BILIRUBINUR Negative 10/08/2023 1329   KETONESUR NEGATIVE 10/06/2023 0915   PROTEINUR Negative 10/08/2023 1329   PROTEINUR NEGATIVE 10/06/2023 0915   UROBILINOGEN 0.2 01/20/2013 2215   NITRITE Negative 10/08/2023 1329   NITRITE NEGATIVE 10/06/2023 0915   LEUKOCYTESUR Negative 10/08/2023 1329   LEUKOCYTESUR NEGATIVE 10/06/2023 0915    Lab Results  Component Value Date   LABMICR See below: 10/08/2023   WBCUA 0-5 10/08/2023   LABEPIT 0-10 10/08/2023   BACTERIA None seen 10/08/2023    Pertinent Imaging: KUb today: Images reviewed and discussed with the patient  Results for orders placed during the hospital encounter of 10/14/23  DG Abd 1 View  Narrative CLINICAL DATA:  Left kidney stones.  EXAM: ABDOMEN - 1 VIEW  COMPARISON:  X-ray 10/08/2023.  CT 10/06/2023.  FINDINGS: Once again there is a 21 mm calcification overlying the left renal pelvis or proximal ureter seen on CT. Additional small tubal stones overlying the left reniform shadow. No definite abnormal calcifications overlying the right kidney. The punctate calcifications in the pelvis very well could be vascular.  Gas seen in nondilated loops of small and large bowel. Scattered colonic stool. Degenerative changes along the spine.  IMPRESSION: Multiple left-sided renal stones. Larger calcifications seen medial to left kidney which could be in the proximal ureter or distal renal pelvis as seen on prior CT scan   Electronically Signed By: Adrianna Horde M.D. On: 10/14/2023 10:35  Results for orders placed during the hospital encounter of 10/14/23  US  Venous Img Lower Bilateral  Narrative CLINICAL DATA:  Bilateral lower extremity pain.  EXAM: BILATERAL LOWER EXTREMITY VENOUS DOPPLER ULTRASOUND  TECHNIQUE: Gray-scale sonography with graded  compression, as well as color Doppler and duplex ultrasound were performed to evaluate the lower extremity deep venous systems from the level of the common femoral vein and including the common femoral, femoral, profunda femoral, popliteal and calf veins including the posterior tibial, peroneal and gastrocnemius veins when visible. The superficial great saphenous vein was also interrogated. Spectral Doppler was utilized to evaluate flow at rest and with distal augmentation maneuvers in the common femoral, femoral and popliteal veins.  COMPARISON:  None Available.  FINDINGS: RIGHT LOWER EXTREMITY  Common Femoral Vein: No evidence of thrombus. Normal compressibility, respiratory phasicity and response to augmentation.  Saphenofemoral Junction: No evidence of thrombus. Normal compressibility and flow on color Doppler imaging.  Profunda Femoral Vein: No evidence of thrombus. Normal compressibility and flow on color  Doppler imaging.  Femoral Vein: No evidence of thrombus. Normal compressibility, respiratory phasicity and response to augmentation.  Popliteal Vein: No evidence of thrombus. Normal compressibility, respiratory phasicity and response to augmentation.  Calf Veins: No evidence of thrombus. Normal compressibility and flow on color Doppler imaging.  Superficial Great Saphenous Vein: No evidence of thrombus. Normal compressibility.  Venous Reflux:  None.  Other Findings: No evidence of superficial thrombophlebitis or abnormal fluid collection.  LEFT LOWER EXTREMITY  Common Femoral Vein: No evidence of thrombus. Normal compressibility, respiratory phasicity and response to augmentation.  Saphenofemoral Junction: No evidence of thrombus. Normal compressibility and flow on color Doppler imaging.  Profunda Femoral Vein: No evidence of thrombus. Normal compressibility and flow on color Doppler imaging.  Femoral Vein: No evidence of thrombus. Normal  compressibility, respiratory phasicity and response to augmentation.  Popliteal Vein: No evidence of thrombus. Normal compressibility, respiratory phasicity and response to augmentation.  Calf Veins: No evidence of thrombus. Normal compressibility and flow on color Doppler imaging.  Superficial Great Saphenous Vein: No evidence of thrombus. Normal compressibility.  Venous Reflux:  None.  Other Findings: No evidence of superficial thrombophlebitis or abnormal fluid collection.  IMPRESSION: No evidence of deep venous thrombosis in either lower extremity.   Electronically Signed By: Erica Hau M.D. On: 10/14/2023 11:33  No results found for this or any previous visit.  No results found for this or any previous visit.  Results for orders placed during the hospital encounter of 05/06/14  US  Renal  Narrative CLINICAL DATA:  Kidney stones.  EXAM: RENAL/URINARY TRACT ULTRASOUND COMPLETE  COMPARISON:  Ultrasound of October 21, 2013.  FINDINGS: Right Kidney:  Length: 11.6 cm. Echogenicity within normal limits. No mass or hydronephrosis visualized.  Left Kidney:  Length: 12 cm. At least 2 calculi are noted in the lower pole of the left kidney, with the largest measuring 6 mm. Echogenicity within normal limits. No mass or hydronephrosis visualized.  Bladder:  Appears normal for degree of bladder distention.  IMPRESSION: Nonobstructive left nephrolithiasis. No other renal abnormality noted.   Electronically Signed By: Collie Days M.D. On: 05/06/2014 10:11  No results found for this or any previous visit.  No results found for this or any previous visit.  Results for orders placed during the hospital encounter of 10/06/23  CT Renal Stone Study  Narrative CLINICAL DATA:  Abdominal/flank pain, stone suspected L flank pain, Hx of stones, but also lifted heavy objects four days ago  EXAM: CT ABDOMEN AND PELVIS WITHOUT  CONTRAST  TECHNIQUE: Multidetector CT imaging of the abdomen and pelvis was performed following the standard protocol without IV contrast.  RADIATION DOSE REDUCTION: This exam was performed according to the departmental dose-optimization program which includes automated exposure control, adjustment of the mA and/or kV according to patient size and/or use of iterative reconstruction technique.  COMPARISON:  CT scan abdomen and pelvis from 04/16/2022.  FINDINGS: Lower chest: There are scattered emphysematous blebs throughout visualized bilateral lungs. There are subpleural atelectatic changes in the visualized lung bases. No overt consolidation. No pleural effusion. The heart is normal in size. No pericardial effusion.  Hepatobiliary: The liver is normal in size. Non-cirrhotic configuration. No suspicious mass. There are multiple hypoattenuating structures throughout the liver with largest in the left hepatic lobe, segment 4B measuring up to 1.6 x 2.2 cm, grossly similar to the prior study. No intrahepatic or extrahepatic bile duct dilation. Gallbladder is surgically absent.  Pancreas: Unremarkable. No pancreatic ductal dilatation or surrounding inflammatory changes.  Spleen: Size within normal limits. Redemonstration of a 1.6 x 2.1 cm simple cyst along the anterolateral aspect.  Adrenals/Urinary Tract: Adrenal glands are unremarkable. No suspicious renal mass within the limitations of this unenhanced exam. There is a 1.0 x 1.4 cm sinus cyst in the right kidney upper pole. There is a 1.1 x 1.9 cm left ureteropelvic junction calculus causing mild-to-moderate proximal hydronephrosis. There are additional multiple calculi in the left kidney with largest in lower pole calyx measuring up to 5 x 8 mm. Left ureter distal to the calculus is unremarkable. No right nephroureterolithiasis or obstructive uropathy. Urinary bladder is under distended, precluding optimal assessment. However,  no large mass or stones identified. No perivesical fat stranding.  Stomach/Bowel: No disproportionate dilation of the small or large bowel loops. No evidence of abnormal bowel wall thickening or inflammatory changes. The appendix is unremarkable. There are scattered diverticula mainly in the sigmoid colon, without imaging signs of diverticulitis.  Vascular/Lymphatic: No ascites or pneumoperitoneum. No abdominal or pelvic lymphadenopathy, by size criteria. No aneurysmal dilation of the major abdominal arteries. There are mild peripheral atherosclerotic vascular calcifications of the aorta and its major branches.  Reproductive: Enlarged prostate. Symmetric seminal vesicles.  Other: There is a small fat containing left inguinal hernia. The soft tissues and abdominal wall are otherwise unremarkable.  Musculoskeletal: No suspicious osseous lesions. There are mild - moderate multilevel degenerative changes in the visualized spine.  IMPRESSION: 1. There is a 1.1 x 1.9 cm left ureteropelvic junction calculus causing mild-to-moderate proximal hydronephrosis. There are additional multiple left renal calculi. No right nephroureterolithiasis or obstructive uropathy. 2. Multiple other nonacute observations, as described above. 3. Aortic atherosclerosis.  Aortic Atherosclerosis (ICD10-I70.0).   Electronically Signed By: Beula Brunswick M.D. On: 10/06/2023 11:19   Assessment & Plan:    1. Kidney stones (Primary) -continue sodium bicarbonate  -followup 8 weeks with a renal US  - DG Abd 1 View   No follow-ups on file.  Johnie Nailer, MD  [facility] [place]

## 2023-11-26 NOTE — Patient Instructions (Signed)

## 2023-12-04 ENCOUNTER — Ambulatory Visit (HOSPITAL_COMMUNITY)
Admission: RE | Admit: 2023-12-04 | Discharge: 2023-12-04 | Disposition: A | Discharge status: Home / Self Care | Source: Ambulatory Visit | Attending: Urology | Admitting: Urology

## 2023-12-04 DIAGNOSIS — N200 Calculus of kidney: Secondary | ICD-10-CM | POA: Insufficient documentation

## 2024-01-12 DIAGNOSIS — G720 Drug-induced myopathy: Secondary | ICD-10-CM | POA: Diagnosis not present

## 2024-01-12 DIAGNOSIS — I2699 Other pulmonary embolism without acute cor pulmonale: Secondary | ICD-10-CM | POA: Diagnosis not present

## 2024-01-12 DIAGNOSIS — E782 Mixed hyperlipidemia: Secondary | ICD-10-CM | POA: Diagnosis not present

## 2024-01-12 DIAGNOSIS — D6869 Other thrombophilia: Secondary | ICD-10-CM | POA: Diagnosis not present

## 2024-01-12 DIAGNOSIS — E1140 Type 2 diabetes mellitus with diabetic neuropathy, unspecified: Secondary | ICD-10-CM | POA: Diagnosis not present

## 2024-01-15 ENCOUNTER — Telehealth: Payer: Self-pay | Admitting: Urology

## 2024-01-15 DIAGNOSIS — N200 Calculus of kidney: Secondary | ICD-10-CM | POA: Diagnosis not present

## 2024-01-15 NOTE — Telephone Encounter (Signed)
 Appt updated.  Please notify patient.

## 2024-01-15 NOTE — Telephone Encounter (Signed)
 Appt scheduled when McKenzie is off. He wants to be seen sometime in August prefers morning appointment

## 2024-01-26 DIAGNOSIS — I10 Essential (primary) hypertension: Secondary | ICD-10-CM | POA: Diagnosis not present

## 2024-01-26 DIAGNOSIS — I493 Ventricular premature depolarization: Secondary | ICD-10-CM | POA: Diagnosis not present

## 2024-01-26 DIAGNOSIS — Z0001 Encounter for general adult medical examination with abnormal findings: Secondary | ICD-10-CM | POA: Diagnosis not present

## 2024-01-26 DIAGNOSIS — E559 Vitamin D deficiency, unspecified: Secondary | ICD-10-CM | POA: Diagnosis not present

## 2024-01-26 DIAGNOSIS — Z6829 Body mass index (BMI) 29.0-29.9, adult: Secondary | ICD-10-CM | POA: Diagnosis not present

## 2024-01-26 DIAGNOSIS — E1140 Type 2 diabetes mellitus with diabetic neuropathy, unspecified: Secondary | ICD-10-CM | POA: Diagnosis not present

## 2024-01-26 DIAGNOSIS — D6869 Other thrombophilia: Secondary | ICD-10-CM | POA: Diagnosis not present

## 2024-01-26 DIAGNOSIS — E782 Mixed hyperlipidemia: Secondary | ICD-10-CM | POA: Diagnosis not present

## 2024-01-26 DIAGNOSIS — E6609 Other obesity due to excess calories: Secondary | ICD-10-CM | POA: Diagnosis not present

## 2024-01-26 DIAGNOSIS — B182 Chronic viral hepatitis C: Secondary | ICD-10-CM | POA: Diagnosis not present

## 2024-01-26 DIAGNOSIS — Z1331 Encounter for screening for depression: Secondary | ICD-10-CM | POA: Diagnosis not present

## 2024-02-12 DIAGNOSIS — E782 Mixed hyperlipidemia: Secondary | ICD-10-CM | POA: Diagnosis not present

## 2024-02-12 DIAGNOSIS — D6869 Other thrombophilia: Secondary | ICD-10-CM | POA: Diagnosis not present

## 2024-02-12 DIAGNOSIS — I2699 Other pulmonary embolism without acute cor pulmonale: Secondary | ICD-10-CM | POA: Diagnosis not present

## 2024-02-23 ENCOUNTER — Ambulatory Visit: Admitting: Urology

## 2024-03-04 ENCOUNTER — Ambulatory Visit: Admitting: Gastroenterology

## 2024-03-08 ENCOUNTER — Ambulatory Visit: Admitting: Urology

## 2024-03-08 ENCOUNTER — Other Ambulatory Visit: Payer: Self-pay

## 2024-03-08 ENCOUNTER — Telehealth: Payer: Self-pay

## 2024-03-08 DIAGNOSIS — N200 Calculus of kidney: Secondary | ICD-10-CM

## 2024-03-08 NOTE — Telephone Encounter (Signed)
 Patient had ultra sound done eight days after last appointment.  Needing to have another appointment made.  No available appointments currently.  Please advise.

## 2024-03-11 DIAGNOSIS — E1169 Type 2 diabetes mellitus with other specified complication: Secondary | ICD-10-CM | POA: Diagnosis not present

## 2024-03-25 DIAGNOSIS — H25813 Combined forms of age-related cataract, bilateral: Secondary | ICD-10-CM | POA: Diagnosis not present

## 2024-03-25 DIAGNOSIS — E119 Type 2 diabetes mellitus without complications: Secondary | ICD-10-CM | POA: Diagnosis not present

## 2024-03-25 DIAGNOSIS — H01001 Unspecified blepharitis right upper eyelid: Secondary | ICD-10-CM | POA: Diagnosis not present

## 2024-03-25 DIAGNOSIS — H01002 Unspecified blepharitis right lower eyelid: Secondary | ICD-10-CM | POA: Diagnosis not present

## 2024-04-20 ENCOUNTER — Ambulatory Visit: Admitting: Gastroenterology

## 2024-04-20 VITALS — BP 131/82 | HR 103 | Temp 98.2°F | Ht 72.0 in | Wt 213.8 lb

## 2024-04-20 DIAGNOSIS — K7400 Hepatic fibrosis, unspecified: Secondary | ICD-10-CM

## 2024-04-20 DIAGNOSIS — R197 Diarrhea, unspecified: Secondary | ICD-10-CM

## 2024-04-20 DIAGNOSIS — R140 Abdominal distension (gaseous): Secondary | ICD-10-CM | POA: Diagnosis not present

## 2024-04-20 MED ORDER — DICYCLOMINE HCL 10 MG PO CAPS: 10.0000 mg | ORAL_CAPSULE | Freq: Three times a day (TID) | ORAL | 1 refills | Status: DC

## 2024-04-20 NOTE — Patient Instructions (Signed)
 Please go to [facility] today or tomorrow morning to have the xray done.  I have ordered blood work for [facility]. I have also ordered stool tests to complete.  Don't start taking the dicyclomine  yet. I will let you know if this is needed.  Avoid dairy for right now and follow a lower fat, soft diet.  Further recommendations following the labs and imaging!  I will see you in 3 months regardless! I suspect we may have to do investigation of your small intestine either with a little camera or a CT>  I enjoyed seeing you again today! I value our relationship and want to provide genuine, compassionate, and quality care. You may receive a survey regarding your visit with me, and I welcome your feedback! Thanks so much for taking the time to complete this. I look forward to seeing you again.      Therisa MICAEL Stager, PhD, ANP-BC [facility]

## 2024-04-20 NOTE — Progress Notes (Signed)
 Gastroenterology Office Note     Primary Care Physician:  Marvine Rush, MD  Primary Gastroenterologist: Dr. Shaaron    Chief Complaint   Chief Complaint  Patient presents with   Follow-up    Follow up IBS. Pt has had 3 days of diarrhea and gas     History of Present Illness   Stephen Stephenson is a 73 y.o. male presenting today with a history of Hep C s/p eradication and increased fibrosis in past (Metavir 3) now with serial ultrasounds, chronic GERD, intermittent, chronic diarrhea with improvement on pancreatic enzymes, returning for follow-up. Last seen in Jan 2025.   Chronic diarrhea: celiac serologies negative in 2015, Xifaxan  and dicyclomine  without improvement in the past   Zenpep  has been working very well since last visit until 3 days ago, then stared having foul burps, diarrhea 6-7 times per day. Urgency. No rectal bleeding. No recent abx. No changes in meds. April had renal stone. Gets increased gas with this. Denies any significant abdominal bloating. Sometimes with these episodes has abdominal pain but not this time. If laying down, will have worsening reflux. Rare nausea with this.  Prior to this would have BM daily  Has increased Zenpep  60k to 2 capsules recently with food to see if it would help. Takign imodium, pepto.     EGD March 2025: normal esophagus, portal gastropathy, normal duodenum.   Colonoscopy up-to-date as of 2021 and no further colonoscopy recommended unless new symptoms develop.    US  July 2024: diffuse increased texture of liver, splenoemgaly,  Known thrombocytopenia  Past Medical History:  Diagnosis Date   DM (diabetes mellitus) (HCC)    type II   GERD (gastroesophageal reflux disease)    Hb-AS genotype    Hepatitis C    genotype 1 eradicated   History of kidney stones    History of pulmonary embolus (PE) 2022   Hypertension    IBS (irritable bowel syndrome)    Insomnia    NASH (nonalcoholic steatohepatitis)    Metavir F3, 10/2013,  patient reports completing hepatitis a and B vaccination. 2016   Renal disorder     Past Surgical History:  Procedure Laterality Date   BACK SURGERY     CHOLECYSTECTOMY     COLONOSCOPY  02/05/2002   anal canal herrhoids ow normal/normal ileum/suspect pt bled from hemorrhoids presently   COLONOSCOPY  09/27/2011   MFM:[id number] hemorrhoids. Hepatic flexure polyp-removed TUBULAR ADENOMA, due for surveillance March 2016   COLONOSCOPY N/A 01/20/2015   MFM:[id number] colonic polyp removed/colonic diverticulosis   COLONOSCOPY N/A 07/05/2020   single 3 mm polyp at the rectosigmoid colon and a 7 mm innocent appearing AVM at the base of the cecum, otherwise normal.  Surgical pathology found the polyp to be hyperplastic.  Recommended no future colonoscopy unless new symptoms develop.   CYSTOSCOPY W/ URETERAL STENT PLACEMENT Left 02/03/2013   Procedure: CYSTOSCOPY WITH RETROGRADE PYELOGRAM/URETERAL STENT PLACEMENT;  Surgeon: Emery LILLETTE Blaze, MD;  Location: [facility];  Service: Urology;  Laterality: Left;   CYSTOSCOPY/URETEROSCOPY/HOLMIUM LASER/STENT PLACEMENT Left 11/11/2023   Procedure: CYSTOSCOPY/URETEROSCOPY/HOLMIUM LASER/STENT PLACEMENT;  Surgeon: Sherrilee Belvie CROME, MD;  Location: [facility];  Service: Urology;  Laterality: Left;   ESOPHAGOGASTRODUODENOSCOPY  05/17/2005   normal esophagus/gastric mucosa appeared normal   ESOPHAGOGASTRODUODENOSCOPY N/A 04/25/2014   Abnormal gastric mucosa, benign biopsies. No esophageal varices. Next EGD October 2018.   ESOPHAGOGASTRODUODENOSCOPY (EGD) WITH PROPOFOL  N/A 01/08/2018   nonerosive reflux esophagitis status post Maloney dilation, portal hypertensive gastropathy, normal duodenum.  Surveillance was due 2021   ESOPHAGOGASTRODUODENOSCOPY (EGD) WITH PROPOFOL  N/A 09/24/2023   Procedure: ESOPHAGOGASTRODUODENOSCOPY (EGD) WITH PROPOFOL ;  Surgeon: Shaaron Lamar HERO, MD;  Location: [facility];  Service: Endoscopy;  Laterality: N/A;  10:45 am, asa 3   EXTRACORPOREAL SHOCK  WAVE LITHOTRIPSY Left 02/03/2013   Procedure: EXTRACORPOREAL SHOCK WAVE LITHOTRIPSY (ESWL) LEFT RENAL CALCULUS;  Surgeon: Emery LILLETTE Blaze, MD;  Location: [facility];  Service: Urology;  Laterality: Left;   EXTRACORPOREAL SHOCK WAVE LITHOTRIPSY Left 10/14/2023   Procedure: LITHOTRIPSY, ESWL;  Surgeon: Sherrilee Belvie CROME, MD;  Location: [facility];  Service: Urology;  Laterality: Left;   HOLMIUM LASER APPLICATION Left 11/11/2023   Procedure: HOLMIUM LASER APPLICATION;  Surgeon: Sherrilee Belvie CROME, MD;  Location: [facility];  Service: Urology;  Laterality: Left;   KIDNEY STONE SURGERY     MALONEY DILATION N/A 01/08/2018   Procedure: AGAPITO DILATION;  Surgeon: Shaaron Lamar HERO, MD;  Location: [facility];  Service: Endoscopy;  Laterality: N/A;   POLYPECTOMY  07/05/2020   Procedure: POLYPECTOMY;  Surgeon: Shaaron Lamar HERO, MD;  Location: [facility];  Service: Endoscopy;;  recto-sigmoid    SHOULDER SURGERY Right     Current Outpatient Medications  Medication Sig Dispense Refill   apixaban  (ELIQUIS ) 2.5 MG TABS tablet Take 2.5 mg by mouth 2 (two) times daily.     glimepiride (AMARYL) 4 MG tablet Take 2-4 mg by mouth 2 (two) times daily.     metoprolol  succinate (TOPROL -XL) 25 MG 24 hr tablet Take 1 tablet (25 mg total) by mouth daily. 30 tablet 2   olmesartan (BENICAR) 40 MG tablet Take 20 mg by mouth daily.     Omega-3 Fatty Acids (FISH OIL) 1000 MG CAPS Take 1,000 mg by mouth daily.     Pancrelipase , Lip-Prot-Amyl, (ZENPEP ) 60000-189600 units CPEP Take 2 tablets by mouth 3 (three) times daily. . Gets#360 for 90 days. Gets through patient assistance.     pantoprazole  (PROTONIX ) 40 MG tablet Take 1 tablet by mouth once daily 90 tablet 3   simethicone  (GAS-X) 80 MG chewable tablet Chew 1 tablet (80 mg total) by mouth every 6 (six) hours as needed for flatulence. 30 tablet 0   sodium bicarbonate  650 MG tablet Take 1 tablet (650 mg total) by mouth 2 (two) times daily. 60 tablet 11   tamsulosin  (FLOMAX ) 0.4  MG CAPS capsule Take 1 capsule (0.4 mg total) by mouth daily after supper. 30 capsule 0   TRIJARDY XR 5-2.11-998 MG TB24 Take 1 tablet by mouth 2 (two) times daily.     Vitamin D, Ergocalciferol, (DRISDOL) 1.25 MG (50000 UNIT) CAPS capsule Take 50,000 Units by mouth every 7 (seven) days.     ketorolac  (TORADOL ) 10 MG tablet Take 1 tablet (10 mg total) by mouth every 6 (six) hours as needed. (Patient not taking: Reported on 04/20/2024) 30 tablet 0   ondansetron  (ZOFRAN ) 4 MG tablet Take 1 tablet (4 mg total) by mouth every 8 (eight) hours as needed for nausea or vomiting. (Patient not taking: Reported on 04/20/2024) 20 tablet 0   oxyCODONE -acetaminophen  (PERCOCET) 5-325 MG tablet Take 1 tablet by mouth every 4 (four) hours as needed. (Patient not taking: Reported on 11/07/2023) 30 tablet 0   oxyCODONE -acetaminophen  (PERCOCET/ROXICET) 5-325 MG tablet Take 1 tablet by mouth every 8 (eight) hours as needed for severe pain (pain score 7-10). (Patient not taking: Reported on 04/20/2024) 30 tablet 0   potassium citrate (UROCIT-K) 10 MEQ (1080 MG) SR tablet Take 10  mEq by mouth 2 (two) times daily.  (Patient not taking: Reported on 04/20/2024)     No current facility-administered medications for this visit.    Allergies as of 04/20/2024   (No Known Allergies)    Family History  Problem Relation Age of Onset   Liver disease Father        ? Hepatitis C; Alcoholic   Colon cancer Neg Hx     Social History   Socioeconomic History   Marital status: Married    Spouse name: Not on file   Number of children: Not on file   Years of education: Not on file   Highest education level: Not on file  Occupational History   Not on file  Tobacco Use   Smoking status: Former    Current packs/day: 0.00    Average packs/day: 1 pack/day for 25.0 years (25.0 ttl pk-yrs)    Types: Cigarettes    Start date: 08/01/1970    Quit date: 08/02/1995    Years since quitting: 28.7   Smokeless tobacco: Former    Quit date:  10/12/1992  Vaping Use   Vaping status: Never Used  Substance and Sexual Activity   Alcohol use: Not Currently   Drug use: No   Sexual activity: Yes    Birth control/protection: None  Other Topics Concern   Not on file  Social History Narrative   ** Merged History Encounter **       Social Drivers of Health   Financial Resource Strain: Not on file  Food Insecurity: No Food Insecurity (02/18/2022)   Hunger Vital Sign    Worried About Running Out of Food in the Last Year: Never true    Ran Out of Food in the Last Year: Never true  Transportation Needs: No Transportation Needs (02/18/2022)   PRAPARE - Administrator, Civil Service (Medical): No    Lack of Transportation (Non-Medical): No  Physical Activity: Not on file  Stress: Not on file  Social Connections: Unknown (11/26/2021)   [facility]   Social Network    Social Network: Not on file  Intimate Partner Violence: Unknown (10/18/2021)   Received from [facility]   HITS    Physically Hurt: Not on file    Insult or Talk Down To: Not on file    Threaten Physical Harm: Not on file    Scream or Curse: Not on file     Review of Systems   Gen: Denies any fever, chills, fatigue, weight loss, lack of appetite.  CV: Denies chest pain, heart palpitations, peripheral edema, syncope.  Resp: Denies shortness of breath at rest or with exertion. Denies wheezing or cough.  GI: Denies dysphagia or odynophagia. Denies jaundice, hematemesis, fecal incontinence. GU : Denies urinary burning, urinary frequency, urinary hesitancy MS: Denies joint pain, muscle weakness, cramps, or limitation of movement.  Derm: Denies rash, itching, dry skin Psych: Denies depression, anxiety, memory loss, and confusion Heme: Denies bruising, bleeding, and enlarged lymph nodes.   Physical Exam   BP 131/82   Pulse (!) 103   Temp 98.2 F (36.8 C)   Ht 6' (1.829 m)   Wt 213 lb 12.8 oz (97 kg)   BMI 29.00 kg/m  General:    Alert and oriented. Pleasant and cooperative. Well-nourished and well-developed.  Head:  Normocephalic and atraumatic. Eyes:  Without icterus Abdomen:  +BS, distended, hyperactive, tympanitic bowel sounds, no TTP Rectal:  Deferred  Msk:  Symmetrical without gross deformities. Normal  posture. Extremities:  Without edema. Neurologic:  Alert and  oriented x4;  grossly normal neurologically. Skin:  Intact without significant lesions or rashes. Psych:  Alert and cooperative. Normal mood and affect.   Assessment   Stephen Stephenson is a 73 y.o. male presenting today with a history of Hep C s/p eradication and increased fibrosis in past (Metavir 3), suspected EPI, GERD, now with acute on chronic diarrhea.    Acute on chronic Diarrhea: had done remarkably well with Zenpep , previously failing Xifaxan  and dicyclomine . Celiac serologies negative in the past. Will check stool studies to rule out superimposed infectious process. AAS as he notes increased bloating, burping, need to r/o any ileus. Labs also ordered along with inflammatory markers. Depending on findings will then recommend further steps. Doubt dealing with underlying IBD. May need updated colonoscopy with random colonic biopsies. Bile salt diarrhea, IBS, EPI contributing and interesting he had done so remarkably well with pancreatic enzymes.   Hepatic fibrosis: s/p Hep C with documented eradication. Serial US , AFP. Suspect dealing with underlying early cirrhosis as well. EGD on file March 2025 with portal gastropathy but no varices. Continue surveillance.     PLAN    Check CBC, CMP, CRP, sed rate, celiac, fecal cal, stool studies Abdominal xray stat Depending on labs may need CTE vs CT Consider dicyclomine  if all above negative, avoid dairy, follow soft diet 3 month follow-up regardless    Therisa MICAEL Stager, PhD, ANP-BC [facility]

## 2024-04-21 ENCOUNTER — Ambulatory Visit (HOSPITAL_COMMUNITY)
Admission: RE | Admit: 2024-04-21 | Discharge: 2024-04-21 | Disposition: A | Discharge status: Home / Self Care | Source: Ambulatory Visit | Attending: Gastroenterology | Admitting: Gastroenterology

## 2024-04-21 DIAGNOSIS — R197 Diarrhea, unspecified: Secondary | ICD-10-CM | POA: Diagnosis not present

## 2024-04-21 DIAGNOSIS — K7400 Hepatic fibrosis, unspecified: Secondary | ICD-10-CM | POA: Diagnosis not present

## 2024-04-21 DIAGNOSIS — R140 Abdominal distension (gaseous): Secondary | ICD-10-CM | POA: Insufficient documentation

## 2024-04-21 DIAGNOSIS — R109 Unspecified abdominal pain: Secondary | ICD-10-CM | POA: Diagnosis not present

## 2024-04-22 ENCOUNTER — Ambulatory Visit: Payer: Self-pay | Admitting: Gastroenterology

## 2024-04-22 ENCOUNTER — Ambulatory Visit: Admitting: Nurse Practitioner

## 2024-04-22 NOTE — Progress Notes (Unsigned)
 Subjective:  CC: Anger, Irritability, Frustration    HPI: Stephen Stephenson is a 73 y.o. male presenting on 04/22/2024  presents in office for psychiatric evaluation and medication management.   ROS: Negative unless specifically indicated above in HPI.   Relevant past medical history reviewed and updated as indicated.   Allergies and medications reviewed and updated. Sleep depravation gets about three hours of sleep.  Current Outpatient Medications  Medication Instructions  . apixaban  (ELIQUIS ) 2.5 mg, 2 times daily  . dicyclomine  (BENTYL ) 10 mg, Oral, 3 times daily before meals & bedtime, For abdominal cramping and looser stool  . Fish Oil 1,000 mg, Daily  . glimepiride (AMARYL) 2-4 mg, 2 times daily  . ketorolac  (TORADOL ) 10 mg, Oral, Every 6 hours PRN  . metoprolol  succinate (TOPROL -XL) 25 mg, Oral, Daily  . olmesartan (BENICAR) 20 mg, Daily  . ondansetron  (ZOFRAN ) 4 mg, Oral, Every 8 hours PRN  . oxyCODONE -acetaminophen  (PERCOCET) 5-325 MG tablet 1 tablet, Oral, Every 4 hours PRN  . oxyCODONE -acetaminophen  (PERCOCET/ROXICET) 5-325 MG tablet 1 tablet, Oral, Every 8 hours PRN  . Pancrelipase , Lip-Prot-Amyl, (ZENPEP ) 60000-189600 units CPEP 2 tablets, 3 times daily  . pantoprazole  (PROTONIX ) 40 MG tablet Take 1 tablet by mouth once daily  . potassium citrate (UROCIT-K) 10 MEQ (1080 MG) SR tablet 10 mEq, 2 times daily  . simethicone  (GAS-X) 80 mg, Oral, Every 6 hours PRN  . sodium bicarbonate  650 mg, Oral, 2 times daily  . tamsulosin  (FLOMAX ) 0.4 mg, Oral, Daily after supper  . TRIJARDY XR 5-2.11-998 MG TB24 1 tablet, 2 times daily  . Vitamin D (Ergocalciferol) (DRISDOL) 50,000 Units, Every 7 days     No Known Allergies  Objective:   There were no vitals taken for this visit.   Physical Exam Constitutional:      Appearance: Normal appearance.  HENT:     Head: Normocephalic.  Eyes:     Conjunctiva/sclera: Conjunctivae normal.  Cardiovascular:     Rate and Rhythm:  Normal rate and regular rhythm.  Pulmonary:     Effort: Pulmonary effort is normal.     Breath sounds: Normal breath sounds.  Skin:    General: Skin is warm and dry.  Neurological:     General: No focal deficit present.     Mental Status: He is alert and oriented to person, place, and time.  Psychiatric:        Mood and Affect: Mood normal.        Behavior: Behavior normal.        Thought Content: Thought content normal.        Judgment: Judgment normal.     Eye Contact:  {BHH EYE CONTACT:22684}  Speech:  {Speech:22685}  Volume:  {Volume (PAA):22686}  Mood:  {BHH MOOD:22306}  Affect:  {Affect (PAA):22687}  Thought Process:  {Thought Process (PAA):22688}  Orientation:  {BHH ORIENTATION (PAA):22689}  Thought Content:  {Thought Content:22690}  Suicidal Thoughts:  {ST/HT (PAA):22692}  Homicidal Thoughts:  {ST/HT (PAA):22692}  Memory:  {BHH MEMORY:22881}  Judgement:  {Judgement (PAA):22694}  Insight:  {Insight (PAA):22695}  Psychomotor Activity:  {Psychomotor (PAA):22696}  Concentration:  {Concentration:21399}  Recall:  {BHH GOOD/FAIR/POOR:22877}  Fund of Knowledge:  {BHH GOOD/FAIR/POOR:22877}  Language:  {BHH GOOD/FAIR/POOR:22877}  Akathisia:  {BHH YES OR NO:22294}  Handed:  {Handed:22697}  AIMS (if indicated):     Assets:  {Assets (PAA):22698}  ADL's:  {BHH JIO'D:77709}  Cognition:  {chl bhh cognition:304700322}  Sleep:        Assessment & Plan:  Assessment & Plan     Follow up plan: No follow-ups on file.  Florencia Cousin, NP

## 2024-04-23 DIAGNOSIS — R197 Diarrhea, unspecified: Secondary | ICD-10-CM | POA: Diagnosis not present

## 2024-04-25 LAB — COMPREHENSIVE METABOLIC PANEL WITH GFR
ALT: 33 IU/L (ref 0–44)
AST: 33 IU/L (ref 0–40)
Albumin: 4.4 g/dL (ref 3.8–4.8)
Alkaline Phosphatase: 48 IU/L (ref 47–123)
BUN/Creatinine Ratio: 11 (ref 10–24)
BUN: 16 mg/dL (ref 8–27)
Bilirubin Total: 0.6 mg/dL (ref 0.0–1.2)
CO2: 18 mmol/L — ABNORMAL LOW (ref 20–29)
Calcium: 9.8 mg/dL (ref 8.6–10.2)
Chloride: 103 mmol/L (ref 96–106)
Creatinine, Ser: 1.46 mg/dL — ABNORMAL HIGH (ref 0.76–1.27)
Globulin, Total: 2.5 g/dL (ref 1.5–4.5)
Glucose: 160 mg/dL — ABNORMAL HIGH (ref 70–99)
Potassium: 4.3 mmol/L (ref 3.5–5.2)
Sodium: 140 mmol/L (ref 134–144)
Total Protein: 6.9 g/dL (ref 6.0–8.5)
eGFR: 50 mL/min/1.73 — ABNORMAL LOW (ref >59)

## 2024-04-25 LAB — CBC WITH DIFFERENTIAL/PLATELET
Basophils Absolute: 0 x10E3/uL (ref 0.0–0.2)
Basos: 1 %
EOS (ABSOLUTE): 0.3 x10E3/uL (ref 0.0–0.4)
Eos: 5 %
Hematocrit: 48.6 % (ref 37.5–51.0)
Hemoglobin: 16.4 g/dL (ref 13.0–17.7)
Immature Grans (Abs): 0.1 x10E3/uL (ref 0.0–0.1)
Immature Granulocytes: 1 %
Lymphocytes Absolute: 1.6 x10E3/uL (ref 0.7–3.1)
Lymphs: 25 %
MCH: 31.8 pg (ref 26.6–33.0)
MCHC: 33.7 g/dL (ref 31.5–35.7)
MCV: 94 fL (ref 79–97)
Monocytes Absolute: 0.6 x10E3/uL (ref 0.1–0.9)
Monocytes: 10 %
Neutrophils Absolute: 3.6 x10E3/uL (ref 1.4–7.0)
Neutrophils: 58 %
Platelets: 130 x10E3/uL — ABNORMAL LOW (ref 150–450)
RBC: 5.15 x10E6/uL (ref 4.14–5.80)
RDW: 13.6 % (ref 11.6–15.4)
WBC: 6.2 x10E3/uL (ref 3.4–10.8)

## 2024-04-25 LAB — SEDIMENTATION RATE: Sed Rate: 8 mm/h (ref 0–30)

## 2024-04-25 LAB — CLOSTRIDIUM DIFFICILE EIA: C difficile Toxins A+B, EIA: NEGATIVE

## 2024-04-25 LAB — CELIAC DISEASE PANEL
Endomysial IgA: NEGATIVE
IgA/Immunoglobulin A, Serum: 373 mg/dL (ref 61–437)
Transglutaminase IgA: <2 U/mL (ref 0–3)

## 2024-04-25 LAB — C-REACTIVE PROTEIN: CRP: 2 mg/L (ref 0–10)

## 2024-04-25 LAB — AFP TUMOR MARKER: AFP, Serum, Tumor Marker: 4.7 ng/mL (ref 0.0–8.4)

## 2024-04-27 LAB — GI PROFILE, STOOL, PCR

## 2024-04-27 LAB — CALPROTECTIN, FECAL: Calprotectin, Fecal: 24 ug/g (ref 0–120)

## 2024-05-03 DIAGNOSIS — H25812 Combined forms of age-related cataract, left eye: Secondary | ICD-10-CM | POA: Diagnosis not present

## 2024-05-05 ENCOUNTER — Encounter (HOSPITAL_COMMUNITY): Payer: Self-pay

## 2024-05-05 ENCOUNTER — Encounter (HOSPITAL_COMMUNITY)
Admission: RE | Admit: 2024-05-05 | Discharge: 2024-05-05 | Disposition: A | Discharge status: Home / Self Care | Source: Ambulatory Visit | Attending: Ophthalmology | Admitting: Ophthalmology

## 2024-05-05 NOTE — H&P (Signed)
 Surgical History & Physical  Patient Name: Stephen Stephenson  DOB: May 12, 1951  Surgery: Cataract extraction with intraocular lens implant phacoemulsification; Left Eye Surgeon: Lynwood Hermann MD Surgery Date: 05/10/2024 Pre-Op Date: 03/25/2024  HPI: A 49 Yr. old male patient 1. The patient is present today for Cataract Evaluation referred by Dr. Darroll. The patient complains of difficulty when recognizing people, which began 2 years ago. Both eyes are affected. The episode is constant. The patient describes foggy and hazy symptoms affecting their eyes/vision. This is negatively affecting the patient's quality of life and the patient is unable to function adequately in life with the current level of vision. HPI was performed by Lynwood Hermann .  Medical History: Cataracts  Diabetes - DM Type 2 Heart Problem  Review of Systems Cardiovascular hx blood clots Endocrine DMII All recorded systems are negative except as noted above.  Social Never smoked  Medication Prednisolone-moxiflox-bromfen,  Sodium bicarbonate , Glimepiride, Pantoprazole , Eliquis   Sx/Procedures Cholecystectomy  Drug Allergies  NKDA  History & Physical: Heent: cataracts NECK: supple without bruits LUNGS: lungs clear to auscultation CV: regular rate and rhythm Abdomen: soft and non-tender  Impression & Plan: Assessment: 1.  COMBINED FORMS AGE RELATED CATARACT; Both Eyes (H25.813) 2.  Diabetes Type 2 No retinopathy (E11.9) 3.  BLEPHARITIS; Right Upper Lid, Right Lower Lid, Left Upper Lid, Left Lower Lid (H01.001, H01.002,H01.004,H01.005) 4.  Pinguecula; Both Eyes (H11.153) 5.  ASTIGMATISM, REGULAR; Both Eyes (H52.223) 6.  Hyperopia; Both Eyes (H52.03)  Plan: 1.  Cataract accounts for the patient's decreased vision. This visual impairment is not correctable with a tolerable change in glasses or contact lenses. Cataract surgery with an implantation of a new lens should significantly improve the visual and  functional status of the patient. Recommend phacoemulsification with intraocular lens. Discussed all risks, benefits, alternatives, and potential complications. Discussed the procedures and recovery. The patient desires to have surgery. A-scan/Biometry ordered and will be performed for intraocular lens calculations. The surgery will be performed in order to improve vision for driving, reading, and for eye examinations. Recommend Dextenza  for post-operative pain and inflammation. Educational materials provided: Cataract. History of corneal refractive Surgery: None History of Previous Ocular Surgery (PPV, other): None History of ocular trauma: None Use of Eye Pressure Lowering Drops: None Pupil Status: Dilates poorly - shugarcaine or Lidocaine +Omidira by protocol, Malyugin Ring Diabetes status: No Retinopathy Left Eye worse. OS first, then tentatively schedule OD. Discussed Panoptix Pro Lens. Toric IOL OD.  2.  Stressed importance of blood sugar and blood pressure control, and also yearly eye examinations. Discussed the need for ongoing proactive ocular exams and treatment, hopefully before visual symptoms develop. Diabetic correspondence sent to PCP/endocrinologist today and/or within the past year.  3.  Blepharitis is present - recommend regular lid cleaning.  4.  Observe; Artificial tears as needed for irritation.  5.  Moderate OD - will likely recommend toric IOL.  6.  Diagnostic.

## 2024-05-10 ENCOUNTER — Encounter (HOSPITAL_COMMUNITY): Payer: Self-pay | Admitting: Ophthalmology

## 2024-05-10 ENCOUNTER — Ambulatory Visit (HOSPITAL_COMMUNITY): Admitting: Certified Registered"

## 2024-05-10 ENCOUNTER — Ambulatory Visit (HOSPITAL_COMMUNITY)
Admission: RE | Admit: 2024-05-10 | Discharge: 2024-05-10 | Disposition: A | Discharge status: Home / Self Care | Attending: Ophthalmology | Admitting: Ophthalmology

## 2024-05-10 ENCOUNTER — Encounter (HOSPITAL_COMMUNITY)
Admission: RE | Disposition: A | Discharge status: Home / Self Care | Payer: Self-pay | Source: Home / Self Care | Attending: Ophthalmology

## 2024-05-10 DIAGNOSIS — Z87891 Personal history of nicotine dependence: Secondary | ICD-10-CM | POA: Diagnosis not present

## 2024-05-10 DIAGNOSIS — H25812 Combined forms of age-related cataract, left eye: Secondary | ICD-10-CM | POA: Insufficient documentation

## 2024-05-10 DIAGNOSIS — I10 Essential (primary) hypertension: Secondary | ICD-10-CM | POA: Diagnosis not present

## 2024-05-10 DIAGNOSIS — E1136 Type 2 diabetes mellitus with diabetic cataract: Secondary | ICD-10-CM

## 2024-05-10 DIAGNOSIS — H5712 Ocular pain, left eye: Secondary | ICD-10-CM | POA: Diagnosis not present

## 2024-05-10 DIAGNOSIS — R000 Tachycardia, unspecified: Secondary | ICD-10-CM | POA: Diagnosis not present

## 2024-05-10 HISTORY — PX: CATARACT EXTRACTION W/PHACO: SHX586

## 2024-05-10 HISTORY — PX: INSERTION, STENT, DRUG-ELUTING, LACRIMAL CANALICULUS: SHX7453

## 2024-05-10 LAB — GLUCOSE, CAPILLARY: Glucose-Capillary: 197 mg/dL — ABNORMAL HIGH (ref 70–99)

## 2024-05-10 SURGERY — PHACOEMULSIFICATION, CATARACT, WITH IOL INSERTION
Anesthesia: General | Site: Eye | Laterality: Left

## 2024-05-10 MED ORDER — POVIDONE-IODINE 5 % OP SOLN
OPHTHALMIC | Status: DC | PRN
  Administered 2024-05-10: 1 via OPHTHALMIC

## 2024-05-10 MED ORDER — SODIUM HYALURONATE 23MG/ML IO SOSY
PREFILLED_SYRINGE | INTRAOCULAR | Status: DC | PRN
Start: 2024-05-10 — End: 2024-05-10
  Administered 2024-05-10: .6 mL via INTRAOCULAR

## 2024-05-10 MED ORDER — SODIUM HYALURONATE 10 MG/ML IO SOLUTION
PREFILLED_SYRINGE | INTRAOCULAR | Status: DC | PRN
Start: 2024-05-10 — End: 2024-05-10
  Administered 2024-05-10: .85 mL via INTRAOCULAR

## 2024-05-10 MED ORDER — BSS IO SOLN
INTRAOCULAR | Status: DC | PRN
  Administered 2024-05-10: 15 mL via INTRAOCULAR

## 2024-05-10 MED ORDER — DEXAMETHASONE 0.4 MG OP INST
VAGINAL_INSERT | OPHTHALMIC | Status: DC | PRN
  Administered 2024-05-10: .4 mg via OPHTHALMIC

## 2024-05-10 MED ORDER — LIDOCAINE HCL 3.5 % OP GEL
1.0000 | Freq: Once | OPHTHALMIC | Status: AC
  Administered 2024-05-10: 1 via OPHTHALMIC

## 2024-05-10 MED ORDER — TETRACAINE HCL 0.5 % OP SOLN
1.0000 [drp] | OPHTHALMIC | Status: AC | PRN
  Administered 2024-05-10 (×3): 1 [drp] via OPHTHALMIC

## 2024-05-10 MED ORDER — PHENYLEPHRINE HCL 2.5 % OP SOLN
1.0000 [drp] | OPHTHALMIC | Status: AC | PRN
  Administered 2024-05-10 (×3): 1 [drp] via OPHTHALMIC

## 2024-05-10 MED ORDER — LACTATED RINGERS IV SOLN: INTRAVENOUS | Status: DC

## 2024-05-10 MED ORDER — PHENYLEPHRINE-KETOROLAC 1-0.3 % IO SOLN
INTRAOCULAR | Status: DC | PRN
  Administered 2024-05-10: 500 mL via OPHTHALMIC

## 2024-05-10 MED ORDER — MOXIFLOXACIN HCL 5 MG/ML IO SOLN
INTRAOCULAR | Status: DC | PRN
  Administered 2024-05-10: .3 mL via INTRACAMERAL

## 2024-05-10 MED ORDER — DEXAMETHASONE 0.4 MG OP INST
VAGINAL_INSERT | OPHTHALMIC | Status: AC
  Filled 2024-05-10: qty 1

## 2024-05-10 MED ORDER — TROPICAMIDE 1 % OP SOLN
1.0000 [drp] | OPHTHALMIC | Status: AC | PRN
  Administered 2024-05-10 (×3): 1 [drp] via OPHTHALMIC

## 2024-05-10 MED ORDER — DEXAMETHASONE 0.4 MG OP INST
VAGINAL_INSERT | OPHTHALMIC | Status: AC
Start: 2024-05-10 — End: 2024-05-10
  Filled 2024-05-10: qty 1

## 2024-05-10 MED ORDER — LIDOCAINE HCL (PF) 1 % IJ SOLN
INTRAMUSCULAR | Status: DC | PRN
  Administered 2024-05-10: 1 mL

## 2024-05-10 MED ORDER — STERILE WATER FOR IRRIGATION IR SOLN
Status: DC | PRN
  Administered 2024-05-10: 1

## 2024-05-10 SURGICAL SUPPLY — 11 items
CLOTH BEACON ORANGE TIMEOUT ST (SAFETY) ×2 IMPLANT
EYE SHIELD UNIVERSAL CLEAR (GAUZE/BANDAGES/DRESSINGS) IMPLANT
FEE CATARACT SUITE SIGHTPATH (MISCELLANEOUS) ×2 IMPLANT
GLOVE BIOGEL PI IND STRL 7.0 (GLOVE) ×4 IMPLANT
LENS IOL TECNIS EYHANCE 22.0 (Intraocular Lens) IMPLANT
NDL HYPO 18GX1.5 BLUNT FILL (NEEDLE) ×2 IMPLANT
NEEDLE HYPO 18GX1.5 BLUNT FILL (NEEDLE) ×2 IMPLANT
PAD ARMBOARD POSITIONER FOAM (MISCELLANEOUS) ×2 IMPLANT
SYR TB 1ML LL NO SAFETY (SYRINGE) ×2 IMPLANT
TAPE SURG TRANSPORE 1 IN (GAUZE/BANDAGES/DRESSINGS) IMPLANT
WATER STERILE IRR 250ML POUR (IV SOLUTION) ×2 IMPLANT

## 2024-05-10 NOTE — Op Note (Signed)
 Date of procedure: 05/10/24  Pre-operative diagnosis: Visually significant age-related combined cataract, Left Eye (H25.812)  Post-operative diagnosis:  Visually significant age-related combined cataract, Left Eye (H25.812) 2.   Pain and inflammation following cataract surgery, Left Eye (H57.12)  Procedure:  Removal of cataract via phacoemulsification and insertion of intra-ocular lens [employer] DIB00 +22.0D into the capsular bag of the Left Eye 2. Placement of Dextenza  Implant, Left Lower Lid  Attending surgeon: Lynwood LABOR. Myalee Stengel, MD, MA  Anesthesia: MAC, Topical Akten  Complications: None  Estimated Blood Loss: <96mL (minimal)  Specimens: None  Implants: As above  Indications:  Visually significant age-related cataract, Left Eye  Procedure:  The patient was seen and identified in the pre-operative area. The operative eye was identified and dilated.  The operative eye was marked.  Topical anesthesia was administered to the operative eye.     The patient was then to the operative suite and placed in the supine position.  A timeout was performed confirming the patient, procedure to be performed, and all other relevant information.   The patient's face was prepped and draped in the usual fashion for intra-ocular surgery.  A lid speculum was placed into the operative eye and the surgical microscope moved into place and focused.  An inferotemporal paracentesis was created using a 20 gauge paracentesis blade. Omidria was injected into the anterior chamber. Shugarcaine was injected into the anterior chamber.  Viscoelastic was injected into the anterior chamber.  A temporal clear-corneal main wound incision was created using a 2.62mm microkeratome.  A continuous curvilinear capsulorrhexis was initiated using an irrigating cystitome and completed using capsulorrhexis forceps.  Hydrodissection and hydrodeliniation were performed.  Viscoelastic was injected into the anterior chamber.  A  phacoemulsification handpiece and a chopper as a second instrument were used to remove the nucleus and epinucleus. The irrigation/aspiration handpiece was used to remove any remaining cortical material.   The capsular bag was reinflated with viscoelastic, checked, and found to be intact.  The intraocular lens was inserted into the capsular bag.  The irrigation/aspiration handpiece was used to remove any remaining viscoelastic.  The clear corneal wound and paracentesis wounds were then hydrated and checked with Weck-Cels to be watertight.  0.1mL of moxifloxacin was injected into the anterior chamber.  The lid-speculum was removed. The lower punctum was dilated. A Dextenza  implant was placed in the lower canaliculus without complication.   The drape was removed.  The patient's face was cleaned with a wet and dry 4x4.   A clear shield was taped over the eye. The patient was taken to the post-operative care unit in good condition, having tolerated the procedure well.  Post-Op Instructions: The patient will follow up at [facility] for a same day post-operative evaluation and will receive all other orders and instructions.

## 2024-05-10 NOTE — Transfer of Care (Signed)
 Immediate Anesthesia Transfer of Care Note  Patient: Stephen Stephenson  Procedure(s) Performed: PHACOEMULSIFICATION, CATARACT, WITH IOL INSERTION (Left: Eye) INSERTION, STENT, DRUG-ELUTING, LACRIMAL CANALICULUS (Left)  Patient Location: Short Stay  Anesthesia Type:MAC  Level of Consciousness: awake, alert , oriented, and patient cooperative  Airway & Oxygen Therapy: Patient Spontanous Breathing  Post-op Assessment: Report given to RN and Post -op Vital signs reviewed and stable  Post vital signs: Reviewed and stable  Last Vitals:  Vitals Value Taken Time  BP 180/86   Temp 98.8   Pulse 86   Resp 16   SpO2 98     Last Pain:  Vitals:   05/10/24 1059  PainSc: 0-No pain         Complications: No notable events documented.

## 2024-05-10 NOTE — Interval H&P Note (Signed)
 History and Physical Interval Note:  05/10/2024 11:32 AM  Stephen Stephenson  has presented today for surgery, with the diagnosis of combined forms age related cataract, left eye.  The various methods of treatment have been discussed with the patient and family. After consideration of risks, benefits and other options for treatment, the patient has consented to  Procedure(s): PHACOEMULSIFICATION, CATARACT, WITH IOL INSERTION (Left) INSERTION, STENT, DRUG-ELUTING, LACRIMAL CANALICULUS (Left) as a surgical intervention.  The patient's history has been reviewed, patient examined, no change in status, stable for surgery.  I have reviewed the patient's chart and labs.  Questions were answered to the patient's satisfaction.     HARRIE AGENT

## 2024-05-10 NOTE — Anesthesia Preprocedure Evaluation (Addendum)
 Anesthesia Evaluation  Patient identified by MRN, date of birth, ID band Patient awake    Reviewed: Allergy & Precautions, H&P , NPO status , Patient's Chart, lab work & pertinent test results  Airway Mallampati: II  TM Distance: >3 FB Neck ROM: Full    Dental  (+) Upper Dentures   Pulmonary former smoker   Pulmonary exam normal breath sounds clear to auscultation       Cardiovascular hypertension, Normal cardiovascular exam Rhythm:Regular Rate:Tachycardia  Latest EKG shows bigeminy   Neuro/Psych negative neurological ROS  negative psych ROS   GI/Hepatic negative GI ROS, Neg liver ROS,,,  Endo/Other  diabetes    Renal/GU negative Renal ROS  negative genitourinary   Musculoskeletal negative musculoskeletal ROS (+)    Abdominal   Peds negative pediatric ROS (+)  Hematology negative hematology ROS (+)   Anesthesia Other Findings   Reproductive/Obstetrics negative OB ROS                              Anesthesia Physical Anesthesia Plan  ASA: 3  Anesthesia Plan: General   Post-op Pain Management:    Induction: Intravenous  PONV Risk Score and Plan:   Airway Management Planned: Nasal Cannula  Additional Equipment:   Intra-op Plan:   Post-operative Plan:   Informed Consent: I have reviewed the patients History and Physical, chart, labs and discussed the procedure including the risks, benefits and alternatives for the proposed anesthesia with the patient or authorized representative who has indicated his/her understanding and acceptance.     Dental advisory given  Plan Discussed with: CRNA  Anesthesia Plan Comments:          Anesthesia Quick Evaluation

## 2024-05-10 NOTE — Discharge Instructions (Addendum)
 Please discharge patient when stable, will follow up today with Dr. June Leap at the [facility] office immediately following discharge.  Leave shield in place until visit.  All paperwork with discharge instructions will be given at the office.  [facility] Address:  [address]  [place], [place] [address]

## 2024-05-10 NOTE — Anesthesia Postprocedure Evaluation (Signed)
 Anesthesia Post Note  Patient: DOIS JUARBE  Procedure(s) Performed: PHACOEMULSIFICATION, CATARACT, WITH IOL INSERTION (Left: Eye) INSERTION, STENT, DRUG-ELUTING, LACRIMAL CANALICULUS (Left)  Patient location during evaluation: PACU Anesthesia Type: General Level of consciousness: awake and alert Pain management: pain level controlled Vital Signs Assessment: post-procedure vital signs reviewed and stable Respiratory status: spontaneous breathing, nonlabored ventilation, respiratory function stable and patient connected to nasal cannula oxygen Cardiovascular status: stable and blood pressure returned to baseline Postop Assessment: no apparent nausea or vomiting Anesthetic complications: no   No notable events documented.   Last Vitals:  Vitals:   05/10/24 1058 05/10/24 1156  BP: (!) 150/91 (!) 159/98  Pulse:  93  Resp: 15 12  Temp: 36.4 C 36.5 C  SpO2: 98% 96%    Last Pain:  Vitals:   05/10/24 1156  TempSrc: Oral  PainSc: 0-No pain                 Andrea Limes

## 2024-05-12 DIAGNOSIS — I10 Essential (primary) hypertension: Secondary | ICD-10-CM | POA: Diagnosis not present

## 2024-05-12 DIAGNOSIS — Z23 Encounter for immunization: Secondary | ICD-10-CM | POA: Diagnosis not present

## 2024-05-12 DIAGNOSIS — Z86711 Personal history of pulmonary embolism: Secondary | ICD-10-CM | POA: Diagnosis not present

## 2024-05-12 DIAGNOSIS — B182 Chronic viral hepatitis C: Secondary | ICD-10-CM | POA: Diagnosis not present

## 2024-05-12 DIAGNOSIS — E785 Hyperlipidemia, unspecified: Secondary | ICD-10-CM | POA: Diagnosis not present

## 2024-05-12 DIAGNOSIS — E119 Type 2 diabetes mellitus without complications: Secondary | ICD-10-CM | POA: Diagnosis not present

## 2024-05-12 DIAGNOSIS — E559 Vitamin D deficiency, unspecified: Secondary | ICD-10-CM | POA: Diagnosis not present

## 2024-05-17 DIAGNOSIS — H25811 Combined forms of age-related cataract, right eye: Secondary | ICD-10-CM | POA: Diagnosis not present

## 2024-05-19 ENCOUNTER — Encounter (HOSPITAL_COMMUNITY)
Admission: RE | Admit: 2024-05-19 | Discharge: 2024-05-19 | Disposition: A | Discharge status: Home / Self Care | Source: Ambulatory Visit | Attending: Ophthalmology | Admitting: Ophthalmology

## 2024-05-19 NOTE — Progress Notes (Signed)
 Attempted pre op call but no answer.  Left a voicemail to call us  back.

## 2024-05-20 ENCOUNTER — Ambulatory Visit: Admitting: Nurse Practitioner

## 2024-05-20 ENCOUNTER — Encounter: Payer: Self-pay | Admitting: Nurse Practitioner

## 2024-05-21 ENCOUNTER — Encounter (HOSPITAL_COMMUNITY): Payer: Self-pay

## 2024-05-21 NOTE — H&P (Signed)
 Surgical History & Physical  Patient Name: Stephen Stephenson  DOB: 1951/01/26  Surgery: Cataract extraction with intraocular lens implant phacoemulsification; Right Eye Surgeon: Lynwood Hermann MD Surgery Date: 05/24/2024 Pre-Op Date: 05/17/2024  HPI: A 4 Yr. old male patient 1. The patient is returning after cataract surgery. The left eye is affected. Status post cataract surgery, which began 1 week ago: Since the last visit, the affected area feels improvement and is doing well. The patient's vision is improved. The condition's severity is constant. Patient is following medication instructions. 2. The patient is returning for a cataract follow-up of the right eye. Since the last visit, the affected area is tolerating. The patient's vision is blurry. Patient having trouble with glare while driving at night. The condition's severity is constant. This is negatively affecting the patient's quality of life and the patient is unable to function adequately in life with the current level of vision. HPI Completed by Dr. Lynwood Hermann  Medical History: Cataracts  Diabetes - DM Type 2 Heart Problem  Review of Systems Cardiovascular hx blood clots Endocrine DMII All recorded systems are negative except as noted above. Social   Never smoked / [id number]  Medication Ocular Systemic Prednisolone-moxiflox-bromfen,  Sodium bicarbonate , Glimepiride, Pantoprazole , Eliquis   Sx/Procedures Phaco c IOL OS,  Cholecystectomy  Drug Allergies  NKDA  History & Physical: Heent: cataract NECK: supple without bruits LUNGS: lungs clear to auscultation CV: regular rate and rhythm Abdomen: soft and non-tender  Impression & Plan: Assessment: 1.  CATARACT EXTRACTION STATUS; Left Eye (Z98.42) 2.  COMBINED FORMS AGE RELATED CATARACT; Right Eye (H25.811)  Plan: 1.  1 week after cataract surgery. Doing well with improved vision and normal eye pressure. Call with any problems or concerns. Continue Pred-Moxi-Brom  2x/day for 1 more week.  2.  Cataract accounts for the patient's decreased vision. This visual impairment is not correctable with a tolerable change in glasses or contact lenses. Cataract surgery with an implantation of a new lens should significantly improve the visual and functional status of the patient. Recommend phacoemulsification with intraocular lens. Discussed all risks, benefits, alternatives, and potential complications. Discussed the procedures and recovery. The patient desires to have surgery. A-scan/Biometry ordered and will be performed for intraocular lens calculations. The surgery will be performed in order to improve vision for driving, reading, and for eye examinations. Recommend Dextenza  for post-operative pain and inflammation. Educational materials provided: Cataract. History of corneal refractive Surgery: None History of Previous Ocular Surgery (PPV, other): None History of ocular trauma: None Use of Eye Pressure Lowering Drops: None Pupil Status: Dilates poorly - shugarcaine or Lidocaine +Omidira by protocol, Malyugin Ring Diabetes status: No Retinopathy Right Eye. Toric IOL OD.

## 2024-05-24 ENCOUNTER — Ambulatory Visit (HOSPITAL_COMMUNITY)
Admission: RE | Admit: 2024-05-24 | Discharge: 2024-05-24 | Disposition: A | Discharge status: Home / Self Care | Attending: Ophthalmology | Admitting: Ophthalmology

## 2024-05-24 ENCOUNTER — Ambulatory Visit (HOSPITAL_COMMUNITY): Admitting: Certified Registered"

## 2024-05-24 ENCOUNTER — Encounter (HOSPITAL_COMMUNITY)
Admission: RE | Disposition: A | Discharge status: Home / Self Care | Payer: Self-pay | Source: Home / Self Care | Attending: Ophthalmology

## 2024-05-24 ENCOUNTER — Encounter (HOSPITAL_COMMUNITY): Payer: Self-pay | Admitting: Ophthalmology

## 2024-05-24 DIAGNOSIS — Z9842 Cataract extraction status, left eye: Secondary | ICD-10-CM | POA: Diagnosis not present

## 2024-05-24 DIAGNOSIS — I10 Essential (primary) hypertension: Secondary | ICD-10-CM

## 2024-05-24 DIAGNOSIS — H5711 Ocular pain, right eye: Secondary | ICD-10-CM

## 2024-05-24 DIAGNOSIS — E1136 Type 2 diabetes mellitus with diabetic cataract: Secondary | ICD-10-CM | POA: Diagnosis not present

## 2024-05-24 DIAGNOSIS — H25811 Combined forms of age-related cataract, right eye: Secondary | ICD-10-CM | POA: Insufficient documentation

## 2024-05-24 DIAGNOSIS — Z87891 Personal history of nicotine dependence: Secondary | ICD-10-CM

## 2024-05-24 DIAGNOSIS — E119 Type 2 diabetes mellitus without complications: Secondary | ICD-10-CM | POA: Diagnosis not present

## 2024-05-24 HISTORY — PX: INSERTION, STENT, DRUG-ELUTING, LACRIMAL CANALICULUS: SHX7453

## 2024-05-24 HISTORY — PX: CATARACT EXTRACTION W/PHACO: SHX586

## 2024-05-24 LAB — GLUCOSE, CAPILLARY: Glucose-Capillary: 174 mg/dL — ABNORMAL HIGH (ref 70–99)

## 2024-05-24 SURGERY — PHACOEMULSIFICATION, CATARACT, WITH IOL INSERTION
Anesthesia: General | Site: Eye | Laterality: Right

## 2024-05-24 MED ORDER — PHENYLEPHRINE HCL 2.5 % OP SOLN
1.0000 [drp] | OPHTHALMIC | Status: AC | PRN
  Administered 2024-05-24 (×3): 1 [drp] via OPHTHALMIC

## 2024-05-24 MED ORDER — MIDAZOLAM HCL (PF) 2 MG/2ML IJ SOLN
INTRAMUSCULAR | Status: DC | PRN
  Administered 2024-05-24: 2 mg via INTRAVENOUS

## 2024-05-24 MED ORDER — LIDOCAINE HCL 3.5 % OP GEL: 1.0000 | Freq: Once | OPHTHALMIC | Status: DC

## 2024-05-24 MED ORDER — LIDOCAINE HCL (PF) 1 % IJ SOLN
INTRAMUSCULAR | Status: DC | PRN
Start: 2024-05-24 — End: 2024-05-24
  Administered 2024-05-24: 2 mL

## 2024-05-24 MED ORDER — MOXIFLOXACIN HCL 5 MG/ML IO SOLN
INTRAOCULAR | Status: DC | PRN
  Administered 2024-05-24: .2 mL via OPHTHALMIC

## 2024-05-24 MED ORDER — BSS IO SOLN
INTRAOCULAR | Status: DC | PRN
  Administered 2024-05-24: 15 mL via INTRAOCULAR

## 2024-05-24 MED ORDER — ONDANSETRON HCL 4 MG/2ML IJ SOLN: INTRAMUSCULAR | Status: DC | PRN

## 2024-05-24 MED ORDER — DEXAMETHASONE 0.4 MG OP INST
VAGINAL_INSERT | OPHTHALMIC | Status: AC
  Filled 2024-05-24: qty 1

## 2024-05-24 MED ORDER — POVIDONE-IODINE 5 % OP SOLN
OPHTHALMIC | Status: DC | PRN
  Administered 2024-05-24: 1 via OPHTHALMIC

## 2024-05-24 MED ORDER — LACTATED RINGERS IV SOLN: INTRAVENOUS | Status: DC

## 2024-05-24 MED ORDER — ONDANSETRON HCL 4 MG/2ML IJ SOLN
INTRAMUSCULAR | Status: DC | PRN
  Administered 2024-05-24: 4 mg via INTRAVENOUS

## 2024-05-24 MED ORDER — SODIUM HYALURONATE 10 MG/ML IO SOLUTION
PREFILLED_SYRINGE | INTRAOCULAR | Status: DC | PRN
  Administered 2024-05-24: .85 mL via INTRAOCULAR

## 2024-05-24 MED ORDER — PHENYLEPHRINE-KETOROLAC 1-0.3 % IO SOLN
INTRAOCULAR | Status: DC | PRN
  Administered 2024-05-24: 500 mL via OPHTHALMIC

## 2024-05-24 MED ORDER — SODIUM HYALURONATE 23MG/ML IO SOSY
PREFILLED_SYRINGE | INTRAOCULAR | Status: DC | PRN
  Administered 2024-05-24: .6 mL via INTRAOCULAR

## 2024-05-24 MED ORDER — TETRACAINE HCL 0.5 % OP SOLN
1.0000 [drp] | OPHTHALMIC | Status: AC | PRN
  Administered 2024-05-24 (×3): 1 [drp] via OPHTHALMIC

## 2024-05-24 MED ORDER — STERILE WATER FOR IRRIGATION IR SOLN
Status: DC | PRN
  Administered 2024-05-24: 250 mL

## 2024-05-24 MED ORDER — TROPICAMIDE 1 % OP SOLN
1.0000 [drp] | OPHTHALMIC | Status: AC | PRN
  Administered 2024-05-24 (×3): 1 [drp] via OPHTHALMIC

## 2024-05-24 MED ORDER — DEXAMETHASONE 0.4 MG OP INST
VAGINAL_INSERT | OPHTHALMIC | Status: DC | PRN
Start: 2024-05-24 — End: 2024-05-24
  Administered 2024-05-24: .4 mg via OPHTHALMIC

## 2024-05-24 MED ORDER — MIDAZOLAM HCL 2 MG/2ML IJ SOLN
INTRAMUSCULAR | Status: AC
  Filled 2024-05-24: qty 2

## 2024-05-24 SURGICAL SUPPLY — 11 items
CLOTH BEACON ORANGE TIMEOUT ST (SAFETY) ×1 IMPLANT
EYE SHIELD UNIVERSAL CLEAR (GAUZE/BANDAGES/DRESSINGS) IMPLANT
FEE CATARACT SUITE SIGHTPATH (MISCELLANEOUS) ×1 IMPLANT
GLOVE BIOGEL PI IND STRL 7.0 (GLOVE) ×2 IMPLANT
LENS IOL TECNIS EYHANCE 22.5 (Intraocular Lens) IMPLANT
NDL HYPO 18GX1.5 BLUNT FILL (NEEDLE) ×1 IMPLANT
NEEDLE HYPO 18GX1.5 BLUNT FILL (NEEDLE) ×1 IMPLANT
PAD ARMBOARD POSITIONER FOAM (MISCELLANEOUS) ×1 IMPLANT
SYR TB 1ML LL NO SAFETY (SYRINGE) ×1 IMPLANT
TAPE SURG TRANSPORE 1 IN (GAUZE/BANDAGES/DRESSINGS) IMPLANT
WATER STERILE IRR 250ML POUR (IV SOLUTION) ×1 IMPLANT

## 2024-05-24 NOTE — Op Note (Signed)
 Date of procedure: 05/24/24  Pre-operative diagnosis:  Visually significant combined form age-related cataract, Right Eye (H25.811)  Post-operative diagnosis:   1. Visually significant combined form age-related cataract, Right Eye (H25.811) 2. Pain and inflammation following cataract surgery Right Eye (H57.11)  Procedure:  Removal of cataract via phacoemulsification and insertion of intra-ocular lens [employer] DIB00 +22.5D into the capsular bag of the Right Eye 2. Placement of Dextenza  insert, Right Eye  Attending surgeon: Lynwood LABOR. Daviel Allegretto, MD, MA  Anesthesia: MAC, Topical Akten  Complications: None  Estimated Blood Loss: <27mL (minimal)  Specimens: None  Implants: As above  Indications:  Visually significant age-related cataract, Right Eye  Procedure:  The patient was seen and identified in the pre-operative area. The operative eye was identified and dilated.  The operative eye was marked.  Topical anesthesia was administered to the operative eye.     The patient was then to the operative suite and placed in the supine position.  A timeout was performed confirming the patient, procedure to be performed, and all other relevant information.   The patient's face was prepped and draped in the usual fashion for intra-ocular surgery.  A lid speculum was placed into the operative eye and the surgical microscope moved into place and focused.  A superotemporal paracentesis was created using a 20 gauge paracentesis blade. Omidria was injected into the anterior chamber. Shugarcaine was injected into the anterior chamber.  Viscoelastic was injected into the anterior chamber.  A temporal clear-corneal main wound incision was created using a 2.60mm microkeratome.  A continuous curvilinear capsulorrhexis was initiated using an irrigating cystitome and completed using capsulorrhexis forceps.  Hydrodissection and hydrodeliniation were performed.  Viscoelastic was injected into the anterior  chamber.  A phacoemulsification handpiece and a chopper as a second instrument were used to remove the nucleus and epinucleus. The irrigation/aspiration handpiece was used to remove any remaining cortical material.   The capsular bag was reinflated with viscoelastic, checked, and found to be intact.  The intraocular lens was inserted into the capsular bag.  The irrigation/aspiration handpiece was used to remove any remaining viscoelastic.  The clear corneal wound and paracentesis wounds were then hydrated and checked with Weck-Cels to be watertight. 0.1mL of moxifloxacin was injected into the anterior chamber.  The lid-speculum was removed. The lower punctum was dilated. A Dextenza  implant was placed in the lower canaliculus without complication.  The drape was removed.  The patient's face was cleaned with a wet and dry 4x4. A clear shield was taped over the eye. The patient was taken to the post-operative care unit in good condition, having tolerated the procedure well.  Post-Op Instructions: The patient will follow up at [facility] for a same day post-operative evaluation and will receive all other orders and instructions.

## 2024-05-24 NOTE — Discharge Instructions (Addendum)
 Please discharge patient when stable, will follow up today with Dr. June Leap at the [facility] office immediately following discharge.  Leave shield in place until visit.  All paperwork with discharge instructions will be given at the office.  [facility] Address:  [address]  [place], [place] [address]

## 2024-05-24 NOTE — Transfer of Care (Signed)
 Immediate Anesthesia Transfer of Care Note  Patient: Stephen Stephenson  Procedure(s) Performed: PHACOEMULSIFICATION, CATARACT, WITH IOL INSERTION (Right: Eye) INSERTION, STENT, DRUG-ELUTING, LACRIMAL CANALICULUS (Right: Eye)  Patient Location: PACU  Anesthesia Type:MAC  Level of Consciousness: awake, alert , and oriented  Airway & Oxygen Therapy: Patient Spontanous Breathing  Post-op Assessment: Report given to RN and Post -op Vital signs reviewed and stable  Post vital signs: Reviewed and stable  Last Vitals:  Vitals Value Taken Time  BP    Temp    Pulse    Resp    SpO2      Last Pain:  Vitals:   05/24/24 0748  TempSrc: Oral  PainSc: 0-No pain      Patients Stated Pain Goal: 6 (05/24/24 0748)  Complications: No notable events documented.

## 2024-05-24 NOTE — Anesthesia Preprocedure Evaluation (Signed)
 Anesthesia Evaluation  Patient identified by MRN, date of birth, ID band Patient awake    Reviewed: Allergy & Precautions, H&P , NPO status , Patient's Chart, lab work & pertinent test results  Airway Mallampati: II  TM Distance: >3 FB Neck ROM: Full    Dental  (+) Upper Dentures   Pulmonary former smoker   Pulmonary exam normal breath sounds clear to auscultation       Cardiovascular hypertension, Normal cardiovascular exam Rhythm:Regular Rate:Tachycardia  Latest EKG shows bigeminy   Neuro/Psych negative neurological ROS  negative psych ROS   GI/Hepatic negative GI ROS, Neg liver ROS,,,  Endo/Other  diabetes    Renal/GU negative Renal ROS  negative genitourinary   Musculoskeletal negative musculoskeletal ROS (+)    Abdominal   Peds negative pediatric ROS (+)  Hematology negative hematology ROS (+)   Anesthesia Other Findings   Reproductive/Obstetrics negative OB ROS                              Anesthesia Physical Anesthesia Plan  ASA: 3  Anesthesia Plan: General   Post-op Pain Management:    Induction: Intravenous  PONV Risk Score and Plan:   Airway Management Planned: Nasal Cannula  Additional Equipment:   Intra-op Plan:   Post-operative Plan:   Informed Consent: I have reviewed the patients History and Physical, chart, labs and discussed the procedure including the risks, benefits and alternatives for the proposed anesthesia with the patient or authorized representative who has indicated his/her understanding and acceptance.     Dental advisory given  Plan Discussed with: CRNA  Anesthesia Plan Comments:          Anesthesia Quick Evaluation

## 2024-05-24 NOTE — Interval H&P Note (Signed)
 History and Physical Interval Note:  05/24/2024 8:23 AM  Stephen Stephenson  has presented today for surgery, with the diagnosis of combined forms age related cataract, right eye.  The various methods of treatment have been discussed with the patient and family. After consideration of risks, benefits and other options for treatment, the patient has consented to  Procedure(s) with comments: PHACOEMULSIFICATION, CATARACT, WITH IOL INSERTION (Right) - CDE: INSERTION, STENT, DRUG-ELUTING, LACRIMAL CANALICULUS (Right) as a surgical intervention.  The patient's history has been reviewed, patient examined, no change in status, stable for surgery.  I have reviewed the patient's chart and labs.  Questions were answered to the patient's satisfaction.     HARRIE AGENT

## 2024-05-25 ENCOUNTER — Encounter (HOSPITAL_COMMUNITY): Payer: Self-pay | Admitting: Ophthalmology

## 2024-05-25 NOTE — Anesthesia Postprocedure Evaluation (Signed)
 Anesthesia Post Note  Patient: Stephen Stephenson  Procedure(s) Performed: PHACOEMULSIFICATION, CATARACT, WITH IOL INSERTION (Right: Eye) INSERTION, STENT, DRUG-ELUTING, LACRIMAL CANALICULUS (Right: Eye)  Patient location during evaluation: Phase II Anesthesia Type: General Level of consciousness: awake Pain management: pain level controlled Vital Signs Assessment: post-procedure vital signs reviewed and stable Respiratory status: spontaneous breathing and respiratory function stable Cardiovascular status: blood pressure returned to baseline and stable Postop Assessment: no headache and no apparent nausea or vomiting Anesthetic complications: no Comments: Late entry   No notable events documented.   Last Vitals:  Vitals:   05/24/24 0748 05/24/24 0851  BP: (!) 150/91 (!) 142/84  Pulse: 88 92  Resp: (!) 80 14  Temp: 36.4 C 36.6 C  SpO2: 95% 96%    Last Pain:  Vitals:   05/24/24 0851  TempSrc: Oral  PainSc: 0-No pain                 Yvonna JINNY Bosworth

## 2024-05-27 ENCOUNTER — Other Ambulatory Visit: Payer: Self-pay

## 2024-06-22 ENCOUNTER — Ambulatory Visit: Admitting: Nurse Practitioner

## 2024-06-22 ENCOUNTER — Encounter: Payer: Self-pay | Admitting: Nurse Practitioner

## 2024-06-22 NOTE — Progress Notes (Unsigned)
   Subjective:  Follow-up    HPI: Stephen Stephenson is a 73 y.o. male presenting on 06/22/2024 with report of ***  Sleep apnea negative  Take medicatiosn at 10 pm in bed by 10:30-11 pm  ROS: Negative unless specifically indicated above in HPI.   Relevant past medical history reviewed and updated as indicated.   Allergies and medications reviewed and updated.   Current Outpatient Medications  Medication Instructions  . apixaban  (ELIQUIS ) 2.5 mg, 2 times daily  . cloNIDine HCl (KAPVAY) 0.1 mg, Daily at bedtime  . dicyclomine  (BENTYL ) 10 mg, Oral, 3 times daily before meals & bedtime, For abdominal cramping and looser stool  . Fish Oil 1,000 mg, Daily  . glimepiride (AMARYL) 2-4 mg, 2 times daily  . metoprolol  succinate (TOPROL -XL) 25 mg, Oral, Daily  . olmesartan (BENICAR) 20 mg, Daily  . Pancrelipase , Lip-Prot-Amyl, (ZENPEP ) 60000-189600 units CPEP 2 tablets, 3 times daily  . pantoprazole  (PROTONIX ) 40 MG tablet Take 1 tablet by mouth once daily  . simethicone  (GAS-X) 80 mg, Oral, Every 6 hours PRN  . sodium bicarbonate  650 mg, Oral, 2 times daily  . tamsulosin  (FLOMAX ) 0.4 mg, Oral, Daily after supper  . TRIJARDY XR 5-2.11-998 MG TB24 1 tablet, 2 times daily  . Vitamin D (Ergocalciferol) (DRISDOL) 50,000 Units, Every 7 days     No Known Allergies  Objective:   BP 132/82 (BP Location: Right Arm, Patient Position: Sitting, Cuff Size: Normal)   Pulse 96   Resp 20   Wt 208 lb (94.3 kg)   SpO2 95%   BMI 28.21 kg/m    Physical Exam Constitutional:      Appearance: Normal appearance.  HENT:     Head: Normocephalic.  Eyes:     Conjunctiva/sclera: Conjunctivae normal.  Cardiovascular:     Rate and Rhythm: Normal rate and regular rhythm.  Pulmonary:     Effort: Pulmonary effort is normal.     Breath sounds: Normal breath sounds.  Skin:    General: Skin is warm and dry.  Neurological:     General: No focal deficit present.     Mental Status: He is alert and oriented  to person, place, and time.  Psychiatric:        Mood and Affect: Mood normal.        Behavior: Behavior normal.        Thought Content: Thought content normal.        Judgment: Judgment normal.     Eye Contact:  {BHH EYE CONTACT:22684}  Speech:  {Speech:22685}  Volume:  {Volume (PAA):22686}  Mood:  {BHH MOOD:22306}  Affect:  {Affect (PAA):22687}  Thought Process:  {Thought Process (PAA):22688}  Orientation:  {BHH ORIENTATION (PAA):22689}  Thought Content:  {Thought Content:22690}  Suicidal Thoughts:  {ST/HT (PAA):22692}  Homicidal Thoughts:  {ST/HT (PAA):22692}  Memory:  {BHH MEMORY:22881}  Judgement:  {Judgement (PAA):22694}  Insight:  {Insight (PAA):22695}  Psychomotor Activity:  {Psychomotor (PAA):22696}  Concentration:  {Concentration:21399}  Recall:  {BHH GOOD/FAIR/POOR:22877}  Fund of Knowledge:  {BHH GOOD/FAIR/POOR:22877}  Language:  {BHH GOOD/FAIR/POOR:22877}  Akathisia:  {BHH YES OR NO:22294}  Handed:  {Handed:22697}  AIMS (if indicated):     Assets:  {Assets (PAA):22698}  ADL's:  {BHH JIO'D:77709}  Cognition:  {chl bhh cognition:304700322}  Sleep:        Assessment & Plan:   Assessment & Plan     Follow up plan: No follow-ups on file.  Florencia Cousin, NP

## 2024-07-21 ENCOUNTER — Ambulatory Visit: Admitting: Gastroenterology

## 2024-07-21 ENCOUNTER — Encounter: Payer: Self-pay | Admitting: Gastroenterology

## 2024-07-21 ENCOUNTER — Telehealth (INDEPENDENT_AMBULATORY_CARE_PROVIDER_SITE_OTHER): Payer: Self-pay

## 2024-07-21 VITALS — BP 138/84 | HR 98 | Temp 97.3°F | Ht 72.0 in | Wt 210.5 lb

## 2024-07-21 DIAGNOSIS — R630 Anorexia: Secondary | ICD-10-CM | POA: Insufficient documentation

## 2024-07-21 DIAGNOSIS — R197 Diarrhea, unspecified: Secondary | ICD-10-CM

## 2024-07-21 DIAGNOSIS — R1031 Right lower quadrant pain: Secondary | ICD-10-CM

## 2024-07-21 DIAGNOSIS — R1030 Lower abdominal pain, unspecified: Secondary | ICD-10-CM | POA: Diagnosis not present

## 2024-07-21 MED ORDER — HYOSCYAMINE SULFATE 0.125 MG PO TABS: 0.1250 mg | ORAL_TABLET | ORAL | 1 refills | Status: AC | PRN

## 2024-07-21 NOTE — Telephone Encounter (Signed)
 ATC pt and give CT appointment for 1/9, arriving at 7:30 to drink contrast. Patient did not answer, LVM.

## 2024-07-21 NOTE — Patient Instructions (Signed)
 I have sent in Levsin  to take every 4-6 hours for cramping and loose stool.  We are arranging a CT scan as soon as possible. Don't take Trijardy the day of the scan and for 48 hours after the scan.  Please have blood work done.   We may ultimately need to do a colonoscopy. We will see what the labs and CT shows!  Further recommendations to follow.  Happy early birthday!  I enjoyed seeing you again today! I value our relationship and want to provide genuine, compassionate, and quality care. You may receive a survey regarding your visit with me, and I welcome your feedback! Thanks so much for taking the time to complete this. I look forward to seeing you again.      Therisa MICAEL Stager, PhD, ANP-BC [facility]

## 2024-07-21 NOTE — Telephone Encounter (Signed)
 [employer] does not require prior authorization for CT.

## 2024-07-21 NOTE — Progress Notes (Signed)
 "   Gastroenterology Office Note     Primary Care Physician:  Marvine Rush, MD  Primary Gastroenterologist: Dr Shaaron    Chief Complaint   Chief Complaint  Patient presents with   Follow-up    Patient here today due to having upper and lower abdominal pain on both side. He is also having issues with diarrhea, He has 4-5 loose stools per day and gas pains, belching, that smell bad. He is taking pantoprazole  40 mg once per day. He is not taking zenpep  as he says this did not help, and he is not taking Bentyl . He is taking imodium around 4 capsules daily.      History of Present Illness   Stephen Stephenson is a 74 y.o. male presenting today with a history of Hep C s/p eradication and increased fibrosis in past (Metavir 3) now with serial ultrasounds, chronic GERD, intermittent, chronic diarrhea with improvement on PERT in the past, last seen in Oct 2025.  At last visit, he noted Zenpep  was not working as well. Celiac serologies negative in 2015. Xifaxan  and dicyclomine  without improvement in the past. GI profile and Cdiff were negative in Oct 2025. CRP and sed rate were normal. Fecal calprotectin normal. Updated celiac panel negative. Recommended starting dicyclomine . No improvement with dicyclomine . Sometimes will have pain after eating. Appetite is gone. Poor appetite. Last 2 days ate one hotdog, one ham sandwich, lots of fluids. Doesn't notice that greasy food worsens symptoms.   He notes imodium is helpful to decrease frequency on a bad day. Will take 4 caplets. Intermittent abdominal pain noted. Will have RLQ discomfort intermittently and lower abdomen.   History of diarrhea and stopped metformin as this was causing issues. Trijardy without any issues.   Can't leave the house sometimes. Due to frequency and urgency. Still with gas, abdominal discomfort. Zenpep  quit working but had helped at one point. Some days will have normal BM. Maybe have 2 good days out of the week.   Will have  problems of getting bloated and gas. Has had incontinence if thinks was gas but instead diarrhea. Postprandial urgency with 4-5 loose stools on the bad days. Sometimes watery, sometimes clumps and water .   No change with lactose-free diet.   EGD March 2025: normal esophagus, portal gastropathy, normal duodenum.    Colonoscopy up-to-date as of 2021 and no further colonoscopy recommended unless new symptoms develop.    single 3 mm polyp at the rectosigmoid colon and a 7 mm innocent appearing AVM at the base of the cecum, otherwise normal.  Surgical pathology found the polyp to be hyperplastic.  Recommended no future colonoscopy unless new symptoms develop.    Past Surgical History:  Procedure Laterality Date   BACK SURGERY     CATARACT EXTRACTION W/PHACO Left 05/10/2024   Procedure: PHACOEMULSIFICATION, CATARACT, WITH IOL INSERTION;  Surgeon: Harrie Agent, MD;  Location: [facility];  Service: Ophthalmology;  Laterality: Left;  CDE  11.31   CATARACT EXTRACTION W/PHACO Right 05/24/2024   Procedure: PHACOEMULSIFICATION, CATARACT, WITH IOL INSERTION;  Surgeon: Harrie Agent, MD;  Location: [facility];  Service: Ophthalmology;  Laterality: Right;  CDE: 5.97   CHOLECYSTECTOMY     COLONOSCOPY  02/05/2002   anal canal herrhoids ow normal/normal ileum/suspect pt bled from hemorrhoids presently   COLONOSCOPY  09/27/2011   MFM:[id number] hemorrhoids. Hepatic flexure polyp-removed TUBULAR ADENOMA, due for surveillance March 2016   COLONOSCOPY N/A 01/20/2015   MFM:[email or web address] colonic polyp removed/colonic diverticulosis   COLONOSCOPY N/A 07/05/2020  single 3 mm polyp at the rectosigmoid colon and a 7 mm innocent appearing AVM at the base of the cecum, otherwise normal.  Surgical pathology found the polyp to be hyperplastic.  Recommended no future colonoscopy unless new symptoms develop.   CYSTOSCOPY W/ URETERAL STENT PLACEMENT Left 02/03/2013   Procedure: CYSTOSCOPY WITH RETROGRADE PYELOGRAM/URETERAL STENT  PLACEMENT;  Surgeon: Emery LILLETTE Blaze, MD;  Location: [facility];  Service: Urology;  Laterality: Left;   CYSTOSCOPY/URETEROSCOPY/HOLMIUM LASER/STENT PLACEMENT Left 11/11/2023   Procedure: CYSTOSCOPY/URETEROSCOPY/HOLMIUM LASER/STENT PLACEMENT;  Surgeon: Sherrilee Belvie CROME, MD;  Location: [facility];  Service: Urology;  Laterality: Left;   ESOPHAGOGASTRODUODENOSCOPY  05/17/2005   normal esophagus/gastric mucosa appeared normal   ESOPHAGOGASTRODUODENOSCOPY N/A 04/25/2014   Abnormal gastric mucosa, benign biopsies. No esophageal varices. Next EGD October 2018.   ESOPHAGOGASTRODUODENOSCOPY (EGD) WITH PROPOFOL  N/A 01/08/2018   nonerosive reflux esophagitis status post Maloney dilation, portal hypertensive gastropathy, normal duodenum. Surveillance was due 2021   ESOPHAGOGASTRODUODENOSCOPY (EGD) WITH PROPOFOL  N/A 09/24/2023   Procedure: ESOPHAGOGASTRODUODENOSCOPY (EGD) WITH PROPOFOL ;  Surgeon: Shaaron Lamar HERO, MD;  [facility]: [facility];  Service: Endoscopy;  Laterality: N/A;  10:45 am, asa 3   EXTRACORPOREAL SHOCK WAVE LITHOTRIPSY Left 02/03/2013   Procedure: EXTRACORPOREAL SHOCK WAVE LITHOTRIPSY (ESWL) LEFT RENAL CALCULUS;  Surgeon: Emery LILLETTE Blaze, MD;  Location: [facility];  Service: Urology;  Laterality: Left;   EXTRACORPOREAL SHOCK WAVE LITHOTRIPSY Left 10/14/2023   Procedure: LITHOTRIPSY, ESWL;  Surgeon: Sherrilee Belvie CROME, MD;  Location: [facility];  Service: Urology;  Laterality: Left;   HOLMIUM LASER APPLICATION Left 11/11/2023   Procedure: HOLMIUM LASER APPLICATION;  Surgeon: Sherrilee Belvie CROME, MD;  Location: [facility];  Service: Urology;  Laterality: Left;   INSERTION, STENT, DRUG-ELUTING, LACRIMAL CANALICULUS Left 05/10/2024   Procedure: INSERTION, STENT, DRUG-ELUTING, LACRIMAL CANALICULUS;  Surgeon: Harrie Agent, MD;  Location: [facility];  Service: Ophthalmology;  Laterality: Left;   INSERTION, STENT, DRUG-ELUTING, LACRIMAL CANALICULUS Right 05/24/2024   Procedure: INSERTION, STENT, DRUG-ELUTING,  LACRIMAL CANALICULUS;  Surgeon: Harrie Agent, MD;  Location: [facility];  Service: Ophthalmology;  Laterality: Right;   KIDNEY STONE SURGERY     MALONEY DILATION N/A 01/08/2018   Procedure: AGAPITO DILATION;  Surgeon: Shaaron Lamar HERO, MD;  Location: [facility];  Service: Endoscopy;  Laterality: N/A;   POLYPECTOMY  07/05/2020   Procedure: POLYPECTOMY;  Surgeon: Shaaron Lamar HERO, MD;  Location: [facility];  Service: Endoscopy;;  recto-sigmoid    SHOULDER SURGERY Right     Current Outpatient Medications  Medication Sig Dispense Refill   apixaban  (ELIQUIS ) 2.5 MG TABS tablet Take 2.5 mg by mouth 2 (two) times daily.     cloNIDine HCl (KAPVAY) 0.1 MG TB12 ER tablet Take 0.1 mg by mouth at bedtime.     glimepiride (AMARYL) 4 MG tablet Take 2-4 mg by mouth 2 (two) times daily.     metoprolol  succinate (TOPROL -XL) 25 MG 24 hr tablet Take 1 tablet (25 mg total) by mouth daily. 30 tablet 2   olmesartan (BENICAR) 40 MG tablet Take 20 mg by mouth daily.     Omega-3 Fatty Acids (FISH OIL) 1000 MG CAPS Take 1,000 mg by mouth daily.     pantoprazole  (PROTONIX ) 40 MG tablet Take 1 tablet by mouth once daily 90 tablet 3   simethicone  (GAS-X) 80 MG chewable tablet Chew 1 tablet (80 mg total) by mouth every 6 (six) hours as needed for flatulence. 30 tablet 0   sodium bicarbonate  650 MG tablet Take 1 tablet (  650 mg total) by mouth 2 (two) times daily. 60 tablet 11   TRIJARDY XR 5-2.11-998 MG TB24 Take 1 tablet by mouth 2 (two) times daily.     Vitamin D, Ergocalciferol, (DRISDOL) 1.25 MG (50000 UNIT) CAPS capsule Take 50,000 Units by mouth every 7 (seven) days.     dicyclomine  (BENTYL ) 10 MG capsule Take 1 capsule (10 mg total) by mouth 4 (four) times daily -  before meals and at bedtime. For abdominal cramping and looser stool (Patient not taking: Reported on 07/21/2024) 90 capsule 1   Pancrelipase , Lip-Prot-Amyl, (ZENPEP ) 60000-189600 units CPEP Take 2 tablets by mouth 3 (three) times daily. . Gets#360 for 90  days. Gets through patient assistance. (Patient not taking: Reported on 07/21/2024)     tamsulosin  (FLOMAX ) 0.4 MG CAPS capsule Take 1 capsule (0.4 mg total) by mouth daily after supper. (Patient not taking: Reported on 07/21/2024) 30 capsule 0   No current facility-administered medications for this visit.    Allergies as of 07/21/2024   (No Known Allergies)    Family History  Problem Relation Age of Onset   Liver disease Father        ? Hepatitis C; Alcoholic   Colon cancer Neg Hx     Social History   Socioeconomic History   Marital status: Married    Spouse name: Not on file   Number of children: Not on file   Years of education: Not on file   Highest education level: Not on file  Occupational History   Not on file  Tobacco Use   Smoking status: Former    Current packs/day: 0.00    Average packs/day: 1 pack/day for 25.0 years (25.0 ttl pk-yrs)    Types: Cigarettes    Start date: 08/01/1970    Quit date: 08/02/1995    Years since quitting: 28.9   Smokeless tobacco: Former    Quit date: 10/12/1992  Vaping Use   Vaping status: Never Used  Substance and Sexual Activity   Alcohol use: Not Currently   Drug use: No   Sexual activity: Yes    Birth control/protection: None  Other Topics Concern   Not on file  Social History Narrative   ** Merged History Encounter **       Social Drivers of Health   Tobacco Use: Medium Risk (07/21/2024)   Patient History    Smoking Tobacco Use: Former    Smokeless Tobacco Use: Former    Passive Exposure: Not on Actuary Strain: Not on file  Food Insecurity: No Food Insecurity (02/18/2022)   Hunger Vital Sign    Worried About Running Out of Food in the Last Year: Never true    Ran Out of Food in the Last Year: Never true  Transportation Needs: No Transportation Needs (02/18/2022)   PRAPARE - Administrator, Civil Service (Medical): No    Lack of Transportation (Non-Medical): No  Physical Activity: Not on file   Stress: Not on file  Social Connections: Not on file  Intimate Partner Violence: Not on file  Depression (EYV7-0): Not on file  Alcohol Screen: Not on file  Housing: Low Risk (02/18/2022)   Housing    Last Housing Risk Score: 0  Utilities: Not on file  Health Literacy: Not on file     Review of Systems   See HPI   Physical Exam   BP 138/84 (BP Location: Left Arm, Patient Position: Sitting, Cuff Size: Normal)   Pulse 98  Temp (!) 97.3 F (36.3 C) (Temporal)   Ht 6' (1.829 m)   Wt 210 lb 8 oz (95.5 kg)   BMI 28.55 kg/m  General:   Alert and oriented. Pleasant and cooperative. Well-nourished and well-developed.  Head:  Normocephalic and atraumatic. Eyes:  Without icterus Abdomen:  +BS, distended and round but soft, likely ventral hernia, +BS Rectal:  Deferred  Msk:  Symmetrical without gross deformities. Normal posture. Extremities:  Without edema. Neurologic:  Alert and  oriented x4;  grossly normal neurologically. Skin:  Intact without significant lesions or rashes. Psych:  Alert and cooperative. Normal mood and affect.   Assessment   Stephen Stephenson is a 74 y.o. male presenting today with a history of Hep C s/p eradication and increased fibrosis in past (Metavir 3) now with serial ultrasounds, chronic GERD, intermittent, chronic diarrhea with improvement on PERT in the past, last seen in Oct 2025, persistent diarrhea now noted.  As above, Zenpep  previously noted improvement, prior evaluation with negative Celiac serologies, negative stool studies, inflammatory markers (CRP, sed rate ,fecal cal) all normal. Weight has been stable and fluctuates at baseline. No food aversions or fear of eating. Does not seem entirely typical of CMI but with lower abdominal pain new since last time, will update CT abd/pelvis with renal contrast now. Differentials including med-effect (Trijardy), SIBO, need to rule out alpha gal syndrome as could explain food triggers, likely does have  underlying element of EPI, +/- EPI, ultimately may need colonoscopy with random colonic biopsies as discussed previously.   Notably: we did not discuss prior history of fibrosis today as focused on worsening diarrhea. Will need to discuss this in future at next visit.    PLAN    Trial of Levsin  CT abd/pelvis with renal dose contrast Consider another course of Xifaxan  Check CMP, alpha gal May ultimately need colonoscopy Consider trial off Trijardy   Therisa MICAEL Stager, PhD, ANP-BC [facility]    "

## 2024-07-22 NOTE — Telephone Encounter (Signed)
 Pt left VM stating he has not heard anything regarding CT but see appointment on mychart and wants to confirm arrival time.  Called pt back, LMOVM. Also sent mychart.

## 2024-07-23 ENCOUNTER — Ambulatory Visit: Admitting: Urology

## 2024-07-23 ENCOUNTER — Ambulatory Visit (HOSPITAL_COMMUNITY)
Admission: RE | Admit: 2024-07-23 | Discharge: 2024-07-23 | Disposition: A | Source: Ambulatory Visit | Attending: Gastroenterology | Admitting: Gastroenterology

## 2024-07-23 ENCOUNTER — Ambulatory Visit (HOSPITAL_COMMUNITY)
Admission: RE | Admit: 2024-07-23 | Discharge: 2024-07-23 | Disposition: A | Source: Ambulatory Visit | Attending: Urology

## 2024-07-23 DIAGNOSIS — R1031 Right lower quadrant pain: Secondary | ICD-10-CM

## 2024-07-23 DIAGNOSIS — N200 Calculus of kidney: Secondary | ICD-10-CM | POA: Insufficient documentation

## 2024-07-23 MED ORDER — IOHEXOL 300 MG/ML  SOLN
100.0000 mL | Freq: Once | INTRAMUSCULAR | Status: AC | PRN
  Administered 2024-07-23: 100 mL via INTRAVENOUS

## 2024-07-25 LAB — COMPREHENSIVE METABOLIC PANEL WITH GFR
ALT: 31 IU/L (ref 0–44)
AST: 32 IU/L (ref 0–40)
Albumin: 4.2 g/dL (ref 3.8–4.8)
Alkaline Phosphatase: 48 IU/L (ref 47–123)
BUN/Creatinine Ratio: 15 (ref 10–24)
BUN: 18 mg/dL (ref 8–27)
Bilirubin Total: 0.4 mg/dL (ref 0.0–1.2)
CO2: 22 mmol/L (ref 20–29)
Calcium: 10.1 mg/dL (ref 8.6–10.2)
Chloride: 102 mmol/L (ref 96–106)
Creatinine, Ser: 1.23 mg/dL (ref 0.76–1.27)
Globulin, Total: 2.6 g/dL (ref 1.5–4.5)
Glucose: 165 mg/dL — ABNORMAL HIGH (ref 70–99)
Potassium: 4.6 mmol/L (ref 3.5–5.2)
Sodium: 141 mmol/L (ref 134–144)
Total Protein: 6.8 g/dL (ref 6.0–8.5)
eGFR: 62 mL/min/1.73

## 2024-07-25 LAB — ALPHA-GAL PANEL
Allergen Lamb IgE: 3.94 kU/L — AB
Beef IgE: 11.2 kU/L — AB
IgE (Immunoglobulin E), Serum: 691 [IU]/mL — AB (ref 6–495)
O215-IgE Alpha-Gal: 30.9 kU/L — AB
Pork IgE: 5.29 kU/L — AB

## 2024-08-03 ENCOUNTER — Ambulatory Visit: Payer: Self-pay | Admitting: Urology

## 2024-08-03 ENCOUNTER — Ambulatory Visit: Payer: Self-pay | Admitting: Gastroenterology

## 2024-08-03 MED ORDER — EPINEPHRINE 0.3 MG/0.3ML IJ SOAJ: 0.3000 mg | INTRAMUSCULAR | 2 refills | Status: AC | PRN

## 2024-08-05 ENCOUNTER — Ambulatory Visit: Admitting: Nurse Practitioner

## 2024-08-17 ENCOUNTER — Telehealth: Admitting: Nurse Practitioner

## 2024-08-17 NOTE — Telephone Encounter (Signed)
 Pt needs appt to discuss labs / food allergies.

## 2024-08-17 NOTE — Progress Notes (Unsigned)
" ° °  Subjective:  No chief complaint on file.    HPI: Stephen Stephenson is a 74 y.o. male presenting on 08/17/2024 with report of ***    ROS: Negative unless specifically indicated above in HPI.   Relevant past medical history reviewed and updated as indicated.   Allergies and medications reviewed and updated.   Current Outpatient Medications  Medication Instructions   apixaban  (ELIQUIS ) 2.5 mg, 2 times daily   cloNIDine HCl (KAPVAY) 0.1 mg, Daily at bedtime   EPINEPHrine  (EPI-PEN) 0.3 mg, Intramuscular, As needed   Fish Oil 1,000 mg, Daily   glimepiride (AMARYL) 2-4 mg, 2 times daily   hyoscyamine  (LEVSIN ) 0.125 mg, Oral, Every 4 hours PRN   metoprolol  succinate (TOPROL -XL) 25 mg, Oral, Daily   olmesartan (BENICAR) 20 mg, Daily   pantoprazole  (PROTONIX ) 40 MG tablet Take 1 tablet by mouth once daily   simethicone  (GAS-X) 80 mg, Oral, Every 6 hours PRN   sodium bicarbonate  650 mg, Oral, 2 times daily   TRIJARDY XR 5-2.11-998 MG TB24 1 tablet, 2 times daily   Vitamin D (Ergocalciferol) (DRISDOL) 50,000 Units, Every 7 days     Allergies[1]  Objective:   There were no vitals taken for this visit.   Physical Exam Constitutional:      Appearance: Normal appearance.  HENT:     Head: Normocephalic.  Eyes:     Conjunctiva/sclera: Conjunctivae normal.  Cardiovascular:     Rate and Rhythm: Normal rate and regular rhythm.  Pulmonary:     Effort: Pulmonary effort is normal.     Breath sounds: Normal breath sounds.  Skin:    General: Skin is warm and dry.  Neurological:     General: No focal deficit present.     Mental Status: He is alert and oriented to person, place, and time.  Psychiatric:        Mood and Affect: Mood normal.        Behavior: Behavior normal.        Thought Content: Thought content normal.        Judgment: Judgment normal.     Eye Contact:  {BHH EYE CONTACT:22684}  Speech:  {Speech:22685}  Volume:  {Volume (PAA):22686}  Mood:  {BHH  MOOD:22306}  Affect:  {Affect (PAA):22687}  Thought Process:  {Thought Process (PAA):22688}  Orientation:  {BHH ORIENTATION (PAA):22689}  Thought Content:  {Thought Content:22690}  Suicidal Thoughts:  {ST/HT (PAA):22692}  Homicidal Thoughts:  {ST/HT (PAA):22692}  Memory:  {BHH MEMORY:22881}  Judgement:  {Judgement (PAA):22694}  Insight:  {Insight (PAA):22695}  Psychomotor Activity:  {Psychomotor (PAA):22696}  Concentration:  {Concentration:21399}  Recall:  {BHH GOOD/FAIR/POOR:22877}  Fund of Knowledge:  {BHH GOOD/FAIR/POOR:22877}  Language:  {BHH GOOD/FAIR/POOR:22877}  Akathisia:  {BHH YES OR NO:22294}  Handed:  {Handed:22697}  AIMS (if indicated):     Assets:  {Assets (PAA):22698}  ADL's:  {BHH JIO'D:77709}  Cognition:  {chl bhh cognition:304700322}  Sleep:        Assessment & Plan:   Assessment & Plan     Follow up plan: No follow-ups on file.  Florencia Cousin, NP       [1] No Known Allergies "

## 2024-08-19 NOTE — Telephone Encounter (Signed)
 noted

## 2024-09-09 ENCOUNTER — Ambulatory Visit: Admitting: Nurse Practitioner

## 2024-10-14 ENCOUNTER — Ambulatory Visit: Admitting: Gastroenterology
